# Patient Record
Sex: Male | Born: 1955 | Race: Black or African American | Hispanic: No | Marital: Single | Smoking: Never smoker
Health system: Southern US, Community
[De-identification: ages and names within clinical notes are randomized; demographics above are authoritative.]

## PROBLEM LIST (undated history)

## (undated) DIAGNOSIS — I4510 Unspecified right bundle-branch block: Secondary | ICD-10-CM

## (undated) DIAGNOSIS — E119 Type 2 diabetes mellitus without complications: Secondary | ICD-10-CM

## (undated) DIAGNOSIS — E041 Nontoxic single thyroid nodule: Secondary | ICD-10-CM

## (undated) DIAGNOSIS — I10 Essential (primary) hypertension: Secondary | ICD-10-CM

## (undated) DIAGNOSIS — K5731 Diverticulosis of large intestine without perforation or abscess with bleeding: Secondary | ICD-10-CM

## (undated) DIAGNOSIS — D125 Benign neoplasm of sigmoid colon: Secondary | ICD-10-CM

## (undated) DIAGNOSIS — E785 Hyperlipidemia, unspecified: Secondary | ICD-10-CM

## (undated) DIAGNOSIS — K2100 Gastro-esophageal reflux disease with esophagitis, without bleeding: Secondary | ICD-10-CM

## (undated) DIAGNOSIS — K222 Esophageal obstruction: Secondary | ICD-10-CM

## (undated) HISTORY — DX: Unspecified right bundle-branch block: I45.10

## (undated) HISTORY — DX: Diverticulosis of large intestine without perforation or abscess with bleeding: K57.31

## (undated) HISTORY — PX: MELANOMA EXCISION: SHX5266

## (undated) HISTORY — DX: Hyperlipidemia, unspecified: E78.5

## (undated) HISTORY — DX: Esophageal obstruction: K22.2

## (undated) HISTORY — PX: WISDOM TOOTH EXTRACTION: SHX21

## (undated) HISTORY — DX: Benign neoplasm of sigmoid colon: D12.5

## (undated) HISTORY — DX: Gastro-esophageal reflux disease with esophagitis, without bleeding: K21.00

## (undated) HISTORY — DX: Type 2 diabetes mellitus without complications: E11.9

## (undated) HISTORY — DX: Nontoxic single thyroid nodule: E04.1

## (undated) HISTORY — DX: Essential (primary) hypertension: I10

---

## 2010-03-10 ENCOUNTER — Other Ambulatory Visit: Payer: Self-pay | Admitting: Family Medicine

## 2010-03-10 DIAGNOSIS — E0789 Other specified disorders of thyroid: Secondary | ICD-10-CM

## 2010-03-13 ENCOUNTER — Other Ambulatory Visit (HOSPITAL_COMMUNITY): Payer: Self-pay

## 2010-03-18 ENCOUNTER — Ambulatory Visit (HOSPITAL_COMMUNITY)
Admission: RE | Admit: 2010-03-18 | Discharge: 2010-03-18 | Disposition: A | Discharge status: Home / Self Care | Payer: Commercial Managed Care - PPO | Source: Ambulatory Visit | Attending: Family Medicine | Admitting: Family Medicine

## 2010-03-18 DIAGNOSIS — E0789 Other specified disorders of thyroid: Secondary | ICD-10-CM

## 2010-03-18 DIAGNOSIS — E041 Nontoxic single thyroid nodule: Secondary | ICD-10-CM | POA: Insufficient documentation

## 2010-03-18 DIAGNOSIS — E049 Nontoxic goiter, unspecified: Secondary | ICD-10-CM | POA: Insufficient documentation

## 2011-06-02 ENCOUNTER — Encounter: Payer: Self-pay | Admitting: Gastroenterology

## 2011-06-11 ENCOUNTER — Ambulatory Visit: Payer: Commercial Managed Care - PPO | Admitting: Gastroenterology

## 2011-06-18 ENCOUNTER — Encounter: Payer: Self-pay | Admitting: *Deleted

## 2011-06-22 ENCOUNTER — Ambulatory Visit (INDEPENDENT_AMBULATORY_CARE_PROVIDER_SITE_OTHER): Payer: Commercial Managed Care - PPO | Admitting: Gastroenterology

## 2011-06-22 ENCOUNTER — Encounter: Payer: Self-pay | Admitting: Gastroenterology

## 2011-06-22 VITALS — BP 134/80 | HR 64 | Ht 65.0 in | Wt 165.0 lb

## 2011-06-22 DIAGNOSIS — E119 Type 2 diabetes mellitus without complications: Secondary | ICD-10-CM

## 2011-06-22 DIAGNOSIS — R1310 Dysphagia, unspecified: Secondary | ICD-10-CM

## 2011-06-22 DIAGNOSIS — Z1211 Encounter for screening for malignant neoplasm of colon: Secondary | ICD-10-CM

## 2011-06-22 MED ORDER — PEG-KCL-NACL-NASULF-NA ASC-C 100 G PO SOLR: 1.0000 | Freq: Once | ORAL | Status: DC

## 2011-06-22 NOTE — Progress Notes (Signed)
History of Present Illness:  This is a very nice 56 year old African American male employee of [employer]. He is referred today because of a long history of intermittent solid food dysphagia located in his upper esophageal area with occasional emesis of food. He has not had an obstruction that has required emergency room visit. The patient denies acid reflux symptoms, or any systemic symptomatology such as weight loss, fever, chills, oral stomatitis or multiple allergies. The patient has never had endoscopy or barium swallow. He denies lower gastrointestinal symptoms, specifically melena or hematochezia, and apparently had a recent rectal exam which showed a slightly enlarged prostate, but guaiac-negative stool. He follows a regular diet without any specific food intolerances. Family history is noncontributory.  I have reviewed this patient's present history, medical and surgical past history, allergies and medications.     ROS: The remainder of the 10 point ROS is negative     Physical Exam: Blood pressure 134/80, pulse 64 and regular, weight 165 pounds and BMI 27.46. General well developed well nourished patient in no acute distress, appearing his stated age Eyes PERRLA, no icterus, fundoscopic exam per opthamologist Skin no lesions noted Neck supple, no adenopathy, no thyroid enlargement, no tenderness Chest clear to percussion and auscultation Heart no significant murmurs, gallops or rubs noted Abdomen no hepatosplenomegaly masses or tenderness, BS normal.  Extremities no acute joint lesions, edema, phlebitis or evidence of cellulitis. Neurologic patient oriented x 3, cranial nerves intact, no focal neurologic deficits noted. Psychological mental status normal and normal affect.  Assessment and plan: Probable Schatzki's ring in the distal esophagus with intermittent solid food dysphagia. Other considerations would be occult chronic acid reflux with secondary peptic stricture, versus  neuromuscular esophageal motility disorder such as achalasia. I have scheduled him for endoscopic exam initially, and he may need esophageal manometry and barium swallow. Also at the time of his endoscopy we'll perform screening colonoscopy because of his age and lack of previous procedures. I've explained propofol sedation to this patient and the risk and benefits of these procedures including possible esophageal dilatation. He does have adult onset diabetes, hypertension, and we will adjust his medicines appropriately for these procedures.  No diagnosis found.

## 2011-06-22 NOTE — Patient Instructions (Signed)
Colonoscopy A colonoscopy is an exam to evaluate your entire colon. In this exam, your colon is cleansed. A long fiberoptic tube is inserted through your rectum and into your colon. The fiberoptic scope (endoscope) is a long bundle of enclosed and very flexible fibers. These fibers transmit light to the area examined and send images from that area to your caregiver. Discomfort is usually minimal. You may be given a drug to help you sleep (sedative) during or prior to the procedure. This exam helps to detect lumps (tumors), polyps, inflammation, and areas of bleeding. Your caregiver may also take a small piece of tissue (biopsy) that will be examined under a microscope. LET YOUR CAREGIVER KNOW ABOUT:   Allergies to food or medicine.   Medicines taken, including vitamins, herbs, eyedrops, over-the-counter medicines, and creams.   Use of steroids (by mouth or creams).   Previous problems with anesthetics or numbing medicines.   History of bleeding problems or blood clots.   Previous surgery.   Other health problems, including diabetes and kidney problems.   Possibility of pregnancy, if this applies.  BEFORE THE PROCEDURE   A clear liquid diet may be required for 2 days before the exam.   Ask your caregiver about changing or stopping your regular medications.   Liquid injections (enemas) or laxatives may be required.   A large amount of electrolyte solution may be given to you to drink over a short period of time. This solution is used to clean out your colon.   You should be present 60 minutes prior to your procedure or as directed by your caregiver.  AFTER THE PROCEDURE   If you received a sedative or pain relieving medication, you will need to arrange for someone to drive you home.   Occasionally, there is a little blood passed with the first bowel movement. Do not be concerned.  FINDING OUT THE RESULTS OF YOUR TEST Not all test results are available during your visit. If your test  results are not back during the visit, make an appointment with your caregiver to find out the results. Do not assume everything is normal if you have not heard from your caregiver or the medical facility. It is important for you to follow up on all of your test results. HOME CARE INSTRUCTIONS   It is not unusual to pass moderate amounts of gas and experience mild abdominal cramping following the procedure. This is due to air being used to inflate your colon during the exam. Walking or a warm pack on your belly (abdomen) may help.   You may resume all normal meals and activities after sedatives and medicines have worn off.   Only take over-the-counter or prescription medicines for pain, discomfort, or fever as directed by your caregiver. Do not use aspirin or blood thinners if a biopsy was taken. Consult your caregiver for medicine usage if biopsies were taken.  SEEK IMMEDIATE MEDICAL CARE IF:   You have a fever.   You pass large blood clots or fill a toilet with blood following the procedure. This may also occur 10 to 14 days following the procedure. This is more likely if a biopsy was taken.   You develop abdominal pain that keeps getting worse and cannot be relieved with medicine.  Document Released: 01/16/2000 Document Revised: 01/07/2011 Document Reviewed: 08/31/2007 ExitCare Patient Information 2012 [employer]  Upper GI Endoscopy Upper GI endoscopy means using a flexible scope to look at the esophagus, stomach, and upper small bowel. This is   done to make a diagnosis in people with heartburn, abdominal pain, or abnormal bleeding. Sometimes an endoscope is needed to remove foreign bodies or food that become stuck in the esophagus; it can also be used to take biopsy samples. For the best results, do not eat or drink for 8 hours before having your upper endoscopy.  To perform the endoscopy, you will probably be sedated and your throat will be numbed with a special spray. The endoscope is  then slowly passed down your throat (this will not interfere with your breathing). An endoscopy exam takes 15 to 30 minutes to complete and there is no real pain. Patients rarely remember much about the procedure. The results of the test may take several days if a biopsy or other test is taken.  You may have a sore throat after an endoscopy exam. Serious complications are very rare. Stick to liquids and soft foods until your pain is better. Do not drive a car or operate any dangerous equipment for at least 24 hours after being sedated. SEEK IMMEDIATE MEDICAL CARE IF:   You have severe throat pain.   You have shortness of breath.   You have bleeding problems.   You have a fever.   You have difficulty recovering from your sedation.  Document Released: 02/26/2004 Document Revised: 01/07/2011 Document Reviewed: 01/21/2008 ExitCare Patient Information 2012 [employer]. 

## 2011-07-19 ENCOUNTER — Telehealth: Payer: Self-pay | Admitting: Gastroenterology

## 2011-07-19 NOTE — Telephone Encounter (Signed)
Pt informed that Movi-Prep re-sent to [facility] per his request.

## 2011-07-20 MED ORDER — PEG-KCL-NACL-NASULF-NA ASC-C 100 G PO SOLR
1.0000 | Freq: Once | ORAL | Status: DC
Start: 2011-07-19 — End: 2011-07-21

## 2011-07-21 ENCOUNTER — Encounter: Payer: Self-pay | Admitting: Gastroenterology

## 2011-07-21 ENCOUNTER — Ambulatory Visit (AMBULATORY_SURGERY_CENTER): Payer: Commercial Managed Care - PPO | Admitting: Gastroenterology

## 2011-07-21 VITALS — BP 135/88 | HR 59 | Temp 96.2°F | Resp 16 | Ht 65.0 in | Wt 165.0 lb

## 2011-07-21 DIAGNOSIS — Q391 Atresia of esophagus with tracheo-esophageal fistula: Secondary | ICD-10-CM

## 2011-07-21 DIAGNOSIS — Z1211 Encounter for screening for malignant neoplasm of colon: Secondary | ICD-10-CM

## 2011-07-21 DIAGNOSIS — K222 Esophageal obstruction: Secondary | ICD-10-CM

## 2011-07-21 DIAGNOSIS — R1310 Dysphagia, unspecified: Secondary | ICD-10-CM

## 2011-07-21 MED ORDER — SODIUM CHLORIDE 0.9 % IV SOLN: 500.0000 mL | INTRAVENOUS | Status: DC

## 2011-07-21 NOTE — Op Note (Signed)
[facility] [address] [place], [place]  [address]  ENDOSCOPY PROCEDURE REPORT  PATIENT:  [name], [name]  MR#:  [id number] BIRTHDATE:  May 08, 1955, 55 yrs. old  GENDER:  male  ENDOSCOPIST:  Vania Rea. Jarold Motto, MD, [facility] Referred by:  PROCEDURE DATE:  07/21/2011 PROCEDURE:  EGD, diagnostic 43235, Maloney Dilation of Esophagus ASA CLASS:  Class II INDICATIONS:  intermittent solid food dysphagia.  MEDICATIONS:   There was residual sedation effect present from prior procedure., propofol (Diprivan) 200 mg IV TOPICAL ANESTHETIC:  DESCRIPTION OF PROCEDURE:   After the risks and benefits of the procedure were explained, informed consent was obtained.  The LB-GIF Q180 [id number] endoscope was introduced through the mouth and advanced to the second portion of the duodenum.  The instrument was slowly withdrawn as the mucosa was fully examined. <<PROCEDUREIMAGES>>  inlet patch in the proximal esophagus.  A Schatzki's ring was found in the distal esophagus. see PICTURES.DILATED #67F MALONEY DILATOR,NO HEME OR PAIN  Otherwise the examination was normal. RING AT GE JUNCTION,SEE PICTURES.  The scope was then withdrawn from the patient and the procedure completed.  COMPLICATIONS:  None  ENDOSCOPIC IMPRESSION: 1) Inlet patch in the proximal esophagus 2) Schatzki's ring in the distal esophagus 3) Otherwise normal examination RECOMMENDATIONS: WE WILL FOLLOW AS NEEDED,IF DYSPHAGIA PERSISTS,WOULD RX. A DAILY PPI PER "INLE POUCH" IN PROXIMAL ESOPHAGUS,THESE CAN PRODUCE ACID AND CAUSE CRICOPHARYNGEAL PROBLEMS.  ______________________________ Vania Rea. Jarold Motto, MD, [facility]  CC:  Rudi Heap, MD  n. Rosalie Doctor:   Vania Rea. Sheli Dorin at 07/21/2011 03:21 PM  Lisbeth Ply, [phone]

## 2011-07-21 NOTE — Progress Notes (Signed)
Patient did not experience any of the following events: a burn prior to discharge; a fall within the facility; wrong site/side/patient/procedure/implant event; or a hospital transfer or hospital admission upon discharge from the facility. (G8907) Patient did not have preoperative order for IV antibiotic SSI prophylaxis. (G8918)  

## 2011-07-21 NOTE — Patient Instructions (Addendum)
YOU HAD AN ENDOSCOPIC PROCEDURE TODAY AT THE  ENDOSCOPY CENTER: Refer to the procedure report that was given to you for any specific questions about what was found during the examination.  If the procedure report does not answer your questions, please call your gastroenterologist to clarify.  If you requested that your care partner not be given the details of your procedure findings, then the procedure report has been included in a sealed envelope for you to review at your convenience later.  YOU SHOULD EXPECT: Some feelings of bloating in the abdomen. Passage of more gas than usual.  Walking can help get rid of the air that was put into your GI tract during the procedure and reduce the bloating. If you had a lower endoscopy (such as a colonoscopy or flexible sigmoidoscopy) you may notice spotting of blood in your stool or on the toilet paper. If you underwent a bowel prep for your procedure, then you may not have a normal bowel movement for a few days.  DIET: Clear liquids for one hour then soft foods for the rest of today.  Drink plenty of fluids but you should avoid alcoholic beverages for 24 hours.  ACTIVITY: Your care partner should take you home directly after the procedure.  You should plan to take it easy, moving slowly for the rest of the day.  You can resume normal activity the day after the procedure however you should NOT DRIVE or use heavy machinery for 24 hours (because of the sedation medicines used during the test).    SYMPTOMS TO REPORT IMMEDIATELY: A gastroenterologist can be reached at any hour.  During normal business hours, 8:30 AM to 5:00 PM Monday through Friday, call [phone].  After hours and on weekends, please call the GI answering service at [phone] who will take a message and have the physician on call contact you.   Following lower endoscopy (colonoscopy or flexible sigmoidoscopy):  Excessive amounts of blood in the stool  Significant tenderness or  worsening of abdominal pains  Swelling of the abdomen that is new, acute  Fever of 100F or higher  Following upper endoscopy (EGD)  Vomiting of blood or coffee ground material  New chest pain or pain under the shoulder blades  Painful or persistently difficult swallowing  New shortness of breath  Fever of 100F or higher  Black, tarry-looking stools  FOLLOW UP: If any biopsies were taken you will be contacted by phone or by letter within the next 1-3 weeks.  Call your gastroenterologist if you have not heard about the biopsies in 3 weeks.  Our staff will call the home number listed on your records the next business day following your procedure to check on you and address any questions or concerns that you may have at that time regarding the information given to you following your procedure. This is a courtesy call and so if there is no answer at the home number and we have not heard from you through the emergency physician on call, we will assume that you have returned to your regular daily activities without incident.  SIGNATURES/CONFIDENTIALITY: You and/or your care partner have signed paperwork which will be entered into your electronic medical record.  These signatures attest to the fact that that the information above on your After Visit Summary has been reviewed and is understood.  Full responsibility of the confidentiality of this discharge information lies with you and/or your care-partner.

## 2011-07-21 NOTE — Op Note (Signed)
[facility] [address] [place], [place]  [address]  COLONOSCOPY PROCEDURE REPORT  PATIENT:  [name], Darrell  MR#:  [id number] BIRTHDATE:  February 27, 1955, 55 yrs. old  GENDER:  male ENDOSCOPIST:  Vania Rea. Jarold Motto, MD, [facility] REF. BY: PROCEDURE DATE:  07/21/2011 PROCEDURE:  Diagnostic Colonoscopy ASA CLASS:  Class II INDICATIONS:  Routine Risk Screening MEDICATIONS:   propofol (Diprivan) 200 mg IV  DESCRIPTION OF PROCEDURE:   After the risks benefits and alternatives of the procedure were thoroughly explained, informed consent was obtained.  Digital rectal exam was performed and revealed no abnormalities.   The LB CF-H180AL [id number] endoscope was introduced through the anus and advanced to the cecum, which was identified by both the appendix and ileocecal valve, without limitations.  The quality of the prep was excellent, using MoviPrep.  The instrument was then slowly withdrawn as the colon was fully examined. <<PROCEDUREIMAGES>>  FINDINGS:  Scattered diverticula were found in the sigmoid colon. No polyps or cancers were seen.  This was otherwise a normal examination of the colon.   Retroflexed views in the rectum revealed no abnormalities.    The time to cecum =  minutes. The scope was then withdrawn in  minutes from the cecum and the procedure completed. COMPLICATIONS:  None ENDOSCOPIC IMPRESSION: 1) Diverticula, scattered in the sigmoid colon 2) No polyps or cancers 3) Otherwise normal examination RECOMMENDATIONS: 1) Continue current colorectal screening recommendations for "routine risk" patients with a repeat colonoscopy in 10 years. REPEAT EXAM:  No  ______________________________ Vania Rea. Jarold Motto, MD, Darrell [name]  CC:  Darrell Heap, MD  n. Rosalie Doctor:   Vania Rea. Brinsley Wence at 07/21/2011 03:12 PM  Lisbeth Ply, [phone]

## 2011-07-22 ENCOUNTER — Telehealth: Payer: Self-pay

## 2011-07-22 NOTE — Telephone Encounter (Signed)
Left message on answering machine. 

## 2014-01-10 ENCOUNTER — Other Ambulatory Visit: Payer: Self-pay | Admitting: Pharmacist

## 2014-01-10 MED ORDER — METFORMIN HCL 500 MG PO TABS: 500.0000 mg | ORAL_TABLET | Freq: Two times a day (BID) | ORAL | Status: DC

## 2014-01-10 NOTE — Telephone Encounter (Signed)
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  BG checked by office staff showed BG in the 500's.   Urine checked and was positive for glucose but negative for ketones.  Patient was given 10 units of Novolog insulin and BG rechecked.  Patient states that he has not been taking Janumet because he could not get samples.  He has been taking Januvia 100mg  daily.  Rx sent to [facility] for patient for metformin 500mg  1 tablet bid.

## 2014-01-11 ENCOUNTER — Other Ambulatory Visit (INDEPENDENT_AMBULATORY_CARE_PROVIDER_SITE_OTHER): Payer: Self-pay

## 2014-01-11 ENCOUNTER — Other Ambulatory Visit: Payer: Self-pay | Admitting: Pharmacist

## 2014-01-11 DIAGNOSIS — E1165 Type 2 diabetes mellitus with hyperglycemia: Secondary | ICD-10-CM

## 2014-01-11 NOTE — Progress Notes (Signed)
Lab only 

## 2014-01-11 NOTE — Progress Notes (Signed)
Called patient about BG - his reading was down this morning to 325 (was over 500 yesterday).   Patient will come in today for BMET.   He has restarted metformin and is continuing Januvia 100mg  daily.

## 2014-01-12 LAB — BMP8+EGFR
BUN/Creatinine Ratio: 11 (ref 9–20)
BUN: 10 mg/dL (ref 6–24)
CALCIUM: 10.1 mg/dL (ref 8.7–10.2)
CO2: 27 mmol/L (ref 18–29)
CREATININE: 0.93 mg/dL (ref 0.76–1.27)
Chloride: 95 mmol/L — ABNORMAL LOW (ref 97–108)
GFR calc Af Amer: 106 mL/min/{1.73_m2} (ref >59)
GFR, EST NON AFRICAN AMERICAN: 91 mL/min/{1.73_m2} (ref >59)
GLUCOSE: 356 mg/dL — AB (ref 65–99)
Potassium: 4.1 mmol/L (ref 3.5–5.2)
Sodium: 139 mmol/L (ref 134–144)

## 2014-04-10 ENCOUNTER — Telehealth: Payer: Self-pay | Admitting: Pharmacist

## 2014-04-10 MED ORDER — METFORMIN HCL 500 MG PO TABS: 500.0000 mg | ORAL_TABLET | Freq: Two times a day (BID) | ORAL | Status: DC

## 2014-04-10 NOTE — Telephone Encounter (Signed)
Rx for metforin sent in.  Patinet reminded to RTC to have BMET and A1c  Checked in next month.

## 2014-06-08 ENCOUNTER — Telehealth: Payer: Self-pay | Admitting: *Deleted

## 2014-06-08 DIAGNOSIS — I10 Essential (primary) hypertension: Secondary | ICD-10-CM

## 2014-06-08 NOTE — Telephone Encounter (Signed)
Pt has been off Bp med - samples of benicar 40 given today  To return in 2 weeks for BP check and to bring readings - if this works ok - [facility] may switch him to generic losartan 100 .  bp today 178 91

## 2014-06-12 ENCOUNTER — Encounter: Payer: Self-pay | Admitting: *Deleted

## 2014-06-21 ENCOUNTER — Other Ambulatory Visit (INDEPENDENT_AMBULATORY_CARE_PROVIDER_SITE_OTHER): Payer: Managed Care, Other (non HMO)

## 2014-06-21 ENCOUNTER — Other Ambulatory Visit: Payer: Self-pay | Admitting: Pharmacist

## 2014-06-21 DIAGNOSIS — E119 Type 2 diabetes mellitus without complications: Secondary | ICD-10-CM | POA: Diagnosis not present

## 2014-06-21 MED ORDER — SITAGLIPTIN PHOSPHATE 100 MG PO TABS: 100.0000 mg | ORAL_TABLET | Freq: Every day | ORAL | Status: DC

## 2014-06-21 MED ORDER — AMLODIPINE-VALSARTAN-HCTZ 5-160-25 MG PO TABS: 1.0000 | ORAL_TABLET | Freq: Every day | ORAL | Status: DC

## 2014-06-21 MED ORDER — METFORMIN HCL 500 MG PO TABS: 500.0000 mg | ORAL_TABLET | Freq: Two times a day (BID) | ORAL | Status: DC

## 2014-06-21 NOTE — Progress Notes (Signed)
Patient's BP still elevated today was 180/78. He previously was taking Tribenzor 40/5/25mg  dialy but we no longer get samples of this.  Last week was started on Benicar 40mg  daily.   No samples of Benicar available today.  Rx sent for generic alternative to Tribenzor (patient has insurance now).  Rx for Valsartan/amlodipine/HCTZ 160/5/25mg  take 1 tablet daily.  Recheck BP in 2 weeks.

## 2014-06-21 NOTE — Addendum Note (Signed)
Addended by: Pollyann Kennedy F on: 06/21/2014 01:58 PM   Modules accepted: Orders

## 2014-06-22 LAB — MICROALBUMIN, URINE: Microalbumin, Urine: 102.8 ug/mL

## 2014-06-22 LAB — HEPATIC FUNCTION PANEL
ALT: 10 IU/L (ref 0–44)
AST: 14 IU/L (ref 0–40)
Albumin: 4.8 g/dL (ref 3.5–5.5)
Alkaline Phosphatase: 41 IU/L (ref 39–117)
Bilirubin Total: 1 mg/dL (ref 0.0–1.2)
Bilirubin, Direct: 0.22 mg/dL (ref 0.00–0.40)
TOTAL PROTEIN: 7.2 g/dL (ref 6.0–8.5)

## 2014-06-22 LAB — BMP8+EGFR
BUN / CREAT RATIO: 11 (ref 9–20)
BUN: 9 mg/dL (ref 6–24)
CALCIUM: 9.4 mg/dL (ref 8.7–10.2)
CHLORIDE: 103 mmol/L (ref 97–108)
CO2: 26 mmol/L (ref 18–29)
Creatinine, Ser: 0.85 mg/dL (ref 0.76–1.27)
GFR calc non Af Amer: 97 mL/min/{1.73_m2} (ref >59)
GFR, EST AFRICAN AMERICAN: 113 mL/min/{1.73_m2} (ref >59)
Glucose: 123 mg/dL — ABNORMAL HIGH (ref 65–99)
Potassium: 3.7 mmol/L (ref 3.5–5.2)
Sodium: 146 mmol/L — ABNORMAL HIGH (ref 134–144)

## 2014-06-22 LAB — LIPID PANEL
CHOL/HDL RATIO: 4.5 ratio (ref 0.0–5.0)
Cholesterol, Total: 241 mg/dL — ABNORMAL HIGH (ref 100–199)
HDL: 54 mg/dL (ref >39)
LDL CALC: 170 mg/dL — AB (ref 0–99)
Triglycerides: 83 mg/dL (ref 0–149)
VLDL CHOLESTEROL CAL: 17 mg/dL (ref 5–40)

## 2014-06-22 LAB — HEMOGLOBIN A1C
Est. average glucose Bld gHb Est-mCnc: 114 mg/dL
Hgb A1c MFr Bld: 5.6 % (ref 4.8–5.6)

## 2014-07-03 ENCOUNTER — Encounter: Payer: Self-pay | Admitting: *Deleted

## 2014-08-26 ENCOUNTER — Other Ambulatory Visit: Payer: Self-pay | Admitting: Pharmacist

## 2014-08-26 MED ORDER — AMLODIPINE-VALSARTAN-HCTZ 5-160-25 MG PO TABS: 1.0000 | ORAL_TABLET | Freq: Every day | ORAL | Status: DC

## 2014-09-05 ENCOUNTER — Other Ambulatory Visit: Payer: Self-pay

## 2014-12-30 ENCOUNTER — Other Ambulatory Visit: Payer: Self-pay | Admitting: Pharmacist

## 2014-12-31 MED ORDER — SITAGLIP PHOS-METFORMIN HCL ER 100-1000 MG PO TB24: 1.0000 | ORAL_TABLET | Freq: Every day | ORAL | Status: DC

## 2014-12-31 MED ORDER — AMLODIPINE-VALSARTAN-HCTZ 5-160-25 MG PO TABS: 1.0000 | ORAL_TABLET | Freq: Every day | ORAL | Status: DC

## 2014-12-31 NOTE — Progress Notes (Signed)
Received written request from patient to send prescriptions to [facility].  He states that he has been out of metformin and amlodipone/valsartan HCTZ for 2 months because his insurance is requiring him to use mail order and [facility] was unable to contact our office.   Patient is due labs and follow up with Dr Laurance Flatten. Patient usually works Saturdays in our office - Dr Laurance Flatten is working Saturday 12/17.  Verified with Georgina Pillion that Dr Laurance Flatten will see Darrell [name] that day.  Will do labs and check BP.   Ok'd with Dr Laurance Flatten to send in prescriptions for 3 months to patients mail order pharmacy.

## 2015-01-02 ENCOUNTER — Other Ambulatory Visit: Payer: Self-pay | Admitting: Pharmacist

## 2015-01-02 MED ORDER — METFORMIN HCL 500 MG PO TABS: 500.0000 mg | ORAL_TABLET | Freq: Two times a day (BID) | ORAL | Status: DC

## 2015-01-02 MED ORDER — SITAGLIPTIN PHOSPHATE 100 MG PO TABS: 100.0000 mg | ORAL_TABLET | Freq: Every day | ORAL | Status: DC

## 2015-01-02 NOTE — Progress Notes (Signed)
Called [facility] to see if they could use free 30 day trial card and was told that they do not process those cards.  Also was informed that pateint's insurance needs PA for Janumet but will cover Januvia and metformin separately.  Therefore separated them out and new rx's called to [facility].

## 2015-01-20 ENCOUNTER — Other Ambulatory Visit: Payer: Self-pay | Admitting: *Deleted

## 2015-01-20 ENCOUNTER — Other Ambulatory Visit: Payer: Self-pay | Admitting: Family Medicine

## 2015-01-20 DIAGNOSIS — E118 Type 2 diabetes mellitus with unspecified complications: Secondary | ICD-10-CM

## 2015-01-20 DIAGNOSIS — I10 Essential (primary) hypertension: Secondary | ICD-10-CM

## 2015-01-20 DIAGNOSIS — Z0000 Encounter for general adult medical examination without abnormal findings: Secondary | ICD-10-CM

## 2015-01-20 NOTE — Addendum Note (Signed)
Addended by: Pollyann Kennedy F on: 01/20/2015 03:48 PM   Modules accepted: Orders

## 2015-01-21 LAB — BMP8+EGFR
BUN / CREAT RATIO: 15 (ref 9–20)
BUN: 12 mg/dL (ref 6–24)
CALCIUM: 9.8 mg/dL (ref 8.7–10.2)
CHLORIDE: 100 mmol/L (ref 96–106)
CO2: 25 mmol/L (ref 18–29)
Creatinine, Ser: 0.79 mg/dL (ref 0.76–1.27)
GFR, EST AFRICAN AMERICAN: 115 mL/min/{1.73_m2} (ref >59)
GFR, EST NON AFRICAN AMERICAN: 100 mL/min/{1.73_m2} (ref >59)
Glucose: 161 mg/dL — ABNORMAL HIGH (ref 65–99)
POTASSIUM: 3.3 mmol/L — AB (ref 3.5–5.2)
Sodium: 144 mmol/L (ref 134–144)

## 2015-01-21 LAB — CBC WITH DIFFERENTIAL/PLATELET
BASOS ABS: 0 10*3/uL (ref 0.0–0.2)
BASOS: 0 %
EOS (ABSOLUTE): 0.1 10*3/uL (ref 0.0–0.4)
Eos: 1 %
HEMOGLOBIN: 14.2 g/dL (ref 12.6–17.7)
Hematocrit: 41.9 % (ref 37.5–51.0)
IMMATURE GRANS (ABS): 0 10*3/uL (ref 0.0–0.1)
Immature Granulocytes: 0 %
LYMPHS ABS: 2.8 10*3/uL (ref 0.7–3.1)
LYMPHS: 33 %
MCH: 31.4 pg (ref 26.6–33.0)
MCHC: 33.9 g/dL (ref 31.5–35.7)
MCV: 93 fL (ref 79–97)
MONOCYTES: 6 %
Monocytes Absolute: 0.5 10*3/uL (ref 0.1–0.9)
NEUTROS ABS: 5 10*3/uL (ref 1.4–7.0)
Neutrophils: 60 %
Platelets: 220 10*3/uL (ref 150–379)
RBC: 4.52 x10E6/uL (ref 4.14–5.80)
RDW: 13 % (ref 12.3–15.4)
WBC: 8.5 10*3/uL (ref 3.4–10.8)

## 2015-01-21 LAB — LIPID PANEL
CHOLESTEROL TOTAL: 235 mg/dL — AB (ref 100–199)
Chol/HDL Ratio: 5.7 ratio units — ABNORMAL HIGH (ref 0.0–5.0)
HDL: 41 mg/dL (ref >39)
LDL CALC: 151 mg/dL — AB (ref 0–99)
Triglycerides: 214 mg/dL — ABNORMAL HIGH (ref 0–149)
VLDL CHOLESTEROL CAL: 43 mg/dL — AB (ref 5–40)

## 2015-01-21 LAB — HEPATIC FUNCTION PANEL
ALBUMIN: 4.7 g/dL (ref 3.5–5.5)
ALT: 15 IU/L (ref 0–44)
AST: 13 IU/L (ref 0–40)
Alkaline Phosphatase: 65 IU/L (ref 39–117)
BILIRUBIN TOTAL: 1 mg/dL (ref 0.0–1.2)
Bilirubin, Direct: 0.19 mg/dL (ref 0.00–0.40)
TOTAL PROTEIN: 7.7 g/dL (ref 6.0–8.5)

## 2015-01-21 LAB — HEMOGLOBIN A1C
Est. average glucose Bld gHb Est-mCnc: 160 mg/dL
HEMOGLOBIN A1C: 7.2 % — AB (ref 4.8–5.6)

## 2015-01-21 LAB — VITAMIN D 25 HYDROXY (VIT D DEFICIENCY, FRACTURES): Vit D, 25-Hydroxy: 16.2 ng/mL — ABNORMAL LOW (ref 30.0–100.0)

## 2015-01-30 ENCOUNTER — Ambulatory Visit (INDEPENDENT_AMBULATORY_CARE_PROVIDER_SITE_OTHER): Payer: Managed Care, Other (non HMO) | Admitting: Family Medicine

## 2015-01-30 ENCOUNTER — Encounter: Payer: Self-pay | Admitting: Family Medicine

## 2015-01-30 ENCOUNTER — Ambulatory Visit (INDEPENDENT_AMBULATORY_CARE_PROVIDER_SITE_OTHER): Payer: Managed Care, Other (non HMO)

## 2015-01-30 VITALS — BP 168/100 | HR 82 | Temp 98.5°F | Ht 65.0 in | Wt 163.0 lb

## 2015-01-30 DIAGNOSIS — Z0000 Encounter for general adult medical examination without abnormal findings: Secondary | ICD-10-CM | POA: Diagnosis not present

## 2015-01-30 DIAGNOSIS — N400 Enlarged prostate without lower urinary tract symptoms: Secondary | ICD-10-CM

## 2015-01-30 DIAGNOSIS — E118 Type 2 diabetes mellitus with unspecified complications: Secondary | ICD-10-CM

## 2015-01-30 DIAGNOSIS — E1169 Type 2 diabetes mellitus with other specified complication: Secondary | ICD-10-CM | POA: Insufficient documentation

## 2015-01-30 DIAGNOSIS — R9431 Abnormal electrocardiogram [ECG] [EKG]: Secondary | ICD-10-CM

## 2015-01-30 DIAGNOSIS — I10 Essential (primary) hypertension: Secondary | ICD-10-CM | POA: Diagnosis not present

## 2015-01-30 DIAGNOSIS — E785 Hyperlipidemia, unspecified: Secondary | ICD-10-CM | POA: Insufficient documentation

## 2015-01-30 DIAGNOSIS — E876 Hypokalemia: Secondary | ICD-10-CM | POA: Insufficient documentation

## 2015-01-30 HISTORY — DX: Type 2 diabetes mellitus with other specified complication: E11.69

## 2015-01-30 HISTORY — DX: Essential (primary) hypertension: I10

## 2015-01-30 LAB — POCT URINALYSIS DIPSTICK
Bilirubin, UA: NEGATIVE
Ketones, UA: NEGATIVE
Leukocytes, UA: NEGATIVE
Nitrite, UA: NEGATIVE
RBC UA: NEGATIVE
UROBILINOGEN UA: NEGATIVE
pH, UA: 5

## 2015-01-30 LAB — POCT UA - MICROSCOPIC ONLY
Casts, Ur, LPF, POC: NEGATIVE
Crystals, Ur, HPF, POC: NEGATIVE
Epithelial cells, urine per micros: NEGATIVE
YEAST UA: NEGATIVE

## 2015-01-30 MED ORDER — VITAMIN D (ERGOCALCIFEROL) 1.25 MG (50000 UNIT) PO CAPS: 50000.0000 [IU] | ORAL_CAPSULE | ORAL | Status: DC

## 2015-01-30 NOTE — Addendum Note (Signed)
Addended by: Zannie Cove on: 01/30/2015 03:56 PM   Modules accepted: Orders

## 2015-01-30 NOTE — Progress Notes (Signed)
Subjective:    Patient ID: Darrell [name], male    DOB: 1955/05/25, 59 y.o.   MRN: [id number]  HPI Patient is here today for annual wellness exam and follow up of chronic medical problems which includes diabetes and hypertension. He is taking not medications regularly, H estates that they have been re-ordered and he has not received them yet.This patient has no complaints as usual. He is sometimes not very compliant with his medication mostly because of financial issues. The patient denies chest pain shortness of breath   and heartburn indigestion and nausea and vomiting diarrhea blood in the stool or black tarry bowel movements. He does have trouble swallowing especially breads and starchy-type materials. He has had an endoscopy in the past and may need to have another endoscopy with dilatation. The family history is positive for breast cancer heart attack and diabetes.      There are no active problems to display for this patient.  Outpatient Encounter Prescriptions as of 01/30/2015  Medication Sig  . Amlodipine-Valsartan-HCTZ 5-160-25 MG TABS Take 1 tablet by mouth daily. For blood pressure  . metFORMIN (GLUCOPHAGE) 500 MG tablet Take 1 tablet (500 mg total) by mouth 2 (two) times daily with a meal.  . Multiple Vitamins-Minerals (MULTIVITAMIN PO) Take 1 tablet by mouth daily.  . Olmesartan-Amlodipine-HCTZ (TRIBENZOR) 40-5-25 MG TABS Take 1 tablet by mouth daily.  . Pitavastatin Calcium (LIVALO) 2 MG TABS Take 1 tablet by mouth 2 (two) times daily.  . sitaGLIPtin (JANUVIA) 100 MG tablet Take 1 tablet (100 mg total) by mouth daily.   No facility-administered encounter medications on file as of 01/30/2015.      Review of Systems  Constitutional: Negative.   HENT: Negative.   Eyes: Negative.   Respiratory: Negative.   Cardiovascular: Negative.   Gastrointestinal: Negative.   Endocrine: Negative.   Genitourinary: Negative.   Musculoskeletal: Positive for arthralgias (low back pain -  yesterday - better today).  Skin: Negative.   Allergic/Immunologic: Negative.   Neurological: Negative.   Hematological: Negative.   Psychiatric/Behavioral: Negative.        Objective:   Physical Exam  Constitutional: He is oriented to person, place, and time. He appears well-developed and well-nourished. No distress.  HENT:  Head: Normocephalic and atraumatic.  Right Ear: External ear normal.  Left Ear: External ear normal.  Mouth/Throat: Oropharynx is clear and moist. No oropharyngeal exudate.  Nasal congestion left greater than right  Eyes: Conjunctivae and EOM are normal. Pupils are equal, round, and reactive to light. Right eye exhibits no discharge. Left eye exhibits no discharge. No scleral icterus.  Neck: Normal range of motion. Neck supple. No thyromegaly present.  Cardiovascular: Normal rate, regular rhythm, normal heart sounds and intact distal pulses.   No murmur heard. 72/m with a regular rate and rhythm  Pulmonary/Chest: Effort normal and breath sounds normal. No respiratory distress. He has no wheezes. He has no rales. He exhibits no tenderness.  Clear anteriorly and posteriorly without axillary adenopathy  Abdominal: Soft. Bowel sounds are normal. He exhibits no mass. There is no tenderness. There is no rebound and no guarding.  No liver or spleen enlargement and no bruits  Genitourinary: Rectum normal and penis normal.  There were no inguinal hernias and no rectal masses and the external genitalia were within normal limits The prostate is enlarged but soft and smooth  Musculoskeletal: Normal range of motion. He exhibits no edema or tenderness.  Lymphadenopathy:    He has no cervical adenopathy.  Neurological: He is alert and oriented to person, place, and time. He has normal reflexes. No cranial nerve deficit.  Skin: Skin is warm and dry. No rash noted.  Psychiatric: He has a normal mood and affect. His behavior is normal. Judgment and thought content normal.    Nursing note and vitals reviewed.  BP 170/91 mmHg  Pulse 82  Temp(Src) 98.5 F (36.9 C) (Oral)  Ht 5\' 5"  (1.651 m)  Wt 163 lb (73.936 kg)  BMI 27.12 kg/m2  Repeat blood pressure 168/100 sitting left arm large cuff EKG. 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 Annual physical exam -The patient has some changes on his EKG and a visit with the cardiologist will be arranged to further evaluate this. - PSA, total and free - POCT urinalysis dipstick - POCT UA - Microscopic Only - DG Chest 2 View; Future - EKG 12-Lead  2. Essential hypertension -The patient's blood pressure is elevated today because he has not received his medication and he must get started back on this immediately -He will be given a time to come back to have his blood pressure checked in 2-3 weeks - DG Chest 2 View; Future - EKG 12-Lead  3. Type 2 diabetes mellitus with complication, without long-term current use of insulin (HCC) -The hemoglobin A1c was good at 7.2% and the patient should continue with his current medication - EKG 12-Lead  4. Hyperlipidemia -Cholesterol numbers were elevated and he needs to get back on his cholesterol medication and follow-up aggressive therapeutic lifestyle changes - EKG 12-Lead  5. Hypokalemia -The potassium was low and he needs to have a BMP repeated today  6. Type 2 diabetes mellitus with hyperlipidemia ([place]) -Continue treatment  7. BPH (benign prostatic hyperplasia) -He is having no symptoms with this and we will continue to monitor this  8. Nonspecific abnormal electrocardiogram (ECG) (EKG) -Appointment with cardiology for follow-up  No orders of the defined types were placed in this encounter.   Patient Instructions  Continue current medications. Continue good therapeutic lifestyle changes which include good diet and exercise. Fall precautions discussed with  patient. If an FOBT was given today- please return it to our front desk. If you are over 46 years old - you may need Prevnar 42 or the adult Pneumonia vaccine.  **Flu shots are available--- please call and schedule a FLU-CLINIC appointment**  After your visit with [place] today you will receive a survey in the mail or online from [employer] regarding your care with [place]. Please take a moment to fill this out. Your feedback is very important to [place] as you can help [place] better understand your patient needs as well as improve your experience and satisfaction. WE CARE ABOUT YOU!!!   The patient must make all efforts to get started on his blood pressure medicine and his cholesterol medicine from his prescribing company Not keeping the blood pressure under control is putting him at risk for stroke and heart attack He also needs to get on all of his blood sugar medicine and should be monitoring his blood sugars and blood pressures regularly He should continue with aggressive therapeutic lifestyle changes which include his diet and exercise The clinical pharmacists has worked with him to get these medicines and he must call and find out why they have not been sent to him   Arrie Senate MD

## 2015-01-30 NOTE — Patient Instructions (Addendum)
Continue current medications. Continue good therapeutic lifestyle changes which include good diet and exercise. Fall precautions discussed with patient. If an FOBT was given today- please return it to our front desk. If you are over 57 years old - you may need Prevnar 65 or the adult Pneumonia vaccine.  **Flu shots are available--- please call and schedule a FLU-CLINIC appointment**  After your visit with [place] today you will receive a survey in the mail or online from [employer] regarding your care with [place]. Please take a moment to fill this out. Your feedback is very important to [place] as you can help [place] better understand your patient needs as well as improve your experience and satisfaction. WE CARE ABOUT YOU!!!   The patient must make all efforts to get started on his blood pressure medicine and his cholesterol medicine from his prescribing company Not keeping the blood pressure under control is putting him at risk for stroke and heart attack He also needs to get on all of his blood sugar medicine and should be monitoring his blood sugars and blood pressures regularly He should continue with aggressive therapeutic lifestyle changes which include his diet and exercise The clinical pharmacists has worked with him to get these medicines and he must call and find out why they have not been sent to him

## 2015-01-31 LAB — PSA, TOTAL AND FREE
PROSTATE SPECIFIC AG, SERUM: 0.6 ng/mL (ref 0.0–4.0)
PSA FREE PCT: 16.7 %
PSA, Free: 0.1 ng/mL

## 2015-01-31 NOTE — Addendum Note (Signed)
Addended by: [name] on: 01/31/2015 08:44 AM   Modules accepted: Orders

## 2015-02-01 LAB — FECAL OCCULT BLOOD, IMMUNOCHEMICAL: FECAL OCCULT BLD: NEGATIVE

## 2015-02-04 ENCOUNTER — Other Ambulatory Visit: Payer: Self-pay | Admitting: Pharmacist

## 2015-02-05 ENCOUNTER — Other Ambulatory Visit: Payer: Self-pay | Admitting: Pharmacist

## 2015-02-05 MED ORDER — AMLODIPINE-VALSARTAN-HCTZ 5-160-25 MG PO TABS: 1.0000 | ORAL_TABLET | Freq: Every day | ORAL | Status: DC

## 2015-02-05 MED ORDER — METFORMIN HCL 500 MG PO TABS: 500.0000 mg | ORAL_TABLET | Freq: Two times a day (BID) | ORAL | Status: DC

## 2015-02-05 MED ORDER — SITAGLIPTIN PHOSPHATE 100 MG PO TABS: 100.0000 mg | ORAL_TABLET | Freq: Every day | ORAL | Status: DC

## 2015-02-05 NOTE — Telephone Encounter (Signed)
Received text message from patient that he had been out of his medication for 3 days.  He stated [facility] if waiting on authorization / prior authorization from our office.  I have not received any communication from them but last Rxs were sent to [facility] at patient's request not [facility].  Rx's were sent to [facility] this am.

## 2015-04-08 ENCOUNTER — Other Ambulatory Visit: Payer: Self-pay | Admitting: Family Medicine

## 2015-04-30 ENCOUNTER — Other Ambulatory Visit: Payer: Self-pay | Admitting: Family Medicine

## 2015-06-18 ENCOUNTER — Other Ambulatory Visit: Payer: Self-pay | Admitting: Family Medicine

## 2015-07-05 ENCOUNTER — Encounter: Payer: Self-pay | Admitting: Family

## 2015-07-05 ENCOUNTER — Ambulatory Visit (INDEPENDENT_AMBULATORY_CARE_PROVIDER_SITE_OTHER): Payer: Managed Care, Other (non HMO) | Admitting: Family

## 2015-07-05 VITALS — BP 130/79 | HR 77 | Temp 99.1°F | Ht 65.0 in | Wt 158.2 lb

## 2015-07-05 DIAGNOSIS — J069 Acute upper respiratory infection, unspecified: Secondary | ICD-10-CM | POA: Diagnosis not present

## 2015-07-05 MED ORDER — HYDROCODONE-HOMATROPINE 5-1.5 MG/5ML PO SYRP: 5.0000 mL | ORAL_SOLUTION | Freq: Three times a day (TID) | ORAL | Status: DC | PRN

## 2015-07-05 MED ORDER — BENZONATATE 200 MG PO CAPS: 200.0000 mg | ORAL_CAPSULE | Freq: Three times a day (TID) | ORAL | Status: DC | PRN

## 2015-07-05 MED ORDER — AZITHROMYCIN 250 MG PO TABS: ORAL_TABLET | ORAL | Status: DC

## 2015-07-05 NOTE — Patient Instructions (Signed)
Acute Bronchitis Bronchitis is inflammation of the airways that extend from the windpipe into the lungs (bronchi). The inflammation often causes mucus to develop. This leads to a cough, which is the most common symptom of bronchitis.  In acute bronchitis, the condition usually develops suddenly and goes away over time, usually in a couple weeks. Smoking, allergies, and asthma can make bronchitis worse. Repeated episodes of bronchitis may cause further lung problems.  CAUSES Acute bronchitis is most often caused by the same virus that causes a cold. The virus can spread from person to person (contagious) through coughing, sneezing, and touching contaminated objects. SIGNS AND SYMPTOMS   Cough.   Fever.   Coughing up mucus.   Body aches.   Chest congestion.   Chills.   Shortness of breath.   Sore throat.  DIAGNOSIS  Acute bronchitis is usually diagnosed through a physical exam. Your health care provider will also ask you questions about your medical history. Tests, such as chest X-rays, are sometimes done to rule out other conditions.  TREATMENT  Acute bronchitis usually goes away in a couple weeks. Oftentimes, no medical treatment is necessary. Medicines are sometimes given for relief of fever or cough. Antibiotic medicines are usually not needed but may be prescribed in certain situations. In some cases, an inhaler may be recommended to help reduce shortness of breath and control the cough. A cool mist vaporizer may also be used to help thin bronchial secretions and make it easier to clear the chest.  HOME CARE INSTRUCTIONS  Get plenty of rest.   Drink enough fluids to keep your urine clear or pale yellow (unless you have a medical condition that requires fluid restriction). Increasing fluids may help thin your respiratory secretions (sputum) and reduce chest congestion, and it will prevent dehydration.   Take medicines only as directed by your health care provider.  If  you were prescribed an antibiotic medicine, finish it all even if you start to feel better.  Avoid smoking and secondhand smoke. Exposure to cigarette smoke or irritating chemicals will make bronchitis worse. If you are a smoker, consider using nicotine gum or skin patches to help control withdrawal symptoms. Quitting smoking will help your lungs heal faster.   Reduce the chances of another bout of acute bronchitis by washing your hands frequently, avoiding people with cold symptoms, and trying not to touch your hands to your mouth, nose, or eyes.   Keep all follow-up visits as directed by your health care provider.  SEEK MEDICAL CARE IF: Your symptoms do not improve after 1 week of treatment.  SEEK IMMEDIATE MEDICAL CARE IF:  You develop an increased fever or chills.   You have chest pain.   You have severe shortness of breath.  You have bloody sputum.   You develop dehydration.  You faint or repeatedly feel like you are going to pass out.  You develop repeated vomiting.  You develop a severe headache. MAKE SURE YOU:   Understand these instructions.  Will watch your condition.  Will get help right away if you are not doing well or get worse.   This information is not intended to replace advice given to you by your health care provider. Make sure you discuss any questions you have with your health care provider.   Document Released: 02/26/2004 Document Revised: 02/08/2014 Document Reviewed: 07/11/2012 Elsevier Interactive Patient Education 2016 [employer]  - Take meds as prescribed - Use a cool mist humidifier  -Use saline nose sprays frequently -Saline   irrigations of the nose can be very helpful if done frequently.  * 4X daily for 1 week*  * Use of a nettie pot can be helpful with this. Follow directions with this* -Force fluids -For any cough or congestion  Use plain Mucinex- regular strength or max strength is fine   * Children- consult with Pharmacist for  dosing -For fever or aces or pains- take tylenol or ibuprofen appropriate for age and weight.  * for fevers greater than 101 orally you may alternate ibuprofen and tylenol every  3 hours. -Throat lozenges if help -New toothbrush in 3 days   Nolen Lindamood, FNP   

## 2015-07-05 NOTE — Progress Notes (Signed)
Subjective:    Patient ID: Darrell [name], male    DOB: [birth year], 60 y.o.   MRN: [id number]  Cough This is a new problem. The current episode started in the past 7 days. The problem has been gradually improving. The problem occurs every few minutes. The cough is non-productive. Associated symptoms include chills, a fever, postnasal drip, rhinorrhea, shortness of breath and wheezing. Pertinent negatives include no ear congestion, ear pain, myalgias, nasal congestion or sore throat. The symptoms are aggravated by lying down. He has tried rest and OTC cough suppressant for the symptoms. The treatment provided mild relief. There is no history of asthma or COPD.      Review of Systems  Constitutional: Positive for fever and chills.  HENT: Positive for postnasal drip and rhinorrhea. Negative for ear pain and sore throat.   Respiratory: Positive for cough, shortness of breath and wheezing.   Musculoskeletal: Negative for myalgias.  All other systems reviewed and are negative.      Objective:   Physical Exam  Constitutional: He is oriented to person, place, and time. He appears well-developed and well-nourished. No distress.  HENT:  Head: Normocephalic.  Right Ear: External ear normal.  Left Ear: External ear normal.  Nose: Mucosal edema and rhinorrhea present.  Mouth/Throat: Oropharynx is clear and moist.  Eyes: Pupils are equal, round, and reactive to light. Right eye exhibits no discharge. Left eye exhibits no discharge.  Neck: Normal range of motion. Neck supple. No thyromegaly present.  Cardiovascular: Normal rate, regular rhythm, normal heart sounds and intact distal pulses.   No murmur heard. Pulmonary/Chest: Effort normal and breath sounds normal. No respiratory distress. He has no wheezes.  intermittent nonproductive   Abdominal: Soft. Bowel sounds are normal. He exhibits no distension. There is no tenderness.  Musculoskeletal: Normal range of motion. He exhibits no edema or  tenderness.  Neurological: He is alert and oriented to person, place, and time. He has normal reflexes. No cranial nerve deficit.  Skin: Skin is warm and dry. No rash noted. No erythema.  Psychiatric: He has a normal mood and affect. His behavior is normal. Judgment and thought content normal.  Vitals reviewed.     BP 130/79 mmHg  Pulse 77  Temp(Src) 99.1 F (37.3 C) (Oral)  Ht 5\' 5"  (1.651 m)  Wt 158 lb 3.2 oz (71.759 kg)  BMI 26.33 kg/m2     Assessment & Plan:  1. Acute upper respiratory infection -- Take meds as prescribed - Use a cool mist humidifier  -Use saline nose sprays frequently -Saline irrigations of the nose can be very helpful if done frequently.  * 4X daily for 1 week*  * Use of a nettie pot can be helpful with this. Follow directions with this* -Force fluids -For any cough or congestion  Use plain Mucinex- regular strength or max strength is fine   * Children- consult with Pharmacist for dosing -For fever or aces or pains- take tylenol or ibuprofen appropriate for age and weight.  * for fevers greater than 101 orally you may alternate ibuprofen and tylenol every  3 hours. -Throat lozenges if help -New toothbrush in 3 days - azithromycin (ZITHROMAX Z-PAK) 250 MG tablet; As directed  Dispense: 1 each; Refill: 0 - benzonatate (TESSALON) 200 MG capsule; Take 1 capsule (200 mg total) by mouth 3 (three) times daily as needed.  Dispense: 30 capsule; Refill: 1 - HYDROcodone-homatropine (HYCODAN) 5-1.5 MG/5ML syrup; Take 5 mLs by mouth every 8 (eight) hours as  needed for cough.  Dispense: 120 mL; Refill: 0  Evelina Dun, FNP

## 2015-10-01 ENCOUNTER — Other Ambulatory Visit: Payer: Self-pay | Admitting: Family Medicine

## 2015-12-20 ENCOUNTER — Ambulatory Visit (INDEPENDENT_AMBULATORY_CARE_PROVIDER_SITE_OTHER): Payer: Managed Care, Other (non HMO)

## 2015-12-20 DIAGNOSIS — Z23 Encounter for immunization: Secondary | ICD-10-CM

## 2016-01-20 ENCOUNTER — Other Ambulatory Visit: Payer: Self-pay | Admitting: Family

## 2016-03-11 ENCOUNTER — Telehealth: Payer: Self-pay | Admitting: Pharmacist

## 2016-03-11 NOTE — Telephone Encounter (Signed)
Patient needs labs and to be seen by PCP.

## 2016-03-12 NOTE — Progress Notes (Signed)
Subjective:  Patient ID: Darrell [name], male    DOB: 1955-03-09  Age: 61 y.o. MRN: [id number]  CC: Follow-up and Dizziness (Occasionally)   HPI Darrell [name] presents for  follow-up of hypertension. Patient has no history of headache chest pain or shortness of breath or recent cough. Patient also denies symptoms of TIA such as numbness weakness lateralizing.. Patient denies side effects from his medication.Ran out recently needs refills sent today.  Patient also  in for follow-up of elevated cholesterol. He has not had his Livalo for some time. Denies side effectsincluding myalgia and arthralgia and nausea. Also in today for liver function testing. Currently no chest pain, shortness of breath or other cardiovascular related symptoms noted.  Follow-up of diabetes. First diagnosed about 6 years. Patient does check blood sugar at home. Readings run between 150 and 157 PP. 99-117 fasting.  Patient denies symptoms such as polyuria, polydipsia, excessive hunger, nausea No significant hypoglycemic spells noted in the recent past. . Medications as noted below. Taking them regularly without complication/adverse reaction being reported today.   Patient reports that he had an accident where he hit his head on the head of the ship any years ago while in the [employer]. He says he got out of the [employer] when he was 27 therefore would've had been at least 33 years ago. There is since that time he's had bouts of feeling off balance. Those last just for a few moments he says when he first stands up History Mung has a past medical history of Dyslipidemia; Essential hypertension, benign; Thyroid cyst; Type 2 diabetes mellitus ([place]); and Vitamin D deficiency.   He has a past surgical history that includes Wisdom tooth extraction and Melanoma excision.   His family history includes Alzheimer's disease in his father; Diabetes in his father and maternal grandmother; Hypertension in his father; Pulmonary embolism in  his mother.He reports that he has quit smoking. He has never used smokeless tobacco. He reports that he drinks alcohol. He reports that he does not use drugs.  No current outpatient prescriptions on file prior to visit.   No current facility-administered medications on file prior to visit.     ROS Review of Systems  Constitutional: Negative for chills, diaphoresis, fever and unexpected weight change.  HENT: Negative for congestion, hearing loss, rhinorrhea and sore throat.   Eyes: Negative for visual disturbance.  Respiratory: Negative for cough and shortness of breath.   Cardiovascular: Negative for chest pain.  Gastrointestinal: Negative for abdominal pain, constipation and diarrhea.  Genitourinary: Negative for dysuria and flank pain.  Musculoskeletal: Negative for arthralgias and joint swelling.  Skin: Negative for rash.  Neurological: Positive for dizziness and light-headedness. Negative for headaches.  Psychiatric/Behavioral: Negative for dysphoric mood and sleep disturbance.    Objective:  BP (!) 198/116   Pulse 92   Temp 98.4 F (36.9 C) (Oral)   Ht '5\' 5"'$  (1.651 m)   Wt 161 lb 15.7 oz (73.5 kg)   BMI 26.95 kg/m   BP Readings from Last 3 Encounters:  03/13/16 (!) 198/116  07/05/15 130/79  01/30/15 (!) 168/100    Wt Readings from Last 3 Encounters:  03/13/16 161 lb 15.7 oz (73.5 kg)  07/05/15 158 lb 3.2 oz (71.8 kg)  01/30/15 163 lb (73.9 kg)     Physical Exam  Constitutional: He is oriented to person, place, and time. He appears well-developed and well-nourished. No distress.  HENT:  Head: Normocephalic and atraumatic.  Right Ear: External ear normal.  Left Ear: External ear normal.  Nose: Nose normal.  Mouth/Throat: Oropharynx is clear and moist.  Eyes: Conjunctivae and EOM are normal. Pupils are equal, round, and reactive to light.  Neck: Normal range of motion. Neck supple. No thyromegaly present.  Cardiovascular: Normal rate, regular rhythm and normal  heart sounds.   No murmur heard. Pulmonary/Chest: Effort normal and breath sounds normal. No respiratory distress. He has no wheezes. He has no rales.  Abdominal: Soft. Bowel sounds are normal. He exhibits no distension. There is no tenderness.  Lymphadenopathy:    He has no cervical adenopathy.  Neurological: He is alert and oriented to person, place, and time. He has normal reflexes.  Skin: Skin is warm and dry.  Psychiatric: He has a normal mood and affect. His behavior is normal. Judgment and thought content normal.    No components found for: [id number]  Lab Results  Component Value Date   WBC 8.5 01/20/2015   HCT 41.9 01/20/2015   PLT 220 01/20/2015   GLUCOSE 161 (H) 01/20/2015   CHOL 235 (H) 01/20/2015   TRIG 214 (H) 01/20/2015   HDL 41 01/20/2015   LDLCALC 151 (H) 01/20/2015   ALT 15 01/20/2015   AST 13 01/20/2015   NA 144 01/20/2015   K 3.3 (L) 01/20/2015   CL 100 01/20/2015   CREATININE 0.79 01/20/2015   BUN 12 01/20/2015   CO2 25 01/20/2015   HGBA1C 7.2 (H) 01/20/2015       Assessment & Plan:   Cloud was seen today for follow-up and dizziness.  Diagnoses and all orders for this visit:  Type 2 diabetes mellitus with hyperlipidemia (HCC) -     Microalbumin / creatinine urine ratio -     Bayer DCA Hb A1c Waived -     Lipid panel -     CMP14+EGFR -     CBC with Differential/Platelet  Essential hypertension -     CMP14+EGFR  Mixed hyperlipidemia -     Lipid panel -     CMP14+EGFR  Other orders -     Vitamin D, Ergocalciferol, (DRISDOL) 50000 units CAPS capsule; Take 1 capsule by mouth  every 7 days -     Amlodipine-Valsartan-HCTZ 5-160-25 MG TABS; Take 1 tablet by mouth daily. For blood pressure -     sitaGLIPtin (JANUVIA) 100 MG tablet; Take 1 tablet (100 mg total) by mouth daily. -     metFORMIN (GLUCOPHAGE) 500 MG tablet; Take 1 tablet (500 mg total) by mouth 2 (two) times daily. -     Pitavastatin Calcium (LIVALO) 2 MG TABS; Take 1  tablet (2 mg total) by mouth 2 (two) times daily. Reported on 07/05/2015   I have discontinued Mr. Calabrese's azithromycin, benzonatate, and HYDROcodone-homatropine. I have changed his JANUVIA to sitaGLIPtin. I have also changed his Amlodipine-Valsartan-HCTZ, metFORMIN, and Pitavastatin Calcium. Additionally, I am having him maintain his Vitamin D (Ergocalciferol).  Meds ordered this encounter  Medications  . Vitamin D, Ergocalciferol, (DRISDOL) 50000 units CAPS capsule    Sig: Take 1 capsule by mouth  every 7 days    Dispense:  13 capsule    Refill:  3  . Amlodipine-Valsartan-HCTZ 5-160-25 MG TABS    Sig: Take 1 tablet by mouth daily. For blood pressure    Dispense:  90 tablet    Refill:  0    Patient NTBS for follow up and lab work  . sitaGLIPtin (JANUVIA) 100 MG tablet    Sig: Take 1 tablet (100 mg  total) by mouth daily.    Dispense:  90 tablet    Refill:  3  . metFORMIN (GLUCOPHAGE) 500 MG tablet    Sig: Take 1 tablet (500 mg total) by mouth 2 (two) times daily.    Dispense:  180 tablet    Refill:  3  . Pitavastatin Calcium (LIVALO) 2 MG TABS    Sig: Take 1 tablet (2 mg total) by mouth 2 (two) times daily. Reported on 07/05/2015    Dispense:  90 tablet    Refill:  3     Follow-up: No Follow-up on file.  Claretta Fraise, M.D.

## 2016-03-12 NOTE — Telephone Encounter (Signed)
Aware, he is due for lab work and an office visit for medication refills. He plans to speak with the nurse here on Saturday to ask Dr. Livia Snellen to order labs .

## 2016-03-13 ENCOUNTER — Ambulatory Visit (INDEPENDENT_AMBULATORY_CARE_PROVIDER_SITE_OTHER): Payer: Managed Care, Other (non HMO) | Admitting: Family Medicine

## 2016-03-13 ENCOUNTER — Encounter: Payer: Self-pay | Admitting: Family Medicine

## 2016-03-13 VITALS — BP 198/116 | HR 92 | Temp 98.4°F | Ht 65.0 in | Wt 162.0 lb

## 2016-03-13 DIAGNOSIS — E782 Mixed hyperlipidemia: Secondary | ICD-10-CM

## 2016-03-13 DIAGNOSIS — E785 Hyperlipidemia, unspecified: Secondary | ICD-10-CM

## 2016-03-13 DIAGNOSIS — E1169 Type 2 diabetes mellitus with other specified complication: Secondary | ICD-10-CM | POA: Diagnosis not present

## 2016-03-13 DIAGNOSIS — I10 Essential (primary) hypertension: Secondary | ICD-10-CM

## 2016-03-13 MED ORDER — PITAVASTATIN CALCIUM 2 MG PO TABS: 1.0000 | ORAL_TABLET | Freq: Two times a day (BID) | ORAL | 3 refills | Status: DC

## 2016-03-13 MED ORDER — METFORMIN HCL 500 MG PO TABS: 500.0000 mg | ORAL_TABLET | Freq: Two times a day (BID) | ORAL | 3 refills | Status: DC

## 2016-03-13 MED ORDER — SITAGLIPTIN PHOSPHATE 100 MG PO TABS: 100.0000 mg | ORAL_TABLET | Freq: Every day | ORAL | 3 refills | Status: DC

## 2016-03-13 MED ORDER — VITAMIN D (ERGOCALCIFEROL) 1.25 MG (50000 UNIT) PO CAPS: ORAL_CAPSULE | ORAL | 3 refills | Status: DC

## 2016-03-13 MED ORDER — AMLODIPINE-VALSARTAN-HCTZ 5-160-25 MG PO TABS: 1.0000 | ORAL_TABLET | Freq: Every day | ORAL | 0 refills | Status: DC

## 2016-03-14 LAB — MICROALBUMIN / CREATININE URINE RATIO
Creatinine, Urine: 286.7 mg/dL
Microalb/Creat Ratio: 86.6 mg/g creat — ABNORMAL HIGH (ref 0.0–30.0)
Microalbumin, Urine: 248.3 ug/mL

## 2016-03-15 LAB — BAYER DCA HB A1C WAIVED: HB A1C (BAYER DCA - WAIVED): 6.8 % (ref <7.0)

## 2016-03-15 NOTE — Progress Notes (Signed)
Please contact the patient regarding: There is some microalbumin in his urine. I recommend increasing the valsartan component of his blood pressure pill to the full 320 mg. I can send that if he is willing

## 2016-03-16 ENCOUNTER — Telehealth: Payer: Self-pay

## 2016-03-16 MED ORDER — ROSUVASTATIN CALCIUM 5 MG PO TABS: 5.0000 mg | ORAL_TABLET | Freq: Every day | ORAL | 3 refills | Status: DC

## 2016-03-16 NOTE — Telephone Encounter (Signed)
Left message on cell voice mail about change in medication.

## 2016-03-16 NOTE — Telephone Encounter (Signed)
I sent in the requested prescription 

## 2016-03-18 LAB — CBC WITH DIFFERENTIAL/PLATELET
BASOS ABS: 0 10*3/uL (ref 0.0–0.2)
Basos: 0 %
EOS (ABSOLUTE): 0.2 10*3/uL (ref 0.0–0.4)
Eos: 2 %
HEMOGLOBIN: 13.8 g/dL (ref 13.0–17.7)
Hematocrit: 41.7 % (ref 37.5–51.0)
Immature Grans (Abs): 0 10*3/uL (ref 0.0–0.1)
Immature Granulocytes: 0 %
Lymphocytes Absolute: 2.2 10*3/uL (ref 0.7–3.1)
Lymphs: 31 %
MCH: 31.4 pg (ref 26.6–33.0)
MCHC: 33.1 g/dL (ref 31.5–35.7)
MCV: 95 fL (ref 79–97)
MONOCYTES: 5 %
MONOS ABS: 0.3 10*3/uL (ref 0.1–0.9)
NEUTROS ABS: 4.3 10*3/uL (ref 1.4–7.0)
Neutrophils: 62 %
Platelets: 237 10*3/uL (ref 150–379)
RBC: 4.39 x10E6/uL (ref 4.14–5.80)
RDW: 13.2 % (ref 12.3–15.4)
WBC: 7 10*3/uL (ref 3.4–10.8)

## 2016-03-18 LAB — CMP14+EGFR
A/G RATIO: 1.9 (ref 1.2–2.2)
ALBUMIN: 4.7 g/dL (ref 3.5–5.5)
ALT: 18 IU/L (ref 0–44)
AST: 14 IU/L (ref 0–40)
Alkaline Phosphatase: 65 IU/L (ref 39–117)
BILIRUBIN TOTAL: 0.7 mg/dL (ref 0.0–1.2)
BUN / CREAT RATIO: 13 (ref 9–20)
BUN: 10 mg/dL (ref 6–24)
CHLORIDE: 98 mmol/L (ref 96–106)
CO2: 26 mmol/L (ref 18–29)
Calcium: 9.3 mg/dL (ref 8.7–10.2)
Creatinine, Ser: 0.79 mg/dL (ref 0.76–1.27)
GFR calc non Af Amer: 99 mL/min/{1.73_m2} (ref >59)
GFR, EST AFRICAN AMERICAN: 114 mL/min/{1.73_m2} (ref >59)
Globulin, Total: 2.5 g/dL (ref 1.5–4.5)
Glucose: 309 mg/dL — ABNORMAL HIGH (ref 65–99)
POTASSIUM: 3.7 mmol/L (ref 3.5–5.2)
SODIUM: 142 mmol/L (ref 134–144)
TOTAL PROTEIN: 7.2 g/dL (ref 6.0–8.5)

## 2016-03-18 LAB — LIPID PANEL
Chol/HDL Ratio: 5.1 ratio units — ABNORMAL HIGH (ref 0.0–5.0)
Cholesterol, Total: 239 mg/dL — ABNORMAL HIGH (ref 100–199)
HDL: 47 mg/dL (ref >39)
LDL Calculated: 152 mg/dL — ABNORMAL HIGH (ref 0–99)
Triglycerides: 199 mg/dL — ABNORMAL HIGH (ref 0–149)
VLDL Cholesterol Cal: 40 mg/dL (ref 5–40)

## 2016-05-03 ENCOUNTER — Other Ambulatory Visit: Payer: Self-pay | Admitting: Family Medicine

## 2016-05-31 ENCOUNTER — Telehealth: Payer: Self-pay | Admitting: Family Medicine

## 2016-05-31 NOTE — Telephone Encounter (Signed)
What is the name of the medication? livalo  Have you contacted your pharmacy to request a refill? yes  Which pharmacy would you like this sent to? [facility] at [phone]   Patient notified that their request is being sent to the clinical staff for review and that they should receive a call once it is complete. If they do not receive a call within 24 hours they can check with their pharmacy or our office.

## 2016-06-01 MED ORDER — ROSUVASTATIN CALCIUM 5 MG PO TABS: 5.0000 mg | ORAL_TABLET | Freq: Every day | ORAL | 3 refills | Status: DC

## 2016-06-01 NOTE — Telephone Encounter (Signed)
Patient called requesting Rx for Livalo sent to [facility].  Do not see this medication listed in patient's chart.  Informed patient that crestor was sent to [facility] in February.   Patient states that he was not aware this medication was sent.  Will resend crestor and patient states that he will pick it up this week.

## 2016-07-19 ENCOUNTER — Other Ambulatory Visit: Payer: Self-pay | Admitting: Family Medicine

## 2016-07-21 ENCOUNTER — Telehealth: Payer: Self-pay | Admitting: *Deleted

## 2016-08-09 NOTE — Telephone Encounter (Signed)
error 

## 2016-08-28 ENCOUNTER — Other Ambulatory Visit: Payer: Managed Care, Other (non HMO)

## 2016-08-28 ENCOUNTER — Other Ambulatory Visit: Payer: Self-pay | Admitting: Family Medicine

## 2016-08-28 DIAGNOSIS — E785 Hyperlipidemia, unspecified: Principal | ICD-10-CM

## 2016-08-28 DIAGNOSIS — E1169 Type 2 diabetes mellitus with other specified complication: Secondary | ICD-10-CM

## 2016-08-28 DIAGNOSIS — E782 Mixed hyperlipidemia: Secondary | ICD-10-CM

## 2016-09-16 LAB — SPECIMEN STATUS REPORT

## 2016-09-17 LAB — CBC WITH DIFFERENTIAL/PLATELET
BASOS: 1 %
Basophils Absolute: 0.1 10*3/uL (ref 0.0–0.2)
EOS (ABSOLUTE): 0.1 10*3/uL (ref 0.0–0.4)
EOS: 2 %
HEMATOCRIT: 38.9 % (ref 37.5–51.0)
HEMOGLOBIN: 13.6 g/dL (ref 13.0–17.7)
IMMATURE GRANS (ABS): 0 10*3/uL (ref 0.0–0.1)
IMMATURE GRANULOCYTES: 0 %
LYMPHS: 35 %
Lymphocytes Absolute: 2.8 10*3/uL (ref 0.7–3.1)
MCH: 32.1 pg (ref 26.6–33.0)
MCHC: 35 g/dL (ref 31.5–35.7)
MCV: 92 fL (ref 79–97)
MONOCYTES: 5 %
Monocytes Absolute: 0.4 10*3/uL (ref 0.1–0.9)
NEUTROS PCT: 57 %
Neutrophils Absolute: 4.7 10*3/uL (ref 1.4–7.0)
Platelets: 189 10*3/uL (ref 150–379)
RBC: 4.24 x10E6/uL (ref 4.14–5.80)
RDW: 13.6 % (ref 12.3–15.4)
WBC: 8.1 10*3/uL (ref 3.4–10.8)

## 2016-09-17 LAB — CMP14+EGFR
A/G RATIO: 1.7 (ref 1.2–2.2)
ALBUMIN: 4.5 g/dL (ref 3.5–5.5)
ALT: 12 IU/L (ref 0–44)
AST: 11 IU/L (ref 0–40)
Alkaline Phosphatase: 59 IU/L (ref 39–117)
BILIRUBIN TOTAL: 0.7 mg/dL (ref 0.0–1.2)
BUN / CREAT RATIO: 15 (ref 9–20)
BUN: 12 mg/dL (ref 6–24)
CALCIUM: 9.9 mg/dL (ref 8.7–10.2)
CHLORIDE: 97 mmol/L (ref 96–106)
CO2: 27 mmol/L (ref 20–29)
Creatinine, Ser: 0.78 mg/dL (ref 0.76–1.27)
GFR, EST AFRICAN AMERICAN: 115 mL/min/{1.73_m2} (ref >59)
GFR, EST NON AFRICAN AMERICAN: 99 mL/min/{1.73_m2} (ref >59)
GLOBULIN, TOTAL: 2.6 g/dL (ref 1.5–4.5)
Glucose: 298 mg/dL — ABNORMAL HIGH (ref 65–99)
POTASSIUM: 3.5 mmol/L (ref 3.5–5.2)
Sodium: 139 mmol/L (ref 134–144)
TOTAL PROTEIN: 7.1 g/dL (ref 6.0–8.5)

## 2016-09-17 LAB — LIPID PANEL
CHOL/HDL RATIO: 6.3 ratio — AB (ref 0.0–5.0)
Cholesterol, Total: 228 mg/dL — ABNORMAL HIGH (ref 100–199)
HDL: 36 mg/dL — AB (ref >39)
LDL Calculated: 113 mg/dL — ABNORMAL HIGH (ref 0–99)
Triglycerides: 394 mg/dL — ABNORMAL HIGH (ref 0–149)
VLDL Cholesterol Cal: 79 mg/dL — ABNORMAL HIGH (ref 5–40)

## 2016-11-08 ENCOUNTER — Other Ambulatory Visit: Payer: Self-pay | Admitting: Family Medicine

## 2016-11-08 NOTE — Telephone Encounter (Signed)
Last seen 03/13/16  Dr Livia Snellen

## 2017-01-03 ENCOUNTER — Encounter (HOSPITAL_COMMUNITY): Payer: Self-pay | Admitting: Emergency Medicine

## 2017-01-03 ENCOUNTER — Emergency Department (HOSPITAL_COMMUNITY)
Admission: EM | Admit: 2017-01-03 | Discharge: 2017-01-03 | Disposition: A | Discharge status: Home / Self Care | Payer: Managed Care, Other (non HMO) | Attending: Emergency Medicine | Admitting: Emergency Medicine

## 2017-01-03 DIAGNOSIS — R42 Dizziness and giddiness: Secondary | ICD-10-CM | POA: Diagnosis not present

## 2017-01-03 DIAGNOSIS — Z5321 Procedure and treatment not carried out due to patient leaving prior to being seen by health care provider: Secondary | ICD-10-CM | POA: Insufficient documentation

## 2017-01-03 LAB — BASIC METABOLIC PANEL
ANION GAP: 10 (ref 5–15)
BUN: 10 mg/dL (ref 6–20)
CHLORIDE: 103 mmol/L (ref 101–111)
CO2: 26 mmol/L (ref 22–32)
Calcium: 8.8 mg/dL — ABNORMAL LOW (ref 8.9–10.3)
Creatinine, Ser: 1.14 mg/dL (ref 0.61–1.24)
GFR calc Af Amer: >60 mL/min (ref >60)
GLUCOSE: 366 mg/dL — AB (ref 65–99)
POTASSIUM: 3.3 mmol/L — AB (ref 3.5–5.1)
Sodium: 139 mmol/L (ref 135–145)

## 2017-01-03 LAB — CBC
HEMATOCRIT: 36.3 % — AB (ref 39.0–52.0)
HEMOGLOBIN: 13 g/dL (ref 13.0–17.0)
MCH: 32.1 pg (ref 26.0–34.0)
MCHC: 35.8 g/dL (ref 30.0–36.0)
MCV: 89.6 fL (ref 78.0–100.0)
Platelets: 210 10*3/uL (ref 150–400)
RBC: 4.05 MIL/uL — ABNORMAL LOW (ref 4.22–5.81)
RDW: 13 % (ref 11.5–15.5)
WBC: 7.2 10*3/uL (ref 4.0–10.5)

## 2017-01-03 LAB — CBG MONITORING, ED: Glucose-Capillary: 290 mg/dL — ABNORMAL HIGH (ref 65–99)

## 2017-01-03 NOTE — ED Notes (Signed)
CBG done at 19:03 was 290.

## 2017-01-03 NOTE — ED Notes (Signed)
Pt informed other staff that they were leaving and I was handed there bracelet.

## 2017-01-03 NOTE — ED Notes (Signed)
No answer x2 for room 

## 2017-01-03 NOTE — ED Triage Notes (Signed)
Pt to ER via [facility] - coming from work where patient went to the bathroom, felt dizzy, had syncopal episode, fell unwitnessed, laceration to left forehead. Pt CBG 426, patient is hypertensive per EMS with BP 172/110, states has been off his medications for 1 week.

## 2017-01-04 ENCOUNTER — Other Ambulatory Visit: Payer: Self-pay | Admitting: Family Medicine

## 2017-01-04 ENCOUNTER — Other Ambulatory Visit: Payer: Self-pay | Admitting: *Deleted

## 2017-01-04 MED ORDER — METFORMIN HCL 500 MG PO TABS: 500.0000 mg | ORAL_TABLET | Freq: Two times a day (BID) | ORAL | 3 refills | Status: DC

## 2017-01-04 MED ORDER — SITAGLIPTIN PHOSPHATE 100 MG PO TABS: 100.0000 mg | ORAL_TABLET | Freq: Every day | ORAL | 3 refills | Status: DC

## 2017-01-04 MED ORDER — METFORMIN HCL 500 MG PO TABS
500.0000 mg | ORAL_TABLET | Freq: Two times a day (BID) | ORAL | 3 refills | Status: DC
Start: 2017-01-04 — End: 2017-01-04

## 2017-01-04 NOTE — Addendum Note (Signed)
Addended by: Antonietta Barcelona D on: 01/04/2017 04:04 PM   Modules accepted: Orders

## 2017-01-04 NOTE — Telephone Encounter (Signed)
Changed pharmacy's

## 2017-01-26 ENCOUNTER — Other Ambulatory Visit: Payer: Self-pay | Admitting: Family Medicine

## 2017-01-26 DIAGNOSIS — E782 Mixed hyperlipidemia: Secondary | ICD-10-CM

## 2017-01-26 DIAGNOSIS — E785 Hyperlipidemia, unspecified: Secondary | ICD-10-CM

## 2017-01-26 DIAGNOSIS — E1169 Type 2 diabetes mellitus with other specified complication: Secondary | ICD-10-CM

## 2017-03-04 NOTE — Addendum Note (Signed)
Addended by: Liliane Bade on: 03/04/2017 11:05 AM   Modules accepted: Orders

## 2017-03-12 LAB — CBC WITH DIFFERENTIAL/PLATELET
BASOS: 0 %
Basophils Absolute: 0 10*3/uL (ref 0.0–0.2)
EOS (ABSOLUTE): 0.2 10*3/uL (ref 0.0–0.4)
EOS: 2 %
HEMATOCRIT: 38.6 % (ref 37.5–51.0)
Hemoglobin: 13.5 g/dL (ref 13.0–17.7)
IMMATURE GRANS (ABS): 0 10*3/uL (ref 0.0–0.1)
IMMATURE GRANULOCYTES: 0 %
LYMPHS: 34 %
Lymphocytes Absolute: 2.6 10*3/uL (ref 0.7–3.1)
MCH: 32.5 pg (ref 26.6–33.0)
MCHC: 35 g/dL (ref 31.5–35.7)
MCV: 93 fL (ref 79–97)
MONOS ABS: 0.4 10*3/uL (ref 0.1–0.9)
Monocytes: 5 %
Neutrophils Absolute: 4.3 10*3/uL (ref 1.4–7.0)
Neutrophils: 59 %
PLATELETS: 215 10*3/uL (ref 150–379)
RBC: 4.16 x10E6/uL (ref 4.14–5.80)
RDW: 13 % (ref 12.3–15.4)
WBC: 7.5 10*3/uL (ref 3.4–10.8)

## 2017-03-12 LAB — CMP14+EGFR
ALK PHOS: 45 IU/L (ref 39–117)
ALT: 14 IU/L (ref 0–44)
AST: 9 IU/L (ref 0–40)
Albumin/Globulin Ratio: 2 (ref 1.2–2.2)
Albumin: 4.6 g/dL (ref 3.5–5.5)
BUN/Creatinine Ratio: 14 (ref 9–20)
BUN: 10 mg/dL (ref 6–24)
Bilirubin Total: 0.8 mg/dL (ref 0.0–1.2)
CALCIUM: 9.5 mg/dL (ref 8.7–10.2)
CO2: 25 mmol/L (ref 20–29)
CREATININE: 0.73 mg/dL — AB (ref 0.76–1.27)
Chloride: 103 mmol/L (ref 96–106)
GFR calc Af Amer: 117 mL/min/{1.73_m2} (ref >59)
GFR, EST NON AFRICAN AMERICAN: 102 mL/min/{1.73_m2} (ref >59)
GLUCOSE: 201 mg/dL — AB (ref 65–99)
Globulin, Total: 2.3 g/dL (ref 1.5–4.5)
Potassium: 3.6 mmol/L (ref 3.5–5.2)
Sodium: 144 mmol/L (ref 134–144)
Total Protein: 6.9 g/dL (ref 6.0–8.5)

## 2017-03-12 LAB — LIPID PANEL
CHOL/HDL RATIO: 4 ratio (ref 0.0–5.0)
Cholesterol, Total: 199 mg/dL (ref 100–199)
HDL: 50 mg/dL (ref >39)
LDL Calculated: 125 mg/dL — ABNORMAL HIGH (ref 0–99)
TRIGLYCERIDES: 120 mg/dL (ref 0–149)
VLDL CHOLESTEROL CAL: 24 mg/dL (ref 5–40)

## 2017-03-15 ENCOUNTER — Other Ambulatory Visit: Payer: Self-pay | Admitting: Family Medicine

## 2017-03-15 LAB — SPECIMEN STATUS REPORT

## 2017-03-29 ENCOUNTER — Other Ambulatory Visit: Payer: Self-pay | Admitting: Family

## 2017-03-29 NOTE — Progress Notes (Signed)
Patient NTBS for follow up and lab work. Has been over a year since lab work. Can refill medications for 30 days if needed.

## 2017-03-31 ENCOUNTER — Other Ambulatory Visit: Payer: Self-pay | Admitting: *Deleted

## 2017-03-31 MED ORDER — SITAGLIPTIN PHOSPHATE 100 MG PO TABS: 100.0000 mg | ORAL_TABLET | Freq: Every day | ORAL | 0 refills | Status: DC

## 2017-03-31 MED ORDER — METFORMIN HCL 500 MG PO TABS: 500.0000 mg | ORAL_TABLET | Freq: Two times a day (BID) | ORAL | 0 refills | Status: DC

## 2017-03-31 MED ORDER — ROSUVASTATIN CALCIUM 5 MG PO TABS: 5.0000 mg | ORAL_TABLET | Freq: Every day | ORAL | 0 refills | Status: DC

## 2017-03-31 NOTE — Telephone Encounter (Signed)
Pt called needing RF on meds sent to [facility] Pt aware 30 days only NTBS

## 2017-03-31 NOTE — Addendum Note (Signed)
Addended by: Antonietta Barcelona D on: 03/31/2017 12:13 PM   Modules accepted: Orders

## 2017-04-30 ENCOUNTER — Telehealth: Payer: Self-pay | Admitting: Family Medicine

## 2017-04-30 MED ORDER — SILDENAFIL CITRATE 20 MG PO TABS: ORAL_TABLET | ORAL | 0 refills | Status: DC

## 2017-04-30 NOTE — Telephone Encounter (Signed)
Patient states that he is trying to make an appointment with his PCP, however he is struggling with the ED and wondered if he could try something before the appointment.  We discussed generic versus name brand, he is not sure of his coverage and would like to go ahead and try generic sildenafil.  This is sent to [facility].  Laroy Apple, MD [facility] 04/30/2017, 12:21 PM

## 2017-05-07 ENCOUNTER — Other Ambulatory Visit: Payer: Self-pay | Admitting: Family

## 2017-05-14 ENCOUNTER — Other Ambulatory Visit: Payer: Self-pay | Admitting: *Deleted

## 2017-05-14 MED ORDER — METFORMIN HCL 500 MG PO TABS: 500.0000 mg | ORAL_TABLET | Freq: Two times a day (BID) | ORAL | 0 refills | Status: DC

## 2017-05-14 MED ORDER — SITAGLIPTIN PHOSPHATE 100 MG PO TABS: 100.0000 mg | ORAL_TABLET | Freq: Every day | ORAL | 0 refills | Status: DC

## 2017-05-14 NOTE — Progress Notes (Signed)
Metformin refilled and Januvia samples given. Patient scheduled f/u appt.

## 2017-06-08 ENCOUNTER — Ambulatory Visit (INDEPENDENT_AMBULATORY_CARE_PROVIDER_SITE_OTHER): Payer: Managed Care, Other (non HMO) | Admitting: Family Medicine

## 2017-06-08 ENCOUNTER — Encounter: Payer: Self-pay | Admitting: Family Medicine

## 2017-06-08 ENCOUNTER — Ambulatory Visit (INDEPENDENT_AMBULATORY_CARE_PROVIDER_SITE_OTHER): Payer: Managed Care, Other (non HMO)

## 2017-06-08 VITALS — BP 200/110 | HR 63 | Temp 97.2°F | Ht 65.0 in | Wt 153.0 lb

## 2017-06-08 DIAGNOSIS — E785 Hyperlipidemia, unspecified: Secondary | ICD-10-CM

## 2017-06-08 DIAGNOSIS — N400 Enlarged prostate without lower urinary tract symptoms: Secondary | ICD-10-CM

## 2017-06-08 DIAGNOSIS — E1169 Type 2 diabetes mellitus with other specified complication: Secondary | ICD-10-CM

## 2017-06-08 DIAGNOSIS — E782 Mixed hyperlipidemia: Secondary | ICD-10-CM

## 2017-06-08 DIAGNOSIS — I10 Essential (primary) hypertension: Secondary | ICD-10-CM

## 2017-06-08 DIAGNOSIS — Z91148 Patient's other noncompliance with medication regimen for other reason: Secondary | ICD-10-CM | POA: Insufficient documentation

## 2017-06-08 DIAGNOSIS — Z0000 Encounter for general adult medical examination without abnormal findings: Secondary | ICD-10-CM

## 2017-06-08 DIAGNOSIS — Z9114 Patient's other noncompliance with medication regimen: Secondary | ICD-10-CM | POA: Insufficient documentation

## 2017-06-08 HISTORY — DX: Benign prostatic hyperplasia without lower urinary tract symptoms: N40.0

## 2017-06-08 LAB — URINALYSIS, COMPLETE
BILIRUBIN UA: NEGATIVE
Ketones, UA: NEGATIVE
Leukocytes, UA: NEGATIVE
NITRITE UA: NEGATIVE
PH UA: 5.5 (ref 5.0–7.5)
Specific Gravity, UA: 1.025 (ref 1.005–1.030)
UUROB: 0.2 mg/dL (ref 0.2–1.0)

## 2017-06-08 LAB — MICROSCOPIC EXAMINATION
BACTERIA UA: NONE SEEN
Epithelial Cells (non renal): NONE SEEN /hpf (ref 0–10)
Renal Epithel, UA: NONE SEEN /hpf

## 2017-06-08 LAB — BAYER DCA HB A1C WAIVED: HB A1C (BAYER DCA - WAIVED): 6.8 % (ref <7.0)

## 2017-06-08 MED ORDER — AMLODIPINE BESYLATE 5 MG PO TABS: 5.0000 mg | ORAL_TABLET | Freq: Every day | ORAL | 3 refills | Status: DC

## 2017-06-08 MED ORDER — HYDROCHLOROTHIAZIDE 25 MG PO TABS: 25.0000 mg | ORAL_TABLET | Freq: Every day | ORAL | 3 refills | Status: DC

## 2017-06-08 MED ORDER — SITAGLIPTIN PHOSPHATE 100 MG PO TABS: 100.0000 mg | ORAL_TABLET | Freq: Every day | ORAL | 3 refills | Status: DC

## 2017-06-08 MED ORDER — METFORMIN HCL 500 MG PO TABS: 500.0000 mg | ORAL_TABLET | Freq: Two times a day (BID) | ORAL | 3 refills | Status: DC

## 2017-06-08 MED ORDER — VALSARTAN 160 MG PO TABS: 160.0000 mg | ORAL_TABLET | Freq: Every day | ORAL | 3 refills | Status: DC

## 2017-06-08 MED ORDER — FREESTYLE LIBRE 14 DAY SENSOR MISC: 1.0000 "application " | 3 refills | Status: DC

## 2017-06-08 MED ORDER — ROSUVASTATIN CALCIUM 5 MG PO TABS: 5.0000 mg | ORAL_TABLET | Freq: Every day | ORAL | 3 refills | Status: DC

## 2017-06-08 MED ORDER — FREESTYLE LIBRE READER DEVI: 1.0000 | Freq: Two times a day (BID) | 1 refills | Status: DC | PRN

## 2017-06-08 NOTE — Addendum Note (Signed)
Addended by: Zannie Cove on: 06/08/2017 08:45 AM   Modules accepted: Orders

## 2017-06-08 NOTE — Progress Notes (Signed)
Subjective:    Patient ID: Darrell [name], male    DOB: 1956-01-20, 62 y.o.   MRN: [id number]  HPI Patient is here today for annual wellness exam and follow up of chronic medical problems which includes diabetes, hypertension and hyperlipidemia. He is taking medication regularly.  The patient is doing well overall and has no specific complaints.  He is not very compliant with going to see the doctor on a regular basis.  He is requesting refills on all of his medicines.  His blood pressure today was elevated.  He has had lab work done recently.  The cholesterol numbers were not bad compared to the past with an LDL C being elevated at 125.  He is not taking his Crestor currently.  Triglycerides are good and the HDL was good.  The blood sugar was elevated at 201 and the creatinine was decreased.  The electrolytes and liver function tests were normal.  The CBC was within normal limits with a good hemoglobin at 13.5.  Hemoglobin A1c was 6.8% but this goes back to 1 year ago.  The patient has no complaints today.  Patient denies any chest pain pressure tightness or shortness of breath.  He denies any trouble with his stomach including nausea vomiting diarrhea blood in the stool black tarry bowel movements.  No change in bowel habits.  His last colonoscopy was in 2013 and he said everything was fine there is no family history of colon cancer and the next one needs to be repeated to 10 years from that time.  He is passing his water well other than having frequency.  He did have a high exam this year at a [facility] in [place].  He was reminded that every time he gets an eye exam to have a copy of that report sent to [place].  Says he has not had any blood pressure medicine recently either.  He continues to be somewhat noncompliant with all of his medicines.  I warned him of the possibility of a stroke with no medications.    Patient Active Problem List   Diagnosis Date Noted  . Hypertension 01/30/2015  .  Hyperlipidemia 01/30/2015  . Hypokalemia 01/30/2015  . Type 2 diabetes mellitus with hyperlipidemia ([place]) 01/30/2015   Outpatient Encounter Medications as of 06/08/2017  Medication Sig  . cholecalciferol (VITAMIN D) 1000 units tablet Take 1,000 Units by mouth daily.  . metFORMIN (GLUCOPHAGE) 500 MG tablet Take 1 tablet (500 mg total) by mouth 2 (two) times daily.  . sitaGLIPtin (JANUVIA) 100 MG tablet Take 1 tablet (100 mg total) by mouth daily.  . [DISCONTINUED] Amlodipine-Valsartan-HCTZ 5-160-25 MG TABS TAKE 1 TABLET BY MOUTH  DAILY FOR BLOOD PRESSURE  . [DISCONTINUED] rosuvastatin (CRESTOR) 5 MG tablet Take 1 tablet (5 mg total) by mouth daily. For cholesterol  . [DISCONTINUED] Vitamin D, Ergocalciferol, (DRISDOL) 50000 units CAPS capsule Take 1 capsule by mouth  every 7 days  . sildenafil (REVATIO) 20 MG tablet Use 2 to 5 pills once daily as needed for ED (Patient not taking: Reported on 06/08/2017)   No facility-administered encounter medications on file as of 06/08/2017.       Review of Systems  Constitutional: Negative.   HENT: Negative.   Eyes: Negative.   Respiratory: Negative.   Cardiovascular: Negative.   Gastrointestinal: Negative.   Endocrine: Negative.   Genitourinary: Negative.   Musculoskeletal: Negative.   Skin: Negative.   Allergic/Immunologic: Negative.   Neurological: Negative.   Hematological: Negative.   Psychiatric/Behavioral:  Negative.        Objective:   Physical Exam  Constitutional: He is oriented to person, place, and time. He appears well-developed and well-nourished. No distress.  The patient is calm pleasant and alert and has no complaints  HENT:  Head: Normocephalic and atraumatic.  Right Ear: External ear normal.  Left Ear: External ear normal.  Nose: Nose normal.  Mouth/Throat: Oropharynx is clear and moist. No oropharyngeal exudate.  Eyes: Pupils are equal, round, and reactive to light. Conjunctivae and EOM are normal. Right eye exhibits no  discharge. Left eye exhibits no discharge. No scleral icterus.  He has had an eye exam done this year at a [facility] in [place] and he will have them to send [place] a hard copy of that report  Neck: Normal range of motion. Neck supple. No thyromegaly present.  No bruits thyromegaly or anterior cervical adenopathy  Cardiovascular: Normal rate, regular rhythm, normal heart sounds and intact distal pulses.  No murmur heard. Heart is regular at 72/min  Pulmonary/Chest: Effort normal and breath sounds normal. He has no wheezes. He has no rales. He exhibits no tenderness.  Clear anteriorly and posteriorly no axillary adenopathy or chest wall masses  Abdominal: Soft. Bowel sounds are normal. He exhibits no mass. There is no tenderness. There is no guarding.  No abdominal tenderness masses organ enlargement bruits or inguinal adenopathy  Genitourinary: Rectum normal, prostate normal and penis normal.  Genitourinary Comments: The prostate is enlarged but soft and smooth.  There were no rectal masses.  The external genitalia were within normal limits with no testicular abnormalities or masses and no inguinal hernias being palpated.  Musculoskeletal: Normal range of motion. He exhibits no edema.  Lymphadenopathy:    He has no cervical adenopathy.  Neurological: He is alert and oriented to person, place, and time. He has normal reflexes. No cranial nerve deficit.  Skin: Skin is warm and dry. No rash noted.  Psychiatric: He has a normal mood and affect. His behavior is normal. Judgment and thought content normal.  The patient has a normal affect and mood and behavior.  Nursing note and vitals reviewed.  BP (!) 191/96 (BP Location: Left Arm)   Pulse 63   Temp (!) 97.2 F (36.2 C) (Oral)   Ht 5\' 5"  (1.651 m)   Wt 153 lb (69.4 kg)   BMI 25.46 kg/m         Assessment & Plan:  1. Annual physical exam -The patient is pleasant and alert but unfortunately continues to be noncompliant with taking his  medicines for blood sugar cholesterol and especially blood pressure.  We discussed this importance of taking these medications to prevent some major event from occurring and he understands this but getting him to do the medicine seems to be difficult.  We will restart his blood pressure medicines and make sure he is taking all of his blood sugar medicines regularly.  He will have his blood pressure checked when he is here on Saturday mornings by the nurse and I will see him back in about 4 weeks. - Bayer DCA Hb A1c Waived - Urinalysis, Complete - PSA, total and free - DG Chest 2 View; Future  2. Type 2 diabetes mellitus with hyperlipidemia ([place]) -Continue with current treatment pending results of lab work - Bayer DCA Hb A1c Waived - Microalbumin / creatinine urine ratio  3. 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 Mixed hyperlipidemia -Resume Crestor and continue aggressive therapeutic lifestyle changes pending results of lab work.  He is diabetic and therefore needs to stay on Crestor. - DG Chest 2 View; Future  5. Noncompliance with medications -Importance of taking his medicine was reemphasized to him today.  6. Benign prostatic hyperplasia without lower urinary tract symptoms -No symptoms with voiding.  Meds ordered this encounter  Medications  . Continuous Blood Gluc Receiver (FREESTYLE LIBRE READER) DEVI    Sig: 1 applicator by Does not apply route 2 (two) times daily as needed.    Dispense:  1 Device    Refill:  1    Dx.E11.9  . Continuous Blood Gluc Sensor (FREESTYLE LIBRE 14 DAY SENSOR) MISC    Sig: 1 application by Does not apply route every 14 (fourteen) days.    Dispense:  6 each    Refill:  3    Dx E11.9  . metFORMIN (GLUCOPHAGE) 500 MG tablet    Sig: Take 1 tablet (500 mg total) by mouth 2 (two) times daily.    Dispense:  90 tablet     Refill:  3  . sitaGLIPtin (JANUVIA) 100 MG tablet    Sig: Take 1 tablet (100 mg total) by mouth daily.    Dispense:  90 tablet    Refill:  3  . amLODipine (NORVASC) 5 MG tablet    Sig: Take 1 tablet (5 mg total) by mouth daily.    Dispense:  90 tablet    Refill:  3  . valsartan (DIOVAN) 160 MG tablet    Sig: Take 1 tablet (160 mg total) by mouth daily.    Dispense:  90 tablet    Refill:  3  . hydrochlorothiazide (HYDRODIURIL) 25 MG tablet    Sig: Take 1 tablet (25 mg total) by mouth daily.    Dispense:  90 tablet    Refill:  3   Patient Instructions  Continue current medications. Continue good therapeutic lifestyle changes which include good diet and exercise. Fall precautions discussed with patient. If an FOBT was given today- please return it to our front desk. If you are over 24 years old - you may need Prevnar 87 or the adult Pneumonia vaccine.  **Flu shots are available--- please call and schedule a FLU-CLINIC appointment**  After your visit with [place] today you will receive a survey in the mail or online from [employer] regarding your care with [place]. Please take a moment to fill this out. Your feedback is very important to [place] as you can help [place] better understand your patient needs as well as improve your experience and satisfaction. WE CARE ABOUT YOU!!!   Duane, you must take medications or something really bad is going to happen to you.  It is very important to take your blood pressure medicine regularly and your blood sugar medicine regularly and that you continue to watch her diet as closely as possible watching her sodium intake and getting as much exercise as possible Please have the nurse in our office to check your blood pressures weekly on Saturday mornings when you are here doing lab work Return to clinic and see me in 4 weeks and try to stay on the medicines regularly until that time Bring in blood sugars for review we will call with any lab work done today as soon as that  becomes available    Arrie Senate MD

## 2017-06-08 NOTE — Patient Instructions (Addendum)
Continue current medications. Continue good therapeutic lifestyle changes which include good diet and exercise. Fall precautions discussed with patient. If an FOBT was given today- please return it to our front desk. If you are over 60 years old - you may need Prevnar 24 or the adult Pneumonia vaccine.  **Flu shots are available--- please call and schedule a FLU-CLINIC appointment**  After your visit with [place] today you will receive a survey in the mail or online from [employer] regarding your care with [place]. Please take a moment to fill this out. Your feedback is very important to [place] as you can help [place] better understand your patient needs as well as improve your experience and satisfaction. WE CARE ABOUT YOU!!!   Darrell [name], you must take medications or something really bad is going to happen to you.  It is very important to take your blood pressure medicine regularly and your blood sugar medicine regularly and that you continue to watch her diet as closely as possible watching her sodium intake and getting as much exercise as possible Please have the nurse in our office to check your blood pressures weekly on Saturday mornings when you are here doing lab work Return to clinic and see me in 4 weeks and try to stay on the medicines regularly until that time Bring in blood sugars for review we will call with any lab work done today as soon as that becomes available

## 2017-06-09 LAB — PSA, TOTAL AND FREE
PROSTATE SPECIFIC AG, SERUM: 0.5 ng/mL (ref 0.0–4.0)
PSA, Free Pct: 18 %
PSA, Free: 0.09 ng/mL

## 2017-06-09 LAB — MICROALBUMIN / CREATININE URINE RATIO
Creatinine, Urine: 218.2 mg/dL
Microalb/Creat Ratio: 51.7 mg/g creat — ABNORMAL HIGH (ref 0.0–30.0)
Microalbumin, Urine: 112.8 ug/mL

## 2017-07-04 ENCOUNTER — Telehealth: Payer: Self-pay | Admitting: Family

## 2017-07-04 NOTE — Telephone Encounter (Signed)
Please advise if he can be seen Wednesday by Dr. Laurance Flatten.

## 2017-07-04 NOTE — Telephone Encounter (Signed)
Offered pt 330 or 400 on WED  - LM for pt to call and let me know if that works

## 2017-07-05 ENCOUNTER — Ambulatory Visit: Payer: Managed Care, Other (non HMO) | Admitting: Family Medicine

## 2017-07-09 ENCOUNTER — Telehealth: Payer: Self-pay | Admitting: *Deleted

## 2017-07-09 NOTE — Telephone Encounter (Signed)
Patient here today for BP check.  First reading 175/104 68, second reading 167/95 71

## 2017-07-11 NOTE — Telephone Encounter (Signed)
First of all, make sure that he is taking his amlodipine 5 mg daily and HydroDIURIL or HCTZ 25 mg daily.  If he is taking these 2 medications and no other medicines, please add lisinopril 10 mg 1 daily to this current regimen and we need additional blood pressure readings in 7 to 10 days.  Also encouraged him to restrict his sodium intake.

## 2017-07-11 NOTE — Telephone Encounter (Signed)
NA/ BUSY 6/10-jhb

## 2017-07-20 ENCOUNTER — Other Ambulatory Visit: Payer: Self-pay | Admitting: *Deleted

## 2017-07-20 MED ORDER — METFORMIN HCL 500 MG PO TABS: 500.0000 mg | ORAL_TABLET | Freq: Two times a day (BID) | ORAL | 3 refills | Status: DC

## 2017-07-20 MED ORDER — SITAGLIPTIN PHOSPHATE 100 MG PO TABS: 100.0000 mg | ORAL_TABLET | Freq: Every day | ORAL | 3 refills | Status: DC

## 2017-08-06 NOTE — Telephone Encounter (Signed)
Spoke with pt in office today = he has not been taking either of these meds - he is aware they were sent to [facility] on 06/08/17. He will pick them and start them and have [facility] clinic nurse rck bp in 2 weeks.

## 2017-10-12 ENCOUNTER — Ambulatory Visit: Payer: Managed Care, Other (non HMO) | Admitting: *Deleted

## 2017-10-12 ENCOUNTER — Other Ambulatory Visit: Payer: Self-pay | Admitting: *Deleted

## 2017-10-12 ENCOUNTER — Ambulatory Visit: Payer: Managed Care, Other (non HMO) | Admitting: Family Medicine

## 2017-10-12 VITALS — BP 181/99 | HR 72

## 2017-10-12 DIAGNOSIS — E785 Hyperlipidemia, unspecified: Principal | ICD-10-CM

## 2017-10-12 DIAGNOSIS — I10 Essential (primary) hypertension: Secondary | ICD-10-CM

## 2017-10-12 DIAGNOSIS — Z0000 Encounter for general adult medical examination without abnormal findings: Secondary | ICD-10-CM

## 2017-10-12 DIAGNOSIS — E1169 Type 2 diabetes mellitus with other specified complication: Secondary | ICD-10-CM

## 2017-10-12 DIAGNOSIS — E782 Mixed hyperlipidemia: Secondary | ICD-10-CM

## 2017-10-12 DIAGNOSIS — Z0130 Encounter for examination of blood pressure without abnormal findings: Secondary | ICD-10-CM

## 2017-10-12 LAB — BAYER DCA HB A1C WAIVED: HB A1C (BAYER DCA - WAIVED): 8.4 % — ABNORMAL HIGH (ref <7.0)

## 2017-10-13 NOTE — Progress Notes (Signed)
Pt here for BP ck BP 182 103 P 74  rck BP 181 99 P 72

## 2017-10-17 ENCOUNTER — Encounter: Payer: Self-pay | Admitting: Family

## 2017-10-22 ENCOUNTER — Other Ambulatory Visit: Payer: Self-pay | Admitting: *Deleted

## 2017-10-22 MED ORDER — METFORMIN HCL 500 MG PO TABS: 500.0000 mg | ORAL_TABLET | Freq: Two times a day (BID) | ORAL | 3 refills | Status: DC

## 2017-10-26 ENCOUNTER — Ambulatory Visit: Payer: Managed Care, Other (non HMO) | Admitting: Family Medicine

## 2017-10-26 LAB — HEPATIC FUNCTION PANEL
ALBUMIN: 4.6 g/dL (ref 3.6–4.8)
ALT: 14 IU/L (ref 0–44)
AST: 15 IU/L (ref 0–40)
Alkaline Phosphatase: 47 IU/L (ref 39–117)
BILIRUBIN TOTAL: 0.9 mg/dL (ref 0.0–1.2)
Bilirubin, Direct: 0.17 mg/dL (ref 0.00–0.40)
Total Protein: 6.9 g/dL (ref 6.0–8.5)

## 2017-10-26 LAB — CBC WITH DIFFERENTIAL/PLATELET
BASOS ABS: 0 10*3/uL (ref 0.0–0.2)
Basos: 1 %
EOS (ABSOLUTE): 0.1 10*3/uL (ref 0.0–0.4)
Eos: 2 %
Hematocrit: 40.3 % (ref 37.5–51.0)
Hemoglobin: 13.8 g/dL (ref 13.0–17.7)
IMMATURE GRANS (ABS): 0 10*3/uL (ref 0.0–0.1)
IMMATURE GRANULOCYTES: 0 %
LYMPHS: 31 %
Lymphocytes Absolute: 2.2 10*3/uL (ref 0.7–3.1)
MCH: 31.2 pg (ref 26.6–33.0)
MCHC: 34.2 g/dL (ref 31.5–35.7)
MCV: 91 fL (ref 79–97)
MONOS ABS: 0.5 10*3/uL (ref 0.1–0.9)
Monocytes: 7 %
NEUTROS PCT: 59 %
Neutrophils Absolute: 4.2 10*3/uL (ref 1.4–7.0)
Platelets: 203 10*3/uL (ref 150–450)
RBC: 4.42 x10E6/uL (ref 4.14–5.80)
RDW: 12.1 % — AB (ref 12.3–15.4)
WBC: 7 10*3/uL (ref 3.4–10.8)

## 2017-10-26 LAB — LIPID PANEL
CHOL/HDL RATIO: 4.8 ratio (ref 0.0–5.0)
Cholesterol, Total: 211 mg/dL — ABNORMAL HIGH (ref 100–199)
HDL: 44 mg/dL (ref >39)
LDL CALC: 142 mg/dL — AB (ref 0–99)
Triglycerides: 123 mg/dL (ref 0–149)
VLDL CHOLESTEROL CAL: 25 mg/dL (ref 5–40)

## 2017-10-26 LAB — BMP8+EGFR
BUN / CREAT RATIO: 14 (ref 10–24)
BUN: 10 mg/dL (ref 8–27)
CO2: 23 mmol/L (ref 20–29)
Calcium: 9.5 mg/dL (ref 8.6–10.2)
Chloride: 101 mmol/L (ref 96–106)
Creatinine, Ser: 0.7 mg/dL — ABNORMAL LOW (ref 0.76–1.27)
GFR, EST AFRICAN AMERICAN: 119 mL/min/{1.73_m2} (ref >59)
GFR, EST NON AFRICAN AMERICAN: 103 mL/min/{1.73_m2} (ref >59)
GLUCOSE: 266 mg/dL — AB (ref 65–99)
Potassium: 3.5 mmol/L (ref 3.5–5.2)
Sodium: 139 mmol/L (ref 134–144)

## 2017-10-26 LAB — VITAMIN D 25 HYDROXY (VIT D DEFICIENCY, FRACTURES): Vit D, 25-Hydroxy: 26.6 ng/mL — ABNORMAL LOW (ref 30.0–100.0)

## 2017-11-16 ENCOUNTER — Encounter: Payer: Self-pay | Admitting: *Deleted

## 2017-12-17 DIAGNOSIS — Z23 Encounter for immunization: Secondary | ICD-10-CM

## 2018-04-19 ENCOUNTER — Encounter: Payer: Self-pay | Admitting: Family Medicine

## 2018-04-19 ENCOUNTER — Ambulatory Visit (INDEPENDENT_AMBULATORY_CARE_PROVIDER_SITE_OTHER): Payer: Managed Care, Other (non HMO) | Admitting: Family Medicine

## 2018-04-19 ENCOUNTER — Other Ambulatory Visit: Payer: Self-pay

## 2018-04-19 VITALS — BP 179/97 | HR 79 | Temp 98.7°F | Ht 65.0 in | Wt 148.0 lb

## 2018-04-19 DIAGNOSIS — R3915 Urgency of urination: Secondary | ICD-10-CM

## 2018-04-19 DIAGNOSIS — E782 Mixed hyperlipidemia: Secondary | ICD-10-CM | POA: Diagnosis not present

## 2018-04-19 DIAGNOSIS — R1310 Dysphagia, unspecified: Secondary | ICD-10-CM

## 2018-04-19 DIAGNOSIS — I10 Essential (primary) hypertension: Secondary | ICD-10-CM | POA: Diagnosis not present

## 2018-04-19 DIAGNOSIS — E559 Vitamin D deficiency, unspecified: Secondary | ICD-10-CM

## 2018-04-19 DIAGNOSIS — R195 Other fecal abnormalities: Secondary | ICD-10-CM

## 2018-04-19 DIAGNOSIS — R3121 Asymptomatic microscopic hematuria: Secondary | ICD-10-CM

## 2018-04-19 DIAGNOSIS — E1169 Type 2 diabetes mellitus with other specified complication: Secondary | ICD-10-CM | POA: Diagnosis not present

## 2018-04-19 DIAGNOSIS — E785 Hyperlipidemia, unspecified: Secondary | ICD-10-CM

## 2018-04-19 LAB — MICROSCOPIC EXAMINATION
Bacteria, UA: NONE SEEN
Epithelial Cells (non renal): NONE SEEN /hpf (ref 0–10)
Renal Epithel, UA: NONE SEEN /hpf

## 2018-04-19 LAB — URINALYSIS, COMPLETE
Bilirubin, UA: NEGATIVE
LEUKOCYTES UA: NEGATIVE
Nitrite, UA: NEGATIVE
Protein, UA: NEGATIVE
RBC, UA: NEGATIVE
SPEC GRAV UA: 1.02 (ref 1.005–1.030)
Urobilinogen, Ur: 0.2 mg/dL (ref 0.2–1.0)
pH, UA: 6 (ref 5.0–7.5)

## 2018-04-19 LAB — BAYER DCA HB A1C WAIVED: HB A1C (BAYER DCA - WAIVED): 9.7 % — ABNORMAL HIGH (ref <7.0)

## 2018-04-19 MED ORDER — METFORMIN HCL ER 500 MG PO TB24: ORAL_TABLET | ORAL | 3 refills | Status: DC

## 2018-04-19 MED ORDER — VALSARTAN 160 MG PO TABS: 160.0000 mg | ORAL_TABLET | Freq: Every day | ORAL | 3 refills | Status: DC

## 2018-04-19 NOTE — Progress Notes (Signed)
Subjective:    Patient ID: Darrell [name], male    DOB: 05/21/1955, 63 y.o.   MRN: [id number]  HPI Pt here for follow up and management of chronic medical problems which includes hypertension and diabetes. He is taking medication regularly.  Unfortunately patient does not take his medicines regularly and this is includes his diabetic medicines as well as his blood pressure medicines.  He also has hyperlipidemia.  His blood pressure today was elevated on 2 occasions.  His current medicines include only metformin and Januvia.  He is having some urgency when he passes his water.  He will get lab work today.  Patient denies any chest pain pressure tightness or shortness of breath anymore than usual.  He does have ongoing problems with swallowing grainy type foods including corn bread and [place].  One time he had to have an endoscopy and dilatation and he feels like he is at the point of needing this again.  He denies any blood in the stool but does occasionally have some loose bowel movements that may be associated with his metformin.  I asked him why he would not take his blood pressure medicine more regularly and he really will not give me a good reason.  We need to get him started back on medication for his blood pressure my suggestion is to start him back on the valsartan first and then we can add amlodipine to this in the future.  He does have urinary urgency so we will check a urine today.  Unfortunately patient is poorly compliant with his medicines and I scolded him to some extent about this and he is very nice and apologized and says he will try to do better.    Patient Active Problem List   Diagnosis Date Noted   Benign prostatic hyperplasia without lower urinary tract symptoms 06/08/2017   Noncompliance with medications 06/08/2017   Hypertension 01/30/2015   Hyperlipidemia 01/30/2015   Hypokalemia 01/30/2015   Type 2 diabetes mellitus with hyperlipidemia ([facility]) 01/30/2015   Outpatient  Encounter Medications as of 04/19/2018  Medication Sig   Continuous Blood Gluc Receiver (FREESTYLE LIBRE READER) DEVI 1 applicator by Does not apply route 2 (two) times daily as needed.   Continuous Blood Gluc Sensor (FREESTYLE LIBRE 14 DAY SENSOR) MISC 1 application by Does not apply route every 14 (fourteen) days.   metFORMIN (GLUCOPHAGE) 500 MG tablet Take 1 tablet (500 mg total) by mouth 2 (two) times daily.   sitaGLIPtin (JANUVIA) 100 MG tablet Take 1 tablet (100 mg total) by mouth daily.   amLODipine (NORVASC) 5 MG tablet Take 1 tablet (5 mg total) by mouth daily. (Patient not taking: Reported on 04/19/2018)   hydrochlorothiazide (HYDRODIURIL) 25 MG tablet Take 1 tablet (25 mg total) by mouth daily. (Patient not taking: Reported on 04/19/2018)   sildenafil (REVATIO) 20 MG tablet Use 2 to 5 pills once daily as needed for ED (Patient not taking: Reported on 06/08/2017)   valsartan (DIOVAN) 160 MG tablet Take 1 tablet (160 mg total) by mouth daily. (Patient not taking: Reported on 04/19/2018)   [DISCONTINUED] cholecalciferol (VITAMIN D) 1000 units tablet Take 1,000 Units by mouth daily.   [DISCONTINUED] rosuvastatin (CRESTOR) 5 MG tablet Take 1 tablet (5 mg total) by mouth daily. For cholesterol   No facility-administered encounter medications on file as of 04/19/2018.      Review of Systems  Constitutional: Negative.   HENT: Negative.   Eyes: Negative.   Respiratory: Negative.   Cardiovascular:  Negative.   Gastrointestinal: Negative.   Endocrine: Negative.   Genitourinary: Positive for urgency.  Musculoskeletal: Negative.   Skin: Negative.   Allergic/Immunologic: Negative.   Neurological: Negative.   Hematological: Negative.   Psychiatric/Behavioral: Negative.        Objective:   Physical Exam Vitals signs and nursing note reviewed.  Constitutional:      General: He is not in acute distress.    Appearance: Normal appearance. He is well-developed and normal weight. He is  not ill-appearing.  HENT:     Head: Normocephalic and atraumatic.     Right Ear: Tympanic membrane, ear canal and external ear normal. There is no impacted cerumen.     Left Ear: Tympanic membrane, ear canal and external ear normal. There is no impacted cerumen.     Nose: No congestion or rhinorrhea.     Mouth/Throat:     Comments: Difficult to see posterior throat due to gag reflex. Eyes:     General: No scleral icterus.       Right eye: No discharge.        Left eye: No discharge.     Conjunctiva/sclera: Conjunctivae normal.     Pupils: Pupils are equal, round, and reactive to light.  Neck:     Musculoskeletal: Normal range of motion and neck supple.     Thyroid: No thyromegaly.     Vascular: No carotid bruit.     Trachea: No tracheal deviation.     Comments: No bruits thyromegaly or anterior cervical adenopathy Cardiovascular:     Rate and Rhythm: Normal rate and regular rhythm.     Pulses: Normal pulses.     Heart sounds: Normal heart sounds. No murmur.     Comments: Heart is regular at 72/min with no edema and good pedal pulses Pulmonary:     Effort: Pulmonary effort is normal. No respiratory distress.     Breath sounds: Normal breath sounds. No wheezing or rales.  Chest:     Chest wall: No tenderness.  Abdominal:     General: Bowel sounds are normal.     Palpations: Abdomen is soft. There is no mass.     Tenderness: There is no abdominal tenderness.     Comments: No liver or spleen enlargement.  No masses.  No bruits.  No inguinal adenopathy.  Musculoskeletal: Normal range of motion.        General: No tenderness.     Right lower leg: No edema.     Left lower leg: No edema.  Lymphadenopathy:     Cervical: No cervical adenopathy.  Skin:    General: Skin is warm and dry.     Findings: No rash.  Neurological:     General: No focal deficit present.     Mental Status: He is alert and oriented to person, place, and time.     Cranial Nerves: No cranial nerve deficit.      Deep Tendon Reflexes: Reflexes are normal and symmetric.  Psychiatric:        Mood and Affect: Mood normal.        Behavior: Behavior normal.        Thought Content: Thought content normal.        Judgment: Judgment normal.     Comments: Mood affect and behavior for this patient are normal for him.    BP (!) 186/99 (BP Location: Left Arm)    Pulse 79    Temp 98.7 F (37.1 C) (Oral)  Ht '5\' 5"'$  (1.651 m)    Wt 148 lb (67.1 kg)    BMI 24.63 kg/m   Pete blood pressure with manual cuff regular cuff was 184/100 in the left arm sitting       Assessment & Plan:  1. Essential hypertension -Blood pressure is too high and patient has not been taking medicine regularly and recently not at all.  He was start back on his Diovan 160 mg daily generic and have blood pressure rechecked here in 10 days. - BMP8+EGFR - CBC with Differential/Platelet - Hepatic function panel  2. Mixed hyperlipidemia -Continue with aggressive therapeutic lifestyle changes and possibly consider restarting Crestor when lab work is returned - Lipid panel - CBC with Differential/Platelet  3. Type 2 diabetes mellitus with hyperlipidemia ([facility]) -Globin A1c was elevated at 9.7%.  We will go ahead and increase his metformin and changed to the extended release because of loose bowel movements and have him take 1000 in the morning and 500 in the evening. - CBC with Differential/Platelet - Bayer DCA Hb A1c Waived  4. Urgency of urination -Patient has blood in the urine and we will recheck a urinalysis in 10 days.  If this blood in the urine persists without pyuria he will need to be referred to the urologist. - Urinalysis, Complete - Urine Culture - CBC with Differential/Platelet  5. Vitamin D deficiency -Continue with vitamin D replacement - VITAMIN D 25 Hydroxy (Vit-D Deficiency, Fractures)  6. Asymptomatic microscopic hematuria -Recheck urine in 10 days  7. Dysphagia, unspecified type -Refer to gastroenterologist  for further evaluation and possible dilatation  8. Loose bowel movement -Change metformin to extended release and increase the dose because of elevated A1c  Meds ordered this encounter  Medications   valsartan (DIOVAN) 160 MG tablet    Sig: Take 1 tablet (160 mg total) by mouth daily.    Dispense:  90 tablet    Refill:  3   metFORMIN (GLUCOPHAGE XR) 500 MG 24 hr tablet    Sig: Take 2 tabs in the AM and 1 tab PM    Dispense:  270 tablet    Refill:  3   Patient Instructions  Continue current medications. Continue good therapeutic lifestyle changes which include good diet and exercise. Fall precautions discussed with patient. If an FOBT was given today- please return it to our front desk. If you are over 92 years old - you may need Prevnar 83 or the adult Pneumonia vaccine.  **Flu shots are available--- please call and schedule a FLU-CLINIC appointment**  After your visit with [place] today you will receive a survey in the mail or online from [employer] regarding your care with [place]. Please take a moment to fill this out. Your feedback is very important to [place] as you can help [place] better understand your patient needs as well as improve your experience and satisfaction. WE CARE ABOUT YOU!!!   Watch sodium intake Drink more water Get more exercise Practice good hand and respiratory hygiene Start back on Diovan 160  1 daily Take Januvia and metformin regularly Record blood pressures weekly on visits to this office by the nurse The next step would be to add back the amlodipine or increase the Diovan. Repeat blood pressure in 10 days and repeat urinalysis because of blood in the urine Schedule patient for visit to gastroenterologist for endoscopy due to difficulty swallowing Increase metformin and changed to extended release and have him take at thousand in the morning and 500  in the evening   Arrie Senate MD

## 2018-04-19 NOTE — Patient Instructions (Addendum)
Continue current medications. Continue good therapeutic lifestyle changes which include good diet and exercise. Fall precautions discussed with patient. If an FOBT was given today- please return it to our front desk. If you are over 61 years old - you may need Prevnar 58 or the adult Pneumonia vaccine.  **Flu shots are available--- please call and schedule a FLU-CLINIC appointment**  After your visit with [place] today you will receive a survey in the mail or online from [employer] regarding your care with [place]. Please take a moment to fill this out. Your feedback is very important to [place] as you can help [place] better understand your patient needs as well as improve your experience and satisfaction. WE CARE ABOUT YOU!!!   Watch sodium intake Drink more water Get more exercise Practice good hand and respiratory hygiene Start back on Diovan 160  1 daily Take Januvia and metformin regularly Record blood pressures weekly on visits to this office by the nurse The next step would be to add back the amlodipine or increase the Diovan. Repeat blood pressure in 10 days and repeat urinalysis because of blood in the urine Schedule patient for visit to gastroenterologist for endoscopy due to difficulty swallowing Increase metformin and changed to extended release and have him take at thousand in the morning and 500 in the evening

## 2018-04-20 LAB — CBC WITH DIFFERENTIAL/PLATELET
BASOS: 1 %
Basophils Absolute: 0.1 10*3/uL (ref 0.0–0.2)
EOS (ABSOLUTE): 0.2 10*3/uL (ref 0.0–0.4)
EOS: 2 %
HEMOGLOBIN: 14.3 g/dL (ref 13.0–17.7)
Hematocrit: 40.3 % (ref 37.5–51.0)
Immature Grans (Abs): 0 10*3/uL (ref 0.0–0.1)
Immature Granulocytes: 0 %
LYMPHS ABS: 1.7 10*3/uL (ref 0.7–3.1)
Lymphs: 24 %
MCH: 32.9 pg (ref 26.6–33.0)
MCHC: 35.5 g/dL (ref 31.5–35.7)
MCV: 93 fL (ref 79–97)
MONOCYTES: 6 %
MONOS ABS: 0.4 10*3/uL (ref 0.1–0.9)
Neutrophils Absolute: 4.7 10*3/uL (ref 1.4–7.0)
Neutrophils: 67 %
Platelets: 228 10*3/uL (ref 150–450)
RBC: 4.35 x10E6/uL (ref 4.14–5.80)
RDW: 11.9 % (ref 11.6–15.4)
WBC: 7.1 10*3/uL (ref 3.4–10.8)

## 2018-04-20 LAB — HEPATIC FUNCTION PANEL
ALBUMIN: 4.6 g/dL (ref 3.8–4.9)
ALK PHOS: 58 IU/L (ref 39–117)
ALT: 11 IU/L (ref 0–44)
AST: 11 IU/L (ref 0–40)
Bilirubin Total: 0.8 mg/dL (ref 0.0–1.2)
Bilirubin, Direct: 0.18 mg/dL (ref 0.00–0.40)
Total Protein: 6.8 g/dL (ref 6.0–8.5)

## 2018-04-20 LAB — BMP8+EGFR
BUN/Creatinine Ratio: 12 (ref 10–24)
BUN: 10 mg/dL (ref 8–27)
CALCIUM: 9.6 mg/dL (ref 8.6–10.2)
CHLORIDE: 99 mmol/L (ref 96–106)
CO2: 27 mmol/L (ref 20–29)
Creatinine, Ser: 0.84 mg/dL (ref 0.76–1.27)
GFR calc Af Amer: 110 mL/min/{1.73_m2} (ref >59)
GFR calc non Af Amer: 95 mL/min/{1.73_m2} (ref >59)
GLUCOSE: 340 mg/dL — AB (ref 65–99)
POTASSIUM: 3.7 mmol/L (ref 3.5–5.2)
Sodium: 142 mmol/L (ref 134–144)

## 2018-04-20 LAB — VITAMIN D 25 HYDROXY (VIT D DEFICIENCY, FRACTURES): Vit D, 25-Hydroxy: 15.7 ng/mL — ABNORMAL LOW (ref 30.0–100.0)

## 2018-04-20 LAB — LIPID PANEL
CHOLESTEROL TOTAL: 221 mg/dL — AB (ref 100–199)
Chol/HDL Ratio: 4.9 ratio (ref 0.0–5.0)
HDL: 45 mg/dL (ref >39)
LDL Calculated: 131 mg/dL — ABNORMAL HIGH (ref 0–99)
Triglycerides: 223 mg/dL — ABNORMAL HIGH (ref 0–149)
VLDL CHOLESTEROL CAL: 45 mg/dL — AB (ref 5–40)

## 2018-04-21 LAB — URINE CULTURE

## 2018-04-27 ENCOUNTER — Other Ambulatory Visit: Payer: Self-pay | Admitting: *Deleted

## 2018-04-27 ENCOUNTER — Telehealth: Payer: Self-pay | Admitting: Family

## 2018-04-27 MED ORDER — ICOSAPENT ETHYL 1 G PO CAPS: 2.0000 g | ORAL_CAPSULE | Freq: Two times a day (BID) | ORAL | 1 refills | Status: DC

## 2018-04-27 MED ORDER — ROSUVASTATIN CALCIUM 5 MG PO TABS: 5.0000 mg | ORAL_TABLET | Freq: Every day | ORAL | 3 refills | Status: DC

## 2018-04-27 MED ORDER — VITAMIN D (ERGOCALCIFEROL) 1.25 MG (50000 UNIT) PO CAPS: 50000.0000 [IU] | ORAL_CAPSULE | ORAL | 1 refills | Status: DC

## 2018-04-27 NOTE — Telephone Encounter (Signed)
Aware of all lab results and medications sent to pharmacy.

## 2018-04-27 NOTE — Telephone Encounter (Signed)
Pt states he was calling to get his test results that someone had tried to call him with them

## 2018-04-28 ENCOUNTER — Telehealth: Payer: Self-pay | Admitting: *Deleted

## 2018-04-28 NOTE — Telephone Encounter (Signed)
Notes recorded by Larina Bras, CMA on 04/27/2018 at 9:48 AM EDT Left voicemail for patient to call back. ------  Notes recorded by Larina Bras, CMA on 04/26/2018 at 10:41 AM EDT Left message for patient to call back. ------  Notes recorded by Larina Bras, CMA on 04/24/2018 at 4:46 PM EDT Attempted to reach pt but got busy signal x 2. Will attempt to call back at a later time. ------  Notes recorded by Dannial Monarch, LPN on 7/34/1937 at 90:24 AM EDT Na-cb 3/20 ------  Notes recorded by Chipper Herb, MD on 04/21/2018 at 7:24 AM EDT The urine culture report was returned with minimal growth of mixed urogenital flora with no specific agent to treat. He should continue to drink plenty water and fluids and stay well-hydrated. Please remind him take his medications regularly including the Crestor which was initiated yesterday, the increased metformin and his blood pressure medicine regularly. ------  Notes recorded by Zannie Cove, LPN on 0/97/3532 at 9:92 PM EDT NA 3/19-jhb

## 2018-04-28 NOTE — Telephone Encounter (Signed)
-----   Message from Jerene Bears, MD sent at 04/20/2018  8:51 AM EDT ----- Dysphagia referral from Dr. Reather Converse ----- Message ----- From: Chipper Herb, MD Sent: 04/20/2018   8:34 AM EDT To: Zannie Cove, LPN, Jerene Bears, MD  The urinalysis has 11-30 RBC and this is more than would be normal.  The WBC count is within normal limits.  No bacteria were seen.  Calcium oxalate crystals were present and this could mean a kidney stone.  The patient is not having any symptoms. The blood sugar is elevated at 340.  This is obviously way too high.  The patient has to recommit himself to doing better with diet and exercise.  He should go ahead and increase his metformin as directed and take at least 1000 mg in the morning and 500 in the evening.  He should continue with his Januvia.  He needs to do better!  He can do better!  He needs to recommit himself to taking better care of himself!  No one can do this for him++++++ The BMP had normal electrolytes including potassium sodium and chloride Cholesterol numbers with traditional lipid testing have an LDL-C or bad cholesterol that is elevated at 131 and should be less than 100.  Triglycerides are elevated at 223 and should be less than 150.  Try to do better with diet and exercise.  Please add back Crestor from previous dose that he was taking and also add Vascepa 2 g twice daily+++++ the patient should have repeat liver function test in 4 weeks.. The CBC has a normal white blood cell count.  Hemoglobin is good at 14.3 and the platelet count is adequate. All liver function test are within normal limits The vitamin D level is low at 15.7.  He has a normal creatinine.  Please start vitamin D, 50,000 units, once weekly give him #12 with refills.  The vitamin D level will need to be repeated in 3 months. The hemoglobin A1c is obviously too high at 9.7.  He can help this by increasing his metformin as directed and about buckling down and doing better with diet and  exercise. A referral has been made Dr. Hilarie Fredrickson for an endoscopy because this patient is having trouble swallowing foods.  This will most likely be scheduled at a future date.

## 2018-04-28 NOTE — Telephone Encounter (Signed)
We have attempted on multiple occasions to reach patient to schedule him for visit to discuss dysphagia. I will send correspondence to referring provider to advise.

## 2018-06-01 ENCOUNTER — Other Ambulatory Visit: Payer: Self-pay | Admitting: *Deleted

## 2018-06-01 MED ORDER — SITAGLIPTIN PHOSPHATE 100 MG PO TABS: 100.0000 mg | ORAL_TABLET | Freq: Every day | ORAL | 3 refills | Status: DC

## 2018-06-06 ENCOUNTER — Telehealth: Payer: Self-pay

## 2018-06-06 MED ORDER — ROSUVASTATIN CALCIUM 5 MG PO TABS: 5.0000 mg | ORAL_TABLET | Freq: Every day | ORAL | 0 refills | Status: DC

## 2018-06-06 NOTE — Telephone Encounter (Signed)
erroneous

## 2018-06-08 ENCOUNTER — Other Ambulatory Visit: Payer: Self-pay | Admitting: *Deleted

## 2018-06-08 MED ORDER — VITAMIN D (ERGOCALCIFEROL) 1.25 MG (50000 UNIT) PO CAPS: 50000.0000 [IU] | ORAL_CAPSULE | ORAL | 0 refills | Status: DC

## 2018-06-22 ENCOUNTER — Telehealth: Payer: Self-pay | Admitting: Family

## 2018-07-14 ENCOUNTER — Emergency Department (HOSPITAL_COMMUNITY): Payer: Managed Care, Other (non HMO)

## 2018-07-14 ENCOUNTER — Other Ambulatory Visit: Payer: Self-pay

## 2018-07-14 ENCOUNTER — Observation Stay (HOSPITAL_COMMUNITY): Payer: Managed Care, Other (non HMO)

## 2018-07-14 ENCOUNTER — Observation Stay (HOSPITAL_COMMUNITY)
Admission: EM | Admit: 2018-07-14 | Discharge: 2018-07-15 | Disposition: A | Discharge status: Home / Self Care | Payer: Managed Care, Other (non HMO) | Attending: Internal Medicine | Admitting: Internal Medicine

## 2018-07-14 DIAGNOSIS — R9431 Abnormal electrocardiogram [ECG] [EKG]: Secondary | ICD-10-CM | POA: Diagnosis present

## 2018-07-14 DIAGNOSIS — G459 Transient cerebral ischemic attack, unspecified: Secondary | ICD-10-CM

## 2018-07-14 DIAGNOSIS — Z7982 Long term (current) use of aspirin: Secondary | ICD-10-CM | POA: Diagnosis not present

## 2018-07-14 DIAGNOSIS — E1169 Type 2 diabetes mellitus with other specified complication: Secondary | ICD-10-CM | POA: Diagnosis not present

## 2018-07-14 DIAGNOSIS — Z85828 Personal history of other malignant neoplasm of skin: Secondary | ICD-10-CM | POA: Diagnosis not present

## 2018-07-14 DIAGNOSIS — Z20828 Contact with and (suspected) exposure to other viral communicable diseases: Secondary | ICD-10-CM | POA: Diagnosis not present

## 2018-07-14 DIAGNOSIS — Z87891 Personal history of nicotine dependence: Secondary | ICD-10-CM | POA: Insufficient documentation

## 2018-07-14 DIAGNOSIS — I4510 Unspecified right bundle-branch block: Secondary | ICD-10-CM

## 2018-07-14 DIAGNOSIS — Z79899 Other long term (current) drug therapy: Secondary | ICD-10-CM | POA: Diagnosis not present

## 2018-07-14 DIAGNOSIS — I10 Essential (primary) hypertension: Secondary | ICD-10-CM | POA: Insufficient documentation

## 2018-07-14 DIAGNOSIS — E119 Type 2 diabetes mellitus without complications: Secondary | ICD-10-CM | POA: Diagnosis not present

## 2018-07-14 DIAGNOSIS — Z7984 Long term (current) use of oral hypoglycemic drugs: Secondary | ICD-10-CM | POA: Insufficient documentation

## 2018-07-14 DIAGNOSIS — I161 Hypertensive emergency: Secondary | ICD-10-CM

## 2018-07-14 DIAGNOSIS — R4701 Aphasia: Secondary | ICD-10-CM | POA: Diagnosis present

## 2018-07-14 DIAGNOSIS — E785 Hyperlipidemia, unspecified: Secondary | ICD-10-CM

## 2018-07-14 HISTORY — DX: Transient cerebral ischemic attack, unspecified: G45.9

## 2018-07-14 HISTORY — DX: Aphasia: R47.01

## 2018-07-14 HISTORY — DX: Hypertensive emergency: I16.1

## 2018-07-14 HISTORY — DX: Abnormal electrocardiogram (ECG) (EKG): R94.31

## 2018-07-14 LAB — COMPREHENSIVE METABOLIC PANEL
ALT: 16 U/L (ref 0–44)
AST: 37 U/L (ref 15–41)
Albumin: 4.1 g/dL (ref 3.5–5.0)
Alkaline Phosphatase: 29 U/L — ABNORMAL LOW (ref 38–126)
Anion gap: 10 (ref 5–15)
BUN: 11 mg/dL (ref 6–20)
CO2: 25 mmol/L (ref 22–32)
Calcium: 9.1 mg/dL (ref 8.9–10.3)
Chloride: 104 mmol/L (ref 98–111)
Creatinine, Ser: 0.81 mg/dL (ref 0.61–1.24)
GFR calc Af Amer: >60 mL/min (ref >60)
GFR calc non Af Amer: >60 mL/min (ref >60)
Glucose, Bld: 147 mg/dL — ABNORMAL HIGH (ref 70–99)
Potassium: 4.1 mmol/L (ref 3.5–5.1)
Sodium: 139 mmol/L (ref 135–145)
Total Bilirubin: 1.8 mg/dL — ABNORMAL HIGH (ref 0.3–1.2)
Total Protein: 6.4 g/dL — ABNORMAL LOW (ref 6.5–8.1)

## 2018-07-14 LAB — RAPID URINE DRUG SCREEN, HOSP PERFORMED
Amphetamines: NOT DETECTED
Barbiturates: NOT DETECTED
Benzodiazepines: NOT DETECTED
Cocaine: NOT DETECTED
Opiates: NOT DETECTED
Tetrahydrocannabinol: NOT DETECTED

## 2018-07-14 LAB — CBC WITH DIFFERENTIAL/PLATELET
Abs Immature Granulocytes: 0.03 10*3/uL (ref 0.00–0.07)
Basophils Absolute: 0 10*3/uL (ref 0.0–0.1)
Basophils Relative: 1 %
Eosinophils Absolute: 0.1 10*3/uL (ref 0.0–0.5)
Eosinophils Relative: 1 %
HCT: 38.9 % — ABNORMAL LOW (ref 39.0–52.0)
Hemoglobin: 13.6 g/dL (ref 13.0–17.0)
Immature Granulocytes: 0 %
Lymphocytes Relative: 16 %
Lymphs Abs: 1.3 10*3/uL (ref 0.7–4.0)
MCH: 32.2 pg (ref 26.0–34.0)
MCHC: 35 g/dL (ref 30.0–36.0)
MCV: 92 fL (ref 80.0–100.0)
Monocytes Absolute: 0.5 10*3/uL (ref 0.1–1.0)
Monocytes Relative: 6 %
Neutro Abs: 5.9 10*3/uL (ref 1.7–7.7)
Neutrophils Relative %: 76 %
Platelets: 198 10*3/uL (ref 150–400)
RBC: 4.23 MIL/uL (ref 4.22–5.81)
RDW: 11.9 % (ref 11.5–15.5)
WBC: 7.8 10*3/uL (ref 4.0–10.5)
nRBC: 0 % (ref 0.0–0.2)

## 2018-07-14 LAB — I-STAT CHEM 8, ED
BUN: 10 mg/dL (ref 6–20)
Calcium, Ion: 1.19 mmol/L (ref 1.15–1.40)
Chloride: 101 mmol/L (ref 98–111)
Creatinine, Ser: 0.8 mg/dL (ref 0.61–1.24)
Glucose, Bld: 126 mg/dL — ABNORMAL HIGH (ref 70–99)
HCT: 38 % — ABNORMAL LOW (ref 39.0–52.0)
Hemoglobin: 12.9 g/dL — ABNORMAL LOW (ref 13.0–17.0)
Potassium: 3.4 mmol/L — ABNORMAL LOW (ref 3.5–5.1)
Sodium: 140 mmol/L (ref 135–145)
TCO2: 27 mmol/L (ref 22–32)

## 2018-07-14 LAB — URINALYSIS, ROUTINE W REFLEX MICROSCOPIC
Bilirubin Urine: NEGATIVE
Glucose, UA: NEGATIVE mg/dL
Hgb urine dipstick: NEGATIVE
Ketones, ur: NEGATIVE mg/dL
Leukocytes,Ua: NEGATIVE
Nitrite: NEGATIVE
Protein, ur: NEGATIVE mg/dL
Specific Gravity, Urine: 1.033 — ABNORMAL HIGH (ref 1.005–1.030)
pH: 7 (ref 5.0–8.0)

## 2018-07-14 LAB — GLUCOSE, CAPILLARY
Glucose-Capillary: 95 mg/dL (ref 70–99)
Glucose-Capillary: 132 mg/dL — ABNORMAL HIGH (ref 70–99)

## 2018-07-14 LAB — PROTIME-INR
INR: 1.1 (ref 0.8–1.2)
Prothrombin Time: 14 seconds (ref 11.4–15.2)

## 2018-07-14 LAB — APTT: aPTT: 24 seconds (ref 24–36)

## 2018-07-14 LAB — TSH: TSH: 0.698 u[IU]/mL (ref 0.350–4.500)

## 2018-07-14 LAB — ETHANOL: Alcohol, Ethyl (B): <10 mg/dL (ref <10)

## 2018-07-14 MED ORDER — SENNOSIDES-DOCUSATE SODIUM 8.6-50 MG PO TABS: 1.0000 | ORAL_TABLET | Freq: Every evening | ORAL | Status: DC | PRN

## 2018-07-14 MED ORDER — ACETAMINOPHEN 325 MG PO TABS
325.0000 mg | ORAL_TABLET | Freq: Once | ORAL | Status: AC
  Administered 2018-07-14: 325 mg via ORAL
  Filled 2018-07-14: qty 1

## 2018-07-14 MED ORDER — IOHEXOL 350 MG/ML SOLN
75.0000 mL | Freq: Once | INTRAVENOUS | Status: AC | PRN
  Administered 2018-07-14: 75 mL via INTRAVENOUS

## 2018-07-14 MED ORDER — ONDANSETRON 4 MG PO TBDP
4.0000 mg | ORAL_TABLET | Freq: Once | ORAL | Status: AC
  Administered 2018-07-14: 12:00:00 4 mg via ORAL
  Filled 2018-07-14: qty 1

## 2018-07-14 MED ORDER — ACETAMINOPHEN 325 MG PO TABS: 650.0000 mg | ORAL_TABLET | ORAL | Status: DC | PRN

## 2018-07-14 MED ORDER — ACETAMINOPHEN 650 MG RE SUPP: 650.0000 mg | RECTAL | Status: DC | PRN

## 2018-07-14 MED ORDER — ASPIRIN EC 81 MG PO TBEC
81.0000 mg | DELAYED_RELEASE_TABLET | Freq: Every day | ORAL | Status: DC
  Administered 2018-07-14: 12:00:00 81 mg via ORAL
  Filled 2018-07-14: qty 1

## 2018-07-14 MED ORDER — MAGNESIUM SULFATE IN D5W 1-5 GM/100ML-% IV SOLN
1.0000 g | Freq: Once | INTRAVENOUS | Status: AC
  Administered 2018-07-14: 1 g via INTRAVENOUS
  Filled 2018-07-14: qty 100

## 2018-07-14 MED ORDER — CLOPIDOGREL BISULFATE 75 MG PO TABS
75.0000 mg | ORAL_TABLET | Freq: Every day | ORAL | Status: DC
  Administered 2018-07-15: 09:00:00 75 mg via ORAL
  Filled 2018-07-14: qty 1

## 2018-07-14 MED ORDER — ATORVASTATIN CALCIUM 80 MG PO TABS
80.0000 mg | ORAL_TABLET | Freq: Every day | ORAL | Status: DC
  Administered 2018-07-14: 80 mg via ORAL
  Filled 2018-07-14: qty 1

## 2018-07-14 MED ORDER — INSULIN ASPART 100 UNIT/ML ~~LOC~~ SOLN
0.0000 [IU] | Freq: Three times a day (TID) | SUBCUTANEOUS | Status: DC
  Administered 2018-07-15: 2 [IU] via SUBCUTANEOUS
  Administered 2018-07-15: 08:00:00 1 [IU] via SUBCUTANEOUS

## 2018-07-14 MED ORDER — HYDRALAZINE HCL 20 MG/ML IJ SOLN
10.0000 mg | INTRAMUSCULAR | Status: DC | PRN
  Administered 2018-07-15: 10 mg via INTRAVENOUS
  Filled 2018-07-14 (×2): qty 1

## 2018-07-14 MED ORDER — STROKE: EARLY STAGES OF RECOVERY BOOK
Freq: Once | Status: AC
  Administered 2018-07-14: 1
  Filled 2018-07-14: qty 1

## 2018-07-14 MED ORDER — ASPIRIN EC 81 MG PO TBEC
81.0000 mg | DELAYED_RELEASE_TABLET | Freq: Every day | ORAL | Status: DC
  Administered 2018-07-15: 09:00:00 81 mg via ORAL
  Filled 2018-07-14: qty 1

## 2018-07-14 MED ORDER — ACETAMINOPHEN 160 MG/5ML PO SOLN: 650.0000 mg | ORAL | Status: DC | PRN

## 2018-07-14 MED ORDER — ENOXAPARIN SODIUM 40 MG/0.4ML ~~LOC~~ SOLN
40.0000 mg | SUBCUTANEOUS | Status: DC
  Administered 2018-07-14 – 2018-07-15 (×2): 40 mg via SUBCUTANEOUS
  Filled 2018-07-14 (×2): qty 0.4

## 2018-07-14 MED ORDER — INSULIN ASPART 100 UNIT/ML ~~LOC~~ SOLN: 0.0000 [IU] | Freq: Every day | SUBCUTANEOUS | Status: DC

## 2018-07-14 MED ORDER — CLOPIDOGREL BISULFATE 300 MG PO TABS
300.0000 mg | ORAL_TABLET | Freq: Once | ORAL | Status: AC
  Administered 2018-07-14: 300 mg via ORAL
  Filled 2018-07-14: qty 1

## 2018-07-14 MED ORDER — IRBESARTAN 150 MG PO TABS
150.0000 mg | ORAL_TABLET | Freq: Every day | ORAL | Status: DC
  Administered 2018-07-14 – 2018-07-15 (×2): 150 mg via ORAL
  Filled 2018-07-14 (×2): qty 1

## 2018-07-14 MED ORDER — HYDRALAZINE HCL 20 MG/ML IJ SOLN: 10.0000 mg | INTRAMUSCULAR | Status: DC | PRN

## 2018-07-14 NOTE — ED Notes (Signed)
Report called to 3W RN.  

## 2018-07-14 NOTE — ED Provider Notes (Signed)
[facility] EMERGENCY DEPARTMENT Provider Note   CSN: [id number] Arrival date & time: 07/14/18  5009  An emergency department physician performed an initial assessment on this suspected stroke patient at 0958.  History   Chief Complaint No chief complaint on file.   HPI Darrell [name] is a 63 y.o. male.     HPI  Patient is a 63 year old male who presents with a complaint of difficulty speaking.  He reports that this started this morning.  He was normal when he woke up but had an acute abrupt onset around 745 where he felt like he could not get his words out, he was having difficulty forming no sentences and coming across with a normal thoughts.  He states that he was having some difficulty with balance and also some visual changes at the time, he cannot clearly tell me what those changes were but states he was not having to hold onto things to walk.  He did not have any focal weakness or numbness, he did not have any difficulty with facial droop.  The symptoms have been persistent, nothing seems to make this any better, nothing seems to make it any worse, there is no associated chest pain shortness of breath headache nausea vomiting or diarrhea.  He has not been coughing or febrile.  He denies any history of prior stroke.  Review of the medical record shows that he has diabetes and hypertension.  He reports that he does take medications for this.  Past Medical History:  Diagnosis Date   Dyslipidemia    Essential hypertension, benign    Hyperlipidemia    Thyroid cyst    Type 2 diabetes mellitus ([place])    Vitamin D deficiency     Patient Active Problem List   Diagnosis Date Noted   Benign prostatic hyperplasia without lower urinary tract symptoms 06/08/2017   Noncompliance with medications 06/08/2017   Hypertension 01/30/2015   Hyperlipidemia 01/30/2015   Hypokalemia 01/30/2015   Type 2 diabetes mellitus with hyperlipidemia ([place]) 01/30/2015    Past  Surgical History:  Procedure Laterality Date   MELANOMA EXCISION     Back   WISDOM TOOTH EXTRACTION          Home Medications    Prior to Admission medications   Medication Sig Start Date End Date Taking? Authorizing Provider  aspirin EC 81 MG tablet Take 81 mg by mouth daily.   Yes [provider]  metFORMIN (GLUCOPHAGE XR) 500 MG 24 hr tablet Take 2 tabs in the AM and 1 tab PM Patient taking differently: Take 1,000 mg by mouth 2 (two) times a day.  04/19/18  Yes Chipper Herb, MD  rosuvastatin (CRESTOR) 5 MG tablet Take 1 tablet (5 mg total) by mouth daily. For cholesterol 06/06/18  Yes Chipper Herb, MD  sitaGLIPtin (JANUVIA) 100 MG tablet Take 1 tablet (100 mg total) by mouth daily. 06/01/18  Yes Chipper Herb, MD  valsartan (DIOVAN) 160 MG tablet Take 1 tablet (160 mg total) by mouth daily. 04/19/18  Yes Chipper Herb, MD  Vitamin D, Ergocalciferol, (DRISDOL) 1.25 MG (50000 UT) CAPS capsule Take 1 capsule (50,000 Units total) by mouth every 7 (seven) days. Patient taking differently: Take 50,000 Units by mouth every 7 (seven) days. On Friday 06/08/18  Yes Hawks, Christy A, FNP  amLODipine (NORVASC) 5 MG tablet Take 1 tablet (5 mg total) by mouth daily. Patient not taking: Reported on 04/19/2018 06/08/17   Chipper Herb, MD  Continuous Blood Gluc Receiver (FREESTYLE LIBRE READER) DEVI 1 applicator by Does not apply route 2 (two) times daily as needed. 06/08/17   Chipper Herb, MD  Continuous Blood Gluc Sensor (FREESTYLE LIBRE 14 DAY SENSOR) MISC 1 application by Does not apply route every 14 (fourteen) days. 06/08/17   Chipper Herb, MD  hydrochlorothiazide (HYDRODIURIL) 25 MG tablet Take 1 tablet (25 mg total) by mouth daily. Patient not taking: Reported on 04/19/2018 06/08/17   Chipper Herb, MD  Icosapent Ethyl 1 g CAPS Take 2 capsules (2 g total) by mouth 2 (two) times daily. Patient not taking: Reported on 07/14/2018 04/27/18   Chipper Herb, MD  sildenafil (REVATIO)  20 MG tablet Use 2 to 5 pills once daily as needed for ED Patient not taking: Reported on 06/08/2017 04/30/17   Timmothy Euler, MD    Family History Family History  Problem Relation Age of Onset   Pulmonary embolism Mother    Diabetes Father    Hypertension Father    Alzheimer's disease Father    Diabetes Maternal Grandmother    Colon cancer Neg Hx     Social History Social History   Tobacco Use   Smoking status: Former Smoker   Smokeless tobacco: Never Used  Substance Use Topics   Alcohol use: Yes    Comment: occasional   Drug use: No     Allergies   Patient has no known allergies.   Review of Systems Review of Systems  All other systems reviewed and are negative.    Physical Exam Updated Vital Signs BP (!) 187/86    Pulse 71    Temp 98.6 F (37 C) (Oral)    Resp 16    Ht 1.651 m (5\' 5" )    Wt 67.1 kg    SpO2 100%    BMI 24.63 kg/m   Physical Exam Vitals signs and nursing note reviewed.  Constitutional:      General: He is not in acute distress.    Appearance: He is well-developed.  HENT:     Head: Normocephalic and atraumatic.     Mouth/Throat:     Pharynx: No oropharyngeal exudate.  Eyes:     General: No scleral icterus.       Right eye: No discharge.        Left eye: No discharge.     Conjunctiva/sclera: Conjunctivae normal.     Pupils: Pupils are equal, round, and reactive to light.  Neck:     Musculoskeletal: Normal range of motion and neck supple.     Thyroid: No thyromegaly.     Vascular: No JVD.  Cardiovascular:     Rate and Rhythm: Normal rate and regular rhythm.     Heart sounds: Normal heart sounds. No murmur. No friction rub. No gallop.   Pulmonary:     Effort: Pulmonary effort is normal. No respiratory distress.     Breath sounds: Normal breath sounds. No wheezing or rales.  Abdominal:     General: Bowel sounds are normal. There is no distension.     Palpations: Abdomen is soft. There is no mass.     Tenderness: There is  no abdominal tenderness.  Musculoskeletal: Normal range of motion.        General: No tenderness.  Lymphadenopathy:     Cervical: No cervical adenopathy.  Skin:    General: Skin is warm and dry.     Findings: No erythema or rash.  Neurological:  Mental Status: He is alert.     Coordination: Coordination normal.     Comments: Speech is clear however the patient does have an expressive aphasia and has difficulty finding the right words stumbling multiple times over simple phrases, cranial nerves III through XII are intact, memory is intact, strength is normal in all 4 extremities including grips, sensation is intact to light touch and pinprick in all 4 extremities. Coordination as tested by finger-nose-finger is normal, no limb ataxia. Normal gait, normal reflexes at the patellar tendons bilaterally  Psychiatric:        Behavior: Behavior normal.      ED Treatments / Results  Labs (all labs ordered are listed, but only abnormal results are displayed) Labs Reviewed  COMPREHENSIVE METABOLIC PANEL - Abnormal; Notable for the following components:      Result Value   Glucose, Bld 147 (*)    Total Protein 6.4 (*)    Alkaline Phosphatase 29 (*)    Total Bilirubin 1.8 (*)    All other components within normal limits  CBC WITH DIFFERENTIAL/PLATELET - Abnormal; Notable for the following components:   HCT 38.9 (*)    All other components within normal limits  I-STAT CHEM 8, ED - Abnormal; Notable for the following components:   Potassium 3.4 (*)    Glucose, Bld 126 (*)    Hemoglobin 12.9 (*)    HCT 38.0 (*)    All other components within normal limits  NOVEL CORONAVIRUS, NAA (HOSPITAL ORDER, SEND-OUT TO REF LAB)  ETHANOL  PROTIME-INR  APTT  RAPID URINE DRUG SCREEN, HOSP PERFORMED  URINALYSIS, ROUTINE W REFLEX MICROSCOPIC    EKG None  Radiology Ct Angio Head W Or Wo Contrast  Result Date: 07/14/2018 CLINICAL DATA:  Stroke, follow-up. Code stroke. Speech disturbance.  Confusion. Symptoms. EXAM: CT ANGIOGRAPHY HEAD AND NECK TECHNIQUE: Multidetector CT imaging of the head and neck was performed using the standard protocol during bolus administration of intravenous contrast. Multiplanar CT image reconstructions and MIPs were obtained to evaluate the vascular anatomy. Carotid stenosis measurements (when applicable) are obtained utilizing NASCET criteria, using the distal internal carotid diameter as the denominator. CONTRAST:  28mL OMNIPAQUE IOHEXOL 350 MG/ML SOLN COMPARISON:  CT head without contrast 07/14/2018. FINDINGS: CTA NECK FINDINGS Aortic arch: A 3 vessel arch configuration is present. No significant atherosclerotic changes or stenosis is present. Right carotid system: The right common carotid artery is within normal limits. The bifurcation demonstrates minimal scratched at bifurcation is unremarkable. Minimal atherosclerotic changes are noted in the carotid bulb. Cervical right ICA is otherwise normal. Left carotid system: The left common carotid artery is within normal limits. Bifurcation is unremarkable. Minimal atherosclerotic changes are noted within the carotid bulb. The cervical left ICA is otherwise normal. Vertebral arteries: Vertebral arteries are relatively small bilaterally. Both vertebral arteries originate from the subclavian arteries without a significant stenosis. There is no focal stenosis or vascular injury to either artery in the neck. Skeleton: Vertebral body heights and AP alignment are anatomic. There straightening and slight reversal of the normal cervical lordosis. No focal lytic or blastic lesions are present. Other neck: No focal mucosal or submucosal lesions are present. Thyroid is within normal limits. Salivary glands are unremarkable. There is no significant cervical adenopathy. Upper chest: The lung apices are clear. There is no focal nodule, mass, or airspace disease. Review of the MIP images confirms the above findings CTA HEAD FINDINGS  Anterior circulation: The internal carotid arteries are within normal limits from the skull  base through the ICA termini bilaterally. There is mild narrowing of the distal left M1 segment. Anterior communicating artery is patent. ACA and MCA branch vessels are normal. Posterior circulation: PICA origins are normal bilaterally. Basilar artery is normal. Right posterior cerebral artery is of fetal type. Small right P1 segment is noted. PCA branch vessels are normal. Venous sinuses: Dural sinuses are patent. Straight sinus and deep cerebral veins are intact. Cortical veins are normal. Anatomic variants: Fetal type right posterior cerebral artery. Review of the MIP images confirms the above findings IMPRESSION: 1. Minimal atherosclerotic changes in the cavernous internal carotid arteries bilaterally without a significant stenosis. Otherwise normal CT of the neck. 2. Minimal irregularity of the distal left M1 segment without a significant stenosis. 3. CTA circle-of-Willis otherwise within normal limits. Electronically Signed   By: San Morelle M.D.   On: 07/14/2018 10:38   Ct Angio Neck W Or Wo Contrast  Result Date: 07/14/2018 CLINICAL DATA:  Stroke, follow-up. Code stroke. Speech disturbance. Confusion. Symptoms. EXAM: CT ANGIOGRAPHY HEAD AND NECK TECHNIQUE: Multidetector CT imaging of the head and neck was performed using the standard protocol during bolus administration of intravenous contrast. Multiplanar CT image reconstructions and MIPs were obtained to evaluate the vascular anatomy. Carotid stenosis measurements (when applicable) are obtained utilizing NASCET criteria, using the distal internal carotid diameter as the denominator. CONTRAST:  6mL OMNIPAQUE IOHEXOL 350 MG/ML SOLN COMPARISON:  CT head without contrast 07/14/2018. FINDINGS: CTA NECK FINDINGS Aortic arch: A 3 vessel arch configuration is present. No significant atherosclerotic changes or stenosis is present. Right carotid system: The  right common carotid artery is within normal limits. The bifurcation demonstrates minimal scratched at bifurcation is unremarkable. Minimal atherosclerotic changes are noted in the carotid bulb. Cervical right ICA is otherwise normal. Left carotid system: The left common carotid artery is within normal limits. Bifurcation is unremarkable. Minimal atherosclerotic changes are noted within the carotid bulb. The cervical left ICA is otherwise normal. Vertebral arteries: Vertebral arteries are relatively small bilaterally. Both vertebral arteries originate from the subclavian arteries without a significant stenosis. There is no focal stenosis or vascular injury to either artery in the neck. Skeleton: Vertebral body heights and AP alignment are anatomic. There straightening and slight reversal of the normal cervical lordosis. No focal lytic or blastic lesions are present. Other neck: No focal mucosal or submucosal lesions are present. Thyroid is within normal limits. Salivary glands are unremarkable. There is no significant cervical adenopathy. Upper chest: The lung apices are clear. There is no focal nodule, mass, or airspace disease. Review of the MIP images confirms the above findings CTA HEAD FINDINGS Anterior circulation: The internal carotid arteries are within normal limits from the skull base through the ICA termini bilaterally. There is mild narrowing of the distal left M1 segment. Anterior communicating artery is patent. ACA and MCA branch vessels are normal. Posterior circulation: PICA origins are normal bilaterally. Basilar artery is normal. Right posterior cerebral artery is of fetal type. Small right P1 segment is noted. PCA branch vessels are normal. Venous sinuses: Dural sinuses are patent. Straight sinus and deep cerebral veins are intact. Cortical veins are normal. Anatomic variants: Fetal type right posterior cerebral artery. Review of the MIP images confirms the above findings IMPRESSION: 1. Minimal  atherosclerotic changes in the cavernous internal carotid arteries bilaterally without a significant stenosis. Otherwise normal CT of the neck. 2. Minimal irregularity of the distal left M1 segment without a significant stenosis. 3. CTA circle-of-Willis otherwise within normal limits. Electronically  Signed   By: San Morelle M.D.   On: 07/14/2018 10:38   Ct Head Code Stroke Wo Contrast  Result Date: 07/14/2018 CLINICAL DATA:  Code stroke. Speech disturbance. Confusion. Symptoms began 0730 hours EXAM: CT HEAD WITHOUT CONTRAST TECHNIQUE: Contiguous axial images were obtained from the base of the skull through the vertex without intravenous contrast. COMPARISON:  None. FINDINGS: Brain: There chronic appearing small vessel ischemic changes affecting the cerebral hemispheric white matter. There is an old focal infarction in the left basal ganglia. No sign of acute infarction, mass lesion, hemorrhage, hydrocephalus or extra-axial collection. Vascular: No abnormal vascular finding. Skull: Negative Sinuses/Orbits: Clear/normal Other: None ASPECTS ([place] Stroke Program Early CT Score) - Ganglionic level infarction (caudate, lentiform nuclei, internal capsule, insula, M1-M3 cortex): 7 - Supraganglionic infarction (M4-M6 cortex): 3 Total score (0-10 with 10 being normal): 10 IMPRESSION: 1. No acute finding. Chronic small-vessel ischemic changes throughout the cerebral hemispheric white matter. 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 2. ASPECTS is 10. 3. These results were communicated to Dr. Leonel Ramsay at 10:09 amon 6/12/2020by text page via the [facility] messaging system. Electronically Signed   By: Nelson Chimes M.D.   On: 07/14/2018 10:10    Procedures Procedures (including critical care time)  Medications Ordered in ED Medications  clopidogrel (PLAVIX) tablet 75 mg (has no administration in time range)  aspirin EC tablet 81 mg (has no administration in time range)  iohexol (OMNIPAQUE) 350 MG/ML injection 75  mL (75 mLs Intravenous Contrast Given 07/14/18 1011)  clopidogrel (PLAVIX) tablet 300 mg (300 mg Oral Given 07/14/18 1132)     Initial Impression / Assessment and Plan / ED Course  I have reviewed the triage vital signs and the nursing notes.  Pertinent labs & imaging results that were available during my care of the patient were reviewed by me and considered in my medical decision making (see chart for details).  Clinical Course as of Jul 13 1156  Fri Jul 14, 2018  1019 Neurology has seen the patient and recommends a transient ischemic attack work-up in the hospital.  Preliminary CT scan results per the neurologist is that this is negative for an acute hemorrhage or an acute ischemic stroke, at approximately 10:20 AM the patient was reevaluated and seems to have resolution of the difficulty with expressive speech   [BM]    Clinical Course User Index [BM] Noemi Chapel, MD       The patient does not fact have an acute focal neurologic abnormality with difficulty with his speech.  The rest of his exam is fairly unremarkable.  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  I have activated a code stroke and asked the neurology team to see him.  He is at CT scan right now.  His last seen normal was about 745 AM today according to his report.  EKG performed on July 14, 2018 at 9:51 AM, this shows normal sinus rhythm with a rate of 73, indeterminate axis, right bundle branch block present, no signs of significant ST elevation or depression.  Impression abnormal EKG with right bundle branch block.  Similar EKG to 01/03/2017  D/w Dr. Tamala Julian with Hospitalist service who will coordinate the admission to the hospital  Final Clinical Impressions(s) / ED Diagnoses   Final diagnoses:  TIA (transient ischemic attack)    ED Discharge Orders    None       Noemi Chapel, MD 07/14/18 1158

## 2018-07-14 NOTE — H&P (Signed)
History and Physical    Darrell [name] UYQ:[id number] DOB: 30-May-1955 DOA: 07/14/2018  Referring MD/NP/PA: Noemi Chapel, MD PCP: Sharion Balloon, FNP  Patient coming from: Home via EMS  Chief Complaint: Difficulty speaking  I have personally briefly reviewed patient's old medical records in [place]   HPI: Darrell [name] is a 63 y.o. male with medical history significant of HTN, HLD, and DM type II; who presented with complaints of difficulty speaking.  Patient had been getting ready for work when he noted abrupt onset of symptoms around 7:45 AM.  Complained of feeling funny.  Associated symptoms included hazy bleeding, headache, difficulty writing, and felt off balance.  He did not note any focal weakness or facial droop.  Denied having any chest pain, palpitations, shortness of breath, cough, nausea, vomiting, or diarrhea symptoms.  Patient notes that his blood pressure has been elevated before but never had any problems because of this.  He drinks alcohol only occasionally and does not report any significant history of tobacco use.  ED Course: Patient presented as a code stroke and was seen by neurology.  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  He was noted to be hypertensive with blood pressure elevated up to 227/115, and all other vitals within normal limits.  Lab work was relatively unremarkable.  CT angiogram of the head and neck reveal any significant signs of stenosis.  Patient was given 300 mg of Plavix and 325 mg of aspirin. [facility] called to admit.  Review of Systems  Constitutional: Negative for chills and fever.  HENT: Negative for congestion and nosebleeds.   Eyes: Positive for blurred vision.  Respiratory: Negative for cough and shortness of breath.   Cardiovascular: Negative for chest pain, palpitations and leg swelling.  Gastrointestinal: Negative for abdominal pain, nausea and vomiting.  Genitourinary: Negative for  dysuria and frequency.  Musculoskeletal: Negative for falls, joint pain and myalgias.  Skin: Negative for itching and rash.  Neurological: Positive for speech change and headaches. Negative for loss of consciousness.       Positive for felt off balance  Psychiatric/Behavioral: Negative for memory loss and substance abuse.    Past Medical History:  Diagnosis Date   Dyslipidemia    Essential hypertension, benign    Hyperlipidemia    Thyroid cyst    Type 2 diabetes mellitus ([place])    Vitamin D deficiency     Past Surgical History:  Procedure Laterality Date   MELANOMA EXCISION     Back   WISDOM TOOTH EXTRACTION       reports that he has quit smoking. He has never used smokeless tobacco. He reports current alcohol use. He reports that he does not use drugs.  No Known Allergies  Family History  Problem Relation Age of Onset   Pulmonary embolism Mother    Diabetes Father    Hypertension Father    Alzheimer's disease Father    Diabetes Maternal Grandmother    Colon cancer Neg Hx     Prior to Admission medications   Medication Sig Start Date End Date Taking? Authorizing Provider  aspirin EC 81 MG tablet Take 81 mg by mouth daily.   Yes [provider]  metFORMIN (GLUCOPHAGE XR) 500 MG 24 hr tablet Take 2 tabs in the AM and 1 tab PM Patient taking differently: Take 1,000 mg by mouth 2 (two) times a day.  04/19/18  Yes Chipper Herb, MD  rosuvastatin (CRESTOR) 5 MG tablet Take 1 tablet (5 mg total) by mouth daily. For cholesterol 06/06/18  Yes Chipper Herb, MD  sitaGLIPtin (JANUVIA) 100 MG tablet Take 1 tablet (100 mg total) by mouth daily. 06/01/18  Yes Chipper Herb, MD  valsartan (DIOVAN) 160 MG tablet Take 1 tablet (160 mg total) by mouth daily. 04/19/18  Yes Chipper Herb, MD  Vitamin D, Ergocalciferol, (DRISDOL) 1.25 MG (50000 UT) CAPS capsule Take 1 capsule (50,000 Units total) by mouth every 7 (seven) days. Patient taking differently: Take  50,000 Units by mouth every 7 (seven) days. On Friday 06/08/18  Yes Hawks, Christy A, FNP  amLODipine (NORVASC) 5 MG tablet Take 1 tablet (5 mg total) by mouth daily. Patient not taking: Reported on 04/19/2018 06/08/17   Chipper Herb, MD  Continuous Blood Gluc Receiver (FREESTYLE LIBRE READER) DEVI 1 applicator by Does not apply route 2 (two) times daily as needed. 06/08/17   Chipper Herb, MD  Continuous Blood Gluc Sensor (FREESTYLE LIBRE 14 DAY SENSOR) MISC 1 application by Does not apply route every 14 (fourteen) days. 06/08/17   Chipper Herb, MD  hydrochlorothiazide (HYDRODIURIL) 25 MG tablet Take 1 tablet (25 mg total) by mouth daily. Patient not taking: Reported on 04/19/2018 06/08/17   Chipper Herb, MD  Icosapent Ethyl 1 g CAPS Take 2 capsules (2 g total) by mouth 2 (two) times daily. Patient not taking: Reported on 07/14/2018 04/27/18   Chipper Herb, MD  sildenafil (REVATIO) 20 MG tablet Use 2 to 5 pills once daily as needed for ED Patient not taking: Reported on 06/08/2017 04/30/17   Timmothy Euler, MD    Physical Exam:  Constitutional: Middle-age male in NAD, calm, comfortable Vitals:   07/14/18 0942 07/14/18 0954 07/14/18 1030 07/14/18 1100  BP:  (!) 197/109 (!) 227/115 (!) 187/86  Pulse:  68 67 71  Resp:  14 17 16   Temp:  98.6 F (37 C)    TempSrc:  Oral    SpO2:  99% 99% 100%  Weight: 67.1 kg     Height: 5\' 5"  (1.651 m)      Eyes: PERRL, lids and conjunctivae normal ENMT: Mucous membranes are moist. Posterior pharynx clear of any exudate or lesions.  Neck: normal, supple, no masses, no thyromegaly Respiratory: clear to auscultation bilaterally, no wheezing, no crackles. Normal respiratory effort. No accessory muscle use.  Cardiovascular: Regular rate and rhythm, no murmurs / rubs / gallops. No extremity edema. 2+ pedal pulses. No carotid bruits.  Abdomen: no tenderness, no masses palpated. No hepatosplenomegaly. Bowel sounds positive.  Musculoskeletal: no clubbing /  cyanosis. No joint deformity upper and lower extremities. Good ROM, no contractures. Normal muscle tone.  Skin: no rashes, lesions, ulcers. No induration Neurologic: CN 2-12 grossly intact. Sensation intact, DTR normal.  Expressive aphasia still noted with apraxia when asked to write. Psychiatric: Normal judgment and insight. Alert and oriented x 3. Normal mood.     Labs on Admission: I have personally reviewed following labs and imaging studies  CBC: Recent Labs  Lab 07/14/18 1030 07/14/18 1040  WBC 7.8  --   NEUTROABS 5.9  --   HGB 13.6 12.9*  HCT 38.9* 38.0*  MCV 92.0  --   PLT 198  --    Basic Metabolic Panel: Recent Labs  Lab 07/14/18 1009 07/14/18 1040  NA 139 140  K 4.1 3.4*  CL 104 101  CO2 25  --   GLUCOSE 147*  126*  BUN 11 10  CREATININE 0.81 0.80  CALCIUM 9.1  --    GFR: Estimated Creatinine Clearance: 85.4 mL/min (by C-G formula based on SCr of 0.8 mg/dL). Liver Function Tests: Recent Labs  Lab 07/14/18 1009  AST 37  ALT 16  ALKPHOS 29*  BILITOT 1.8*  PROT 6.4*  ALBUMIN 4.1   No results for input(s): LIPASE, AMYLASE in the last 168 hours. No results for input(s): AMMONIA in the last 168 hours. Coagulation Profile: Recent Labs  Lab 07/14/18 1045  INR 1.1   Cardiac Enzymes: No results for input(s): CKTOTAL, CKMB, CKMBINDEX, TROPONINI in the last 168 hours. BNP (last 3 results) No results for input(s): PROBNP in the last 8760 hours. HbA1C: No results for input(s): HGBA1C in the last 72 hours. CBG: No results for input(s): GLUCAP in the last 168 hours. Lipid Profile: No results for input(s): CHOL, HDL, LDLCALC, TRIG, CHOLHDL, LDLDIRECT in the last 72 hours. Thyroid Function Tests: No results for input(s): TSH, T4TOTAL, FREET4, T3FREE, THYROIDAB in the last 72 hours. Anemia Panel: No results for input(s): VITAMINB12, FOLATE, FERRITIN, TIBC, IRON, RETICCTPCT in the last 72 hours. Urine analysis:    Component Value Date/Time   COLORURINE  YELLOW 07/14/2018 1135   APPEARANCEUR CLEAR 07/14/2018 1135   APPEARANCEUR Clear 04/19/2018 1603   LABSPEC 1.033 (H) 07/14/2018 1135   PHURINE 7.0 07/14/2018 1135   GLUCOSEU NEGATIVE 07/14/2018 1135   HGBUR NEGATIVE 07/14/2018 1135   BILIRUBINUR NEGATIVE 07/14/2018 1135   BILIRUBINUR Negative 04/19/2018 1603   KETONESUR NEGATIVE 07/14/2018 1135   PROTEINUR NEGATIVE 07/14/2018 1135   UROBILINOGEN negative 01/30/2015 1136   NITRITE NEGATIVE 07/14/2018 1135   LEUKOCYTESUR NEGATIVE 07/14/2018 1135   Sepsis Labs: No results found for this or any previous visit (from the past 240 hour(s)).   Radiological Exams on Admission: Ct Angio Head W Or Wo Contrast  Result Date: 07/14/2018 CLINICAL DATA:  Stroke, follow-up. Code stroke. Speech disturbance. Confusion. Symptoms. EXAM: CT ANGIOGRAPHY HEAD AND NECK TECHNIQUE: Multidetector CT imaging of the head and neck was performed using the standard protocol during bolus administration of intravenous contrast. Multiplanar CT image reconstructions and MIPs were obtained to evaluate the vascular anatomy. Carotid stenosis measurements (when applicable) are obtained utilizing NASCET criteria, using the distal internal carotid diameter as the denominator. CONTRAST:  29mL OMNIPAQUE IOHEXOL 350 MG/ML SOLN COMPARISON:  CT head without contrast 07/14/2018. FINDINGS: CTA NECK FINDINGS Aortic arch: A 3 vessel arch configuration is present. No significant atherosclerotic changes or stenosis is present. Right carotid system: The right common carotid artery is within normal limits. The bifurcation demonstrates minimal scratched at bifurcation is unremarkable. Minimal atherosclerotic changes are noted in the carotid bulb. Cervical right ICA is otherwise normal. Left carotid system: The left common carotid artery is within normal limits. Bifurcation is unremarkable. Minimal atherosclerotic changes are noted within the carotid bulb. The cervical left ICA is otherwise normal.  Vertebral arteries: Vertebral arteries are relatively small bilaterally. Both vertebral arteries originate from the subclavian arteries without a significant stenosis. There is no focal stenosis or vascular injury to either artery in the neck. Skeleton: Vertebral body heights and AP alignment are anatomic. There straightening and slight reversal of the normal cervical lordosis. No focal lytic or blastic lesions are present. Other neck: No focal mucosal or submucosal lesions are present. Thyroid is within normal limits. Salivary glands are unremarkable. There is no significant cervical adenopathy. Upper chest: The lung apices are clear. There is no focal nodule, mass, or  airspace disease. Review of the MIP images confirms the above findings CTA HEAD FINDINGS Anterior circulation: The internal carotid arteries are within normal limits from the skull base through the ICA termini bilaterally. There is mild narrowing of the distal left M1 segment. Anterior communicating artery is patent. ACA and MCA branch vessels are normal. Posterior circulation: PICA origins are normal bilaterally. Basilar artery is normal. Right posterior cerebral artery is of fetal type. Small right P1 segment is noted. PCA branch vessels are normal. Venous sinuses: Dural sinuses are patent. Straight sinus and deep cerebral veins are intact. Cortical veins are normal. Anatomic variants: Fetal type right posterior cerebral artery. Review of the MIP images confirms the above findings IMPRESSION: 1. Minimal atherosclerotic changes in the cavernous internal carotid arteries bilaterally without a significant stenosis. Otherwise normal CT of the neck. 2. Minimal irregularity of the distal left M1 segment without a significant stenosis. 3. CTA circle-of-Willis otherwise within normal limits. Electronically Signed   By: San Morelle M.D.   On: 07/14/2018 10:38   Ct Angio Neck W Or Wo Contrast  Result Date: 07/14/2018 CLINICAL DATA:  Stroke,  follow-up. Code stroke. Speech disturbance. Confusion. Symptoms. EXAM: CT ANGIOGRAPHY HEAD AND NECK TECHNIQUE: Multidetector CT imaging of the head and neck was performed using the standard protocol during bolus administration of intravenous contrast. Multiplanar CT image reconstructions and MIPs were obtained to evaluate the vascular anatomy. Carotid stenosis measurements (when applicable) are obtained utilizing NASCET criteria, using the distal internal carotid diameter as the denominator. CONTRAST:  20mL OMNIPAQUE IOHEXOL 350 MG/ML SOLN COMPARISON:  CT head without contrast 07/14/2018. FINDINGS: CTA NECK FINDINGS Aortic arch: A 3 vessel arch configuration is present. No significant atherosclerotic changes or stenosis is present. Right carotid system: The right common carotid artery is within normal limits. The bifurcation demonstrates minimal scratched at bifurcation is unremarkable. Minimal atherosclerotic changes are noted in the carotid bulb. Cervical right ICA is otherwise normal. Left carotid system: The left common carotid artery is within normal limits. Bifurcation is unremarkable. Minimal atherosclerotic changes are noted within the carotid bulb. The cervical left ICA is otherwise normal. Vertebral arteries: Vertebral arteries are relatively small bilaterally. Both vertebral arteries originate from the subclavian arteries without a significant stenosis. There is no focal stenosis or vascular injury to either artery in the neck. Skeleton: Vertebral body heights and AP alignment are anatomic. There straightening and slight reversal of the normal cervical lordosis. No focal lytic or blastic lesions are present. Other neck: No focal mucosal or submucosal lesions are present. Thyroid is within normal limits. Salivary glands are unremarkable. There is no significant cervical adenopathy. Upper chest: The lung apices are clear. There is no focal nodule, mass, or airspace disease. Review of the MIP images confirms  the above findings CTA HEAD FINDINGS Anterior circulation: The internal carotid arteries are within normal limits from the skull base through the ICA termini bilaterally. There is mild narrowing of the distal left M1 segment. Anterior communicating artery is patent. ACA and MCA branch vessels are normal. Posterior circulation: PICA origins are normal bilaterally. Basilar artery is normal. Right posterior cerebral artery is of fetal type. Small right P1 segment is noted. PCA branch vessels are normal. Venous sinuses: Dural sinuses are patent. Straight sinus and deep cerebral veins are intact. Cortical veins are normal. Anatomic variants: Fetal type right posterior cerebral artery. Review of the MIP images confirms the above findings IMPRESSION: 1. Minimal atherosclerotic changes in the cavernous internal carotid arteries bilaterally without a significant stenosis.  Otherwise normal CT of the neck. 2. Minimal irregularity of the distal left M1 segment without a significant stenosis. 3. CTA circle-of-Willis otherwise within normal limits. Electronically Signed   By: San Morelle M.D.   On: 07/14/2018 10:38   Ct Head Code Stroke Wo Contrast  Result Date: 07/14/2018 CLINICAL DATA:  Code stroke. Speech disturbance. Confusion. Symptoms began 0730 hours EXAM: CT HEAD WITHOUT CONTRAST TECHNIQUE: Contiguous axial images were obtained from the base of the skull through the vertex without intravenous contrast. COMPARISON:  None. FINDINGS: Brain: There chronic appearing small vessel ischemic changes affecting the cerebral hemispheric white matter. There is an old focal infarction in the left basal ganglia. No sign of acute infarction, mass lesion, hemorrhage, hydrocephalus or extra-axial collection. Vascular: No abnormal vascular finding. Skull: Negative Sinuses/Orbits: Clear/normal Other: None ASPECTS ([place] Stroke Program Early CT Score) - Ganglionic level infarction (caudate, lentiform nuclei, internal capsule,  insula, M1-M3 cortex): 7 - Supraganglionic infarction (M4-M6 cortex): 3 Total score (0-10 with 10 being normal): 10 IMPRESSION: 1. No acute finding. Chronic small-vessel ischemic changes throughout the cerebral hemispheric white matter. 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 2. ASPECTS is 10. 3. These results were communicated to Dr. Leonel Ramsay at 10:09 amon 6/12/2020by text page via the [facility] messaging system. Electronically Signed   By: Nelson Chimes M.D.   On: 07/14/2018 10:10    EKG: Independently reviewed.  Sinus rhythm at 74 bpm with RBBB and QTc 572   Assessment/Plan Expressive aphasia, suspected TIA vs. stroke: Patient presents with decreased ability to speak, visual changes, balance disturbance, and possibly apraxia.  Initial CT scan of the brain and CT angiogram of the head and neck did not show any signs of acute stroke or significant stenosis.  Neurology consulted and recommended completion of work-up.  Suspect symptoms related to TIA versus stroke, but given patient's elevated blood pressure question PRES. - Admit to a telemetry bed - Admit to telemetry bed - Stroke order set initiated - Neuro checks - Add on TSH - Check  MRI brain without contrast - PT/OT/Speech to eval and treat - Check echocardiogram - Check Hemoglobin A1c and lipid panel in a.m. - ASA and Plavix per neurology - Appreciate neurology consultative services, will follow-up for further recommendation  Hypertensive emergency: On admission blood pressure elevated to 227/115. - Continue pharmacy substitution of irbesartan for Diovan - Hydralazine IV as needed  Prolonged QT interval: Acute.  QTc noted to be 572 on admission.  - Correcting any electrolyte abnormalities - Recheck EKG in a.m.  Diabetes mellitus type 2: Patient on oral medications of metformin 1000 mg twice daily and Januvia 100 mg daily.  On admission blood glucose was mildly elevated at 147.  Last noted hemoglobin A1c was 7.2 in 2016. - Hypoglycemic  protocol -Follow-up hemoglobin A1c in a.m. - Hold metformin and Januvia - CBGs q. before meals and at bedtime with sensitive SSI  Hyperlipidemia: Last lipid panel noted total cholesterol 221, HDL 45, LDL 131, and triglycerides 223 on 04/19/2018.  Patient had been on Crestor 5 mg daily. -Follow-up repeat lipid panel in a.m. -Atorvastatin 80 mg daily  DVT prophylaxis:Lovenox Code Status: Full Family Communication:  Disposition Plan: Likely discharge home in a.m. after completion of work-up Consults called: Neurology Admission status: Observation  Norval Morton MD [facility] Pager [phone]   If 7PM-7AM, please contact night-coverage [email or web address] Password [facility]  07/14/2018, 12:06 PM

## 2018-07-14 NOTE — ED Triage Notes (Signed)
Stroke screen negative for [facility]

## 2018-07-14 NOTE — ED Notes (Signed)
Pt daughter [place] [phone]

## 2018-07-14 NOTE — ED Notes (Signed)
ED TO INPATIENT HANDOFF REPORT  ED Nurse Name and Phone #: Thurmond Butts Levan Name/Age/Gender Darrell [name] 63 y.o. male Room/Bed: 033C/033C  Code Status   Code Status: Full Code  Home/SNF/Other Home Patient oriented to: self, place, time and situation Is this baseline? yes  Triage Complete: Triage complete  Chief Complaint Difficulty w/thoughts  Triage Note Pt arrives via [facility], awoke normal about 0630, about 0730 started having "difficulty with thoughts" (forming sentences and memory)  BP: 192/102-205/120 all other signs WDL  Stroke screen negative for [facility]   Allergies No Known Allergies  Level of Care/Admitting Diagnosis ED Disposition    ED Disposition Condition [place]: [place] [[id number]]  Level of Care: Telemetry Medical [104]  I expect the patient will be discharged within 24 hours: No (not a candidate for 5C-Observation unit)  Covid Evaluation: Screening Protocol (No Symptoms)  Diagnosis: TIA (transient ischemic attack) [539767]  Admitting Physician: Norval Morton [[id number]]  Attending Physician: Norval Morton [[id number]]  PT Class (Do Not Modify): Observation [104]  PT Acc Code (Do Not Modify): Observation [10022]       B Medical/Surgery History Past Medical History:  Diagnosis Date  . Dyslipidemia   . Essential hypertension, benign   . Hyperlipidemia   . Thyroid cyst   . Type 2 diabetes mellitus ([place])   . Vitamin D deficiency    Past Surgical History:  Procedure Laterality Date  . MELANOMA EXCISION     Back  . WISDOM TOOTH EXTRACTION       A IV Location/Drains/Wounds Patient Lines/Drains/Airways Status   Active Line/Drains/Airways    None          Intake/Output Last 24 hours No intake or output data in the 24 hours ending 07/14/18 1300  Labs/Imaging Results for orders placed or performed during the hospital encounter of 07/14/18 (from the past 48 hour(s))  Ethanol     Status: None    Collection Time: 07/14/18 10:09 AM  Result Value Ref Range   Alcohol, Ethyl (B) <10 <10 mg/dL    Comment: (NOTE) Lowest detectable limit for serum alcohol is 10 mg/dL. For medical purposes only. Performed at [facility], [place] [address], [place], [place] [address]   Comprehensive metabolic panel     Status: Abnormal   Collection Time: 07/14/18 10:09 AM  Result Value Ref Range   Sodium 139 135 - 145 mmol/L   Potassium 4.1 3.5 - 5.1 mmol/L    Comment: SLIGHT HEMOLYSIS   Chloride 104 98 - 111 mmol/L   CO2 25 22 - 32 mmol/L   Glucose, Bld 147 (H) 70 - 99 mg/dL   BUN 11 6 - 20 mg/dL   Creatinine, Ser 0.81 0.61 - 1.24 mg/dL   Calcium 9.1 8.9 - 10.3 mg/dL   Total Protein 6.4 (L) 6.5 - 8.1 g/dL   Albumin 4.1 3.5 - 5.0 g/dL   AST 37 15 - 41 U/L   ALT 16 0 - 44 U/L   Alkaline Phosphatase 29 (L) 38 - 126 U/L   Total Bilirubin 1.8 (H) 0.3 - 1.2 mg/dL   GFR calc non Af Amer >60 >60 mL/min   GFR calc Af Amer >60 >60 mL/min   Anion gap 10 5 - 15    Comment: Performed at [facility], [place] [address], [place], [place] [address]  CBC with Differential/Platelet     Status: Abnormal   Collection Time: 07/14/18 10:30  AM  Result Value Ref Range   WBC 7.8 4.0 - 10.5 K/uL   RBC 4.23 4.22 - 5.81 MIL/uL   Hemoglobin 13.6 13.0 - 17.0 g/dL   HCT 38.9 (L) 39.0 - 52.0 %   MCV 92.0 80.0 - 100.0 fL   MCH 32.2 26.0 - 34.0 pg   MCHC 35.0 30.0 - 36.0 g/dL   RDW 11.9 11.5 - 15.5 %   Platelets 198 150 - 400 K/uL   nRBC 0.0 0.0 - 0.2 %   Neutrophils Relative % 76 %   Neutro Abs 5.9 1.7 - 7.7 K/uL   Lymphocytes Relative 16 %   Lymphs Abs 1.3 0.7 - 4.0 K/uL   Monocytes Relative 6 %   Monocytes Absolute 0.5 0.1 - 1.0 K/uL   Eosinophils Relative 1 %   Eosinophils Absolute 0.1 0.0 - 0.5 K/uL   Basophils Relative 1 %   Basophils Absolute 0.0 0.0 - 0.1 K/uL   Immature Granulocytes 0 %   Abs Immature Granulocytes 0.03 0.00 - 0.07 K/uL    Comment: Performed at [facility]  [address] [place], [place] [address]  I-stat chem 8, ED     Status: Abnormal   Collection Time: 07/14/18 10:40 AM  Result Value Ref Range   Sodium 140 135 - 145 mmol/L   Potassium 3.4 (L) 3.5 - 5.1 mmol/L   Chloride 101 98 - 111 mmol/L   BUN 10 6 - 20 mg/dL   Creatinine, Ser 0.80 0.61 - 1.24 mg/dL   Glucose, Bld 126 (H) 70 - 99 mg/dL   Calcium, Ion 1.19 1.15 - 1.40 mmol/L   TCO2 27 22 - 32 mmol/L   Hemoglobin 12.9 (L) 13.0 - 17.0 g/dL   HCT 38.0 (L) 39.0 - 52.0 %  Protime-INR     Status: None   Collection Time: 07/14/18 10:45 AM  Result Value Ref Range   Prothrombin Time 14.0 11.4 - 15.2 seconds   INR 1.1 0.8 - 1.2    Comment: (NOTE) INR goal varies based on device and disease states. Performed at [facility], [place] [address], [place], [place] [address]   APTT     Status: None   Collection Time: 07/14/18 10:45 AM  Result Value Ref Range   aPTT 24 24 - 36 seconds    Comment: Performed at [facility] [address] [place], [place] [address]  Urine rapid drug screen (hosp performed)     Status: None   Collection Time: 07/14/18 11:35 AM  Result Value Ref Range   Opiates NONE DETECTED NONE DETECTED   Cocaine NONE DETECTED NONE DETECTED   Benzodiazepines NONE DETECTED NONE DETECTED   Amphetamines NONE DETECTED NONE DETECTED   Tetrahydrocannabinol NONE DETECTED NONE DETECTED   Barbiturates NONE DETECTED NONE DETECTED    Comment: (NOTE) DRUG SCREEN FOR MEDICAL PURPOSES ONLY.  IF CONFIRMATION IS NEEDED FOR ANY PURPOSE, NOTIFY LAB WITHIN 5 DAYS. LOWEST DETECTABLE LIMITS FOR URINE DRUG SCREEN Drug Class                     Cutoff (ng/mL) Amphetamine and metabolites    1000 Barbiturate and metabolites    200 Benzodiazepine                 242 Tricyclics and metabolites     300 Opiates and metabolites        300 Cocaine and metabolites        300  THC                            50 Performed at [facility], [place] [address] [place], [place] [address]    Urinalysis, Routine w reflex microscopic     Status: Abnormal   Collection Time: 07/14/18 11:35 AM  Result Value Ref Range   Color, Urine YELLOW YELLOW   APPearance CLEAR CLEAR   Specific Gravity, Urine 1.033 (H) 1.005 - 1.030   pH 7.0 5.0 - 8.0   Glucose, UA NEGATIVE NEGATIVE mg/dL   Hgb urine dipstick NEGATIVE NEGATIVE   Bilirubin Urine NEGATIVE NEGATIVE   Ketones, ur NEGATIVE NEGATIVE mg/dL   Protein, ur NEGATIVE NEGATIVE mg/dL   Nitrite NEGATIVE NEGATIVE   Leukocytes,Ua NEGATIVE NEGATIVE    Comment: Performed at [facility] [address], [place], [place] [address]   Ct Angio Head W Or Wo Contrast  Result Date: 07/14/2018 CLINICAL DATA:  Stroke, follow-up. Code stroke. Speech disturbance. Confusion. Symptoms. EXAM: CT ANGIOGRAPHY HEAD AND NECK TECHNIQUE: Multidetector CT imaging of the head and neck was performed using the standard protocol during bolus administration of intravenous contrast. Multiplanar CT image reconstructions and MIPs were obtained to evaluate the vascular anatomy. Carotid stenosis measurements (when applicable) are obtained utilizing NASCET criteria, using the distal internal carotid diameter as the denominator. CONTRAST:  47mL OMNIPAQUE IOHEXOL 350 MG/ML SOLN COMPARISON:  CT head without contrast 07/14/2018. FINDINGS: CTA NECK FINDINGS Aortic arch: A 3 vessel arch configuration is present. No significant atherosclerotic changes or stenosis is present. Right carotid system: The right common carotid artery is within normal limits. The bifurcation demonstrates minimal scratched at bifurcation is unremarkable. Minimal atherosclerotic changes are noted in the carotid bulb. Cervical right ICA is otherwise normal. Left carotid system: The left common carotid artery is within normal limits. Bifurcation is unremarkable. Minimal atherosclerotic changes are noted within the carotid bulb. The cervical left ICA is otherwise normal. Vertebral arteries: Vertebral arteries are  relatively small bilaterally. Both vertebral arteries originate from the subclavian arteries without a significant stenosis. There is no focal stenosis or vascular injury to either artery in the neck. Skeleton: Vertebral body heights and AP alignment are anatomic. There straightening and slight reversal of the normal cervical lordosis. No focal lytic or blastic lesions are present. Other neck: No focal mucosal or submucosal lesions are present. Thyroid is within normal limits. Salivary glands are unremarkable. There is no significant cervical adenopathy. Upper chest: The lung apices are clear. There is no focal nodule, mass, or airspace disease. Review of the MIP images confirms the above findings CTA HEAD FINDINGS Anterior circulation: The internal carotid arteries are within normal limits from the skull base through the ICA termini bilaterally. There is mild narrowing of the distal left M1 segment. Anterior communicating artery is patent. ACA and MCA branch vessels are normal. Posterior circulation: PICA origins are normal bilaterally. Basilar artery is normal. Right posterior cerebral artery is of fetal type. Small right P1 segment is noted. PCA branch vessels are normal. Venous sinuses: Dural sinuses are patent. Straight sinus and deep cerebral veins are intact. Cortical veins are normal. Anatomic variants: Fetal type right posterior cerebral artery. Review of the MIP images confirms the above findings IMPRESSION: 1. Minimal atherosclerotic changes in the cavernous internal carotid arteries bilaterally without a significant stenosis. Otherwise normal CT of the neck. 2. Minimal irregularity of the distal left M1 segment without a significant stenosis. 3.  CTA circle-of-Willis otherwise within normal limits. Electronically Signed   By: San Morelle M.D.   On: 07/14/2018 10:38   Ct Angio Neck W Or Wo Contrast  Result Date: 07/14/2018 CLINICAL DATA:  Stroke, follow-up. Code stroke. Speech disturbance.  Confusion. Symptoms. EXAM: CT ANGIOGRAPHY HEAD AND NECK TECHNIQUE: Multidetector CT imaging of the head and neck was performed using the standard protocol during bolus administration of intravenous contrast. Multiplanar CT image reconstructions and MIPs were obtained to evaluate the vascular anatomy. Carotid stenosis measurements (when applicable) are obtained utilizing NASCET criteria, using the distal internal carotid diameter as the denominator. CONTRAST:  63mL OMNIPAQUE IOHEXOL 350 MG/ML SOLN COMPARISON:  CT head without contrast 07/14/2018. FINDINGS: CTA NECK FINDINGS Aortic arch: A 3 vessel arch configuration is present. No significant atherosclerotic changes or stenosis is present. Right carotid system: The right common carotid artery is within normal limits. The bifurcation demonstrates minimal scratched at bifurcation is unremarkable. Minimal atherosclerotic changes are noted in the carotid bulb. Cervical right ICA is otherwise normal. Left carotid system: The left common carotid artery is within normal limits. Bifurcation is unremarkable. Minimal atherosclerotic changes are noted within the carotid bulb. The cervical left ICA is otherwise normal. Vertebral arteries: Vertebral arteries are relatively small bilaterally. Both vertebral arteries originate from the subclavian arteries without a significant stenosis. There is no focal stenosis or vascular injury to either artery in the neck. Skeleton: Vertebral body heights and AP alignment are anatomic. There straightening and slight reversal of the normal cervical lordosis. No focal lytic or blastic lesions are present. Other neck: No focal mucosal or submucosal lesions are present. Thyroid is within normal limits. Salivary glands are unremarkable. There is no significant cervical adenopathy. Upper chest: The lung apices are clear. There is no focal nodule, mass, or airspace disease. Review of the MIP images confirms the above findings CTA HEAD FINDINGS  Anterior circulation: The internal carotid arteries are within normal limits from the skull base through the ICA termini bilaterally. There is mild narrowing of the distal left M1 segment. Anterior communicating artery is patent. ACA and MCA branch vessels are normal. Posterior circulation: PICA origins are normal bilaterally. Basilar artery is normal. Right posterior cerebral artery is of fetal type. Small right P1 segment is noted. PCA branch vessels are normal. Venous sinuses: Dural sinuses are patent. Straight sinus and deep cerebral veins are intact. Cortical veins are normal. Anatomic variants: Fetal type right posterior cerebral artery. Review of the MIP images confirms the above findings IMPRESSION: 1. Minimal atherosclerotic changes in the cavernous internal carotid arteries bilaterally without a significant stenosis. Otherwise normal CT of the neck. 2. Minimal irregularity of the distal left M1 segment without a significant stenosis. 3. CTA circle-of-Willis otherwise within normal limits. Electronically Signed   By: San Morelle M.D.   On: 07/14/2018 10:38   Ct Head Code Stroke Wo Contrast  Result Date: 07/14/2018 CLINICAL DATA:  Code stroke. Speech disturbance. Confusion. Symptoms began 0730 hours EXAM: CT HEAD WITHOUT CONTRAST TECHNIQUE: Contiguous axial images were obtained from the base of the skull through the vertex without intravenous contrast. COMPARISON:  None. FINDINGS: Brain: There chronic appearing small vessel ischemic changes affecting the cerebral hemispheric white matter. There is an old focal infarction in the left basal ganglia. No sign of acute infarction, mass lesion, hemorrhage, hydrocephalus or extra-axial collection. Vascular: No abnormal vascular finding. Skull: Negative Sinuses/Orbits: Clear/normal Other: None ASPECTS ([place] Stroke Program Early CT Score) - Ganglionic level infarction (caudate, lentiform nuclei, internal capsule, insula,  M1-M3 cortex): 7 -  Supraganglionic infarction (M4-M6 cortex): 3 Total score (0-10 with 10 being normal): 10 IMPRESSION: 1. No acute finding. Chronic small-vessel ischemic changes throughout the cerebral hemispheric white matter. 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 2. ASPECTS is 10. 3. These results were communicated to Dr. Leonel Ramsay at 10:09 amon 6/12/2020by text page via the [facility] messaging system. Electronically Signed   By: Nelson Chimes M.D.   On: 07/14/2018 10:10    Pending Labs Unresulted Labs (From admission, onward)    Start     Ordered   07/15/18 0500  Hemoglobin A1c  Tomorrow morning,   R     07/14/18 1255   07/15/18 0500  Lipid panel  Tomorrow morning,   R    Comments: Fasting    07/14/18 1255   07/15/18 0500  CBC  Tomorrow morning,   R     07/14/18 1255   07/15/18 5462  Basic metabolic panel  Tomorrow morning,   R     07/14/18 1255   07/15/18 0500  Magnesium  Tomorrow morning,   R     07/14/18 1259   07/14/18 1257  TSH  Add-on,   AD     07/14/18 1256   07/14/18 1253  HIV antibody (Routine Testing)  Once,   STAT     07/14/18 1254   07/14/18 1129  Novel Coronavirus,NAA,(SEND-OUT TO REF LAB - TAT 24-48 hrs); Hosp Order  (Asymptomatic Patients Labs)  Once,   STAT    Question:  Rule Out  Answer:  Yes   07/14/18 1128          Vitals/Pain Today's Vitals   07/14/18 1100 07/14/18 1215 07/14/18 1230 07/14/18 1245  BP: (!) 187/86 (!) 214/115 (!) 187/119 (!) 169/139  Pulse: 71 66 76 67  Resp: 16 15 13 12   Temp:      TempSrc:      SpO2: 100% 98% 98% 98%  Weight:      Height:      PainSc:        Isolation Precautions No active isolations  Medications Medications  clopidogrel (PLAVIX) tablet 75 mg (has no administration in time range)  aspirin EC tablet 81 mg (has no administration in time range)  irbesartan (AVAPRO) tablet 150 mg (has no administration in time range)   stroke: mapping our early stages of recovery book (has no administration in time range)  acetaminophen (TYLENOL)  tablet 650 mg (has no administration in time range)    Or  acetaminophen (TYLENOL) solution 650 mg (has no administration in time range)    Or  acetaminophen (TYLENOL) suppository 650 mg (has no administration in time range)  senna-docusate (Senokot-S) tablet 1 tablet (has no administration in time range)  enoxaparin (LOVENOX) injection 40 mg (has no administration in time range)  hydrALAZINE (APRESOLINE) injection 10 mg (has no administration in time range)  insulin aspart (novoLOG) injection 0-9 Units (has no administration in time range)  insulin aspart (novoLOG) injection 0-5 Units (has no administration in time range)  magnesium sulfate IVPB 1 g 100 mL (has no administration in time range)  iohexol (OMNIPAQUE) 350 MG/ML injection 75 mL (75 mLs Intravenous Contrast Given 07/14/18 1011)  clopidogrel (PLAVIX) tablet 300 mg (300 mg Oral Given 07/14/18 1132)  acetaminophen (TYLENOL) tablet 325 mg (325 mg Oral Given 07/14/18 1222)  ondansetron (ZOFRAN-ODT) disintegrating tablet 4 mg (4 mg Oral Given 07/14/18 1222)    Mobility walks Low fall risk   Focused Assessments  R Recommendations: See Admitting Provider Note  Report given to:   Additional Notes:

## 2018-07-14 NOTE — Consult Note (Signed)
Neurology Consultation Reason for Consult: Aphasia Referring Physician: Sabra Heck, B  CC: Aphasia  History is obtained from: Patient  HPI: Darrell [name] is a 63 y.o. male with a history of hypertension, hyperlipidemia, diabetes who presents with aphasia that started abruptly this morning at 7:30 AM.  He states that he initially had no trouble speaking when he woke up, and then began having more more difficulty finding his words.  He also reports some visual change at the time number the emergency department where code stroke was activated.  At this time, his symptoms are improving but still mild.  He was taken emergently for CT by the time the CT had been obtained, his symptoms had completely resolved.   LKW: 7:30 AM tpa given?: no, resolution of symptoms    ROS: A 14 point ROS was performed and is negative except as noted in the HPI.  Past Medical History:  Diagnosis Date  . Dyslipidemia   . Essential hypertension, benign   . Hyperlipidemia   . Thyroid cyst   . Type 2 diabetes mellitus ([place])   . Vitamin D deficiency      Family History  Problem Relation Age of Onset  . Pulmonary embolism Mother   . Diabetes Father   . Hypertension Father   . Alzheimer's disease Father   . Diabetes Maternal Grandmother   . Colon cancer Neg Hx      Social History:  reports that he has quit smoking. He has never used smokeless tobacco. He reports current alcohol use. He reports that he does not use drugs.   Exam: Current vital signs: BP (!) 227/115   Pulse 67   Temp 98.6 F (37 C) (Oral)   Resp 17   Ht 5\' 5"  (1.651 m)   Wt 67.1 kg   SpO2 99%   BMI 24.63 kg/m  Vital signs in last 24 hours: Temp:  [98.6 F (37 C)] 98.6 F (37 C) (06/12 0954) Pulse Rate:  [67-68] 67 (06/12 1030) Resp:  [14-17] 17 (06/12 1030) BP: (197-227)/(109-115) 227/115 (06/12 1030) SpO2:  [99 %] 99 % (06/12 1030) Weight:  [67.1 kg] 67.1 kg (06/12 0942)   Physical Exam  Constitutional: Appears  well-developed and well-nourished.  Psych: Affect appropriate to situation Eyes: No scleral injection HENT: No OP obstrucion Head: Normocephalic.  Cardiovascular: Normal rate and regular rhythm.  Respiratory: Effort normal, non-labored breathing GI: Soft.  No distension. There is no tenderness.  Skin: WDI  Neuro: Mental Status: Patient is awake, alert, oriented to person, place, month, year, and situation. Patient is able to give a clear and coherent history. No signs of aphasia or neglect Cranial Nerves: II: Visual Fields are full. Pupils are equal, round, and reactive to light.   III,IV, VI: EOMI without ptosis or diploplia.  V: Facial sensation is symmetric to temperature VII: Facial movement is symmetric.  VIII: hearing is intact to voice X: Uvula elevates symmetrically XI: Shoulder shrug is symmetric. XII: tongue is midline without atrophy or fasciculations.  Motor: Tone is normal. Bulk is normal. 5/5 strength was present in all four extremities.  Sensory: Sensation is symmetric to light touch and temperature in the arms and legs. Cerebellar: FNF and HKS are intact bilaterally      I have reviewed labs in epic and the results pertinent to this consultation are: CMP-unremarkable  I have reviewed the images obtained: CT head-unremarkable  Impression: 63 year old male with transient aphasia most consistent with transient ischemic attack.  He will need to  be admitted for secondary risk factor modification.  Recommendations: - HgbA1c, fasting lipid panel - MRI  of the brain without contrast - Frequent neuro checks - Echocardiogram - Prophylactic therapy-Antiplatelet med: Aspirin -81 mg and Plavix 75 mg following 300 mg load - Risk factor modification - Telemetry monitoring - PT consult, OT consult, Speech consult - Stroke team to follow   Roland Rack, MD [facility] [phone]  If 7pm- 7am, please page neurology on call as listed in  [place].

## 2018-07-14 NOTE — Code Documentation (Addendum)
63yo male arriving to [facility] via [place] at [phone]. Patient from home where he developed sudden onset difficulty speaking at 0730. Patient also reports blurred vision that resolved. Patient with h/o DM, HTN and anxiety per ED RN. Code stroke was activated following arrival to the ED. Stroke team met patient in CT. CT completed followed by CTA head and neck. NIHSS 0, see documentation for details and code stroke times. Patient with resolved deficits at this time. No acute stroke treatment at this time. TIA alert. Handoff with ED RN Raquel Sarna.

## 2018-07-14 NOTE — ED Triage Notes (Signed)
Pt arrives via [facility], awoke normal about 0630, about 0730 started having "difficulty with thoughts" (forming sentences and memory)  BP: 192/102-205/120 all other signs WDL

## 2018-07-15 ENCOUNTER — Observation Stay (HOSPITAL_BASED_OUTPATIENT_CLINIC_OR_DEPARTMENT_OTHER): Payer: Managed Care, Other (non HMO)

## 2018-07-15 DIAGNOSIS — I4510 Unspecified right bundle-branch block: Secondary | ICD-10-CM

## 2018-07-15 DIAGNOSIS — E1169 Type 2 diabetes mellitus with other specified complication: Secondary | ICD-10-CM | POA: Diagnosis not present

## 2018-07-15 DIAGNOSIS — G459 Transient cerebral ischemic attack, unspecified: Secondary | ICD-10-CM

## 2018-07-15 DIAGNOSIS — R4701 Aphasia: Secondary | ICD-10-CM | POA: Diagnosis not present

## 2018-07-15 DIAGNOSIS — I161 Hypertensive emergency: Secondary | ICD-10-CM | POA: Diagnosis not present

## 2018-07-15 DIAGNOSIS — E785 Hyperlipidemia, unspecified: Secondary | ICD-10-CM | POA: Diagnosis not present

## 2018-07-15 LAB — BASIC METABOLIC PANEL
Anion gap: 10 (ref 5–15)
BUN: 9 mg/dL (ref 6–20)
CO2: 28 mmol/L (ref 22–32)
Calcium: 9.8 mg/dL (ref 8.9–10.3)
Chloride: 102 mmol/L (ref 98–111)
Creatinine, Ser: 0.77 mg/dL (ref 0.61–1.24)
GFR calc Af Amer: >60 mL/min (ref >60)
GFR calc non Af Amer: >60 mL/min (ref >60)
Glucose, Bld: 174 mg/dL — ABNORMAL HIGH (ref 70–99)
Potassium: 3.6 mmol/L (ref 3.5–5.1)
Sodium: 140 mmol/L (ref 135–145)

## 2018-07-15 LAB — NOVEL CORONAVIRUS, NAA (HOSP ORDER, SEND-OUT TO REF LAB; TAT 18-24 HRS): SARS-CoV-2, NAA: NOT DETECTED

## 2018-07-15 LAB — GLUCOSE, CAPILLARY
Glucose-Capillary: 123 mg/dL — ABNORMAL HIGH (ref 70–99)
Glucose-Capillary: 160 mg/dL — ABNORMAL HIGH (ref 70–99)

## 2018-07-15 LAB — LIPID PANEL
Cholesterol: 126 mg/dL (ref 0–200)
HDL: 50 mg/dL (ref >40)
LDL Cholesterol: 64 mg/dL (ref 0–99)
Total CHOL/HDL Ratio: 2.5 RATIO
Triglycerides: 58 mg/dL (ref <150)
VLDL: 12 mg/dL (ref 0–40)

## 2018-07-15 LAB — CBC
HCT: 38.3 % — ABNORMAL LOW (ref 39.0–52.0)
Hemoglobin: 14 g/dL (ref 13.0–17.0)
MCH: 33.1 pg (ref 26.0–34.0)
MCHC: 36.6 g/dL — ABNORMAL HIGH (ref 30.0–36.0)
MCV: 90.5 fL (ref 80.0–100.0)
Platelets: 200 10*3/uL (ref 150–400)
RBC: 4.23 MIL/uL (ref 4.22–5.81)
RDW: 11.8 % (ref 11.5–15.5)
WBC: 7.3 10*3/uL (ref 4.0–10.5)
nRBC: 0 % (ref 0.0–0.2)

## 2018-07-15 LAB — ECHOCARDIOGRAM COMPLETE
Height: 65 in
Weight: 2368 oz

## 2018-07-15 LAB — HIV ANTIBODY (ROUTINE TESTING W REFLEX): HIV Screen 4th Generation wRfx: NONREACTIVE

## 2018-07-15 LAB — HEMOGLOBIN A1C
Hgb A1c MFr Bld: 6.2 % — ABNORMAL HIGH (ref 4.8–5.6)
Mean Plasma Glucose: 131.24 mg/dL

## 2018-07-15 LAB — MAGNESIUM: Magnesium: 1.8 mg/dL (ref 1.7–2.4)

## 2018-07-15 MED ORDER — CLOPIDOGREL BISULFATE 75 MG PO TABS: 75.0000 mg | ORAL_TABLET | Freq: Every day | ORAL | 3 refills | Status: DC

## 2018-07-15 MED ORDER — AMLODIPINE BESYLATE 10 MG PO TABS: 10.0000 mg | ORAL_TABLET | Freq: Every day | ORAL | 0 refills | Status: DC

## 2018-07-15 MED ORDER — ASPIRIN EC 81 MG PO TBEC: 81.0000 mg | DELAYED_RELEASE_TABLET | Freq: Every day | ORAL | 0 refills | Status: AC

## 2018-07-15 MED ORDER — AMLODIPINE BESYLATE 10 MG PO TABS
10.0000 mg | ORAL_TABLET | Freq: Every day | ORAL | Status: DC
  Administered 2018-07-15: 15:00:00 10 mg via ORAL
  Filled 2018-07-15: qty 1

## 2018-07-15 MED ORDER — PRAVASTATIN SODIUM 20 MG PO TABS: 20.0000 mg | ORAL_TABLET | Freq: Every evening | ORAL | 0 refills | Status: DC

## 2018-07-15 NOTE — Progress Notes (Signed)
Patient discharged home. Education and instructions given to patient. IV removed by patient. CCMD notified. Patient leaving unit via wheelchair.

## 2018-07-15 NOTE — Discharge Summary (Signed)
Discharge Summary  Darrell [name] OIZ:[id number] DOB: [birth year]  PCP: Sharion Balloon, FNP  Admit date: 07/14/2018 Discharge date: 07/15/2018  Time spent: 72mins, more than 50% time spent on coordination of care. Case discussed with neurology  Recommendations for Outpatient Follow-up:  1. F/u with PCP within a week  for hospital discharge follow up, repeat cbc/bmp at follow up. pcp to monitor blood pressure control and blood glucose control 2. F/u with neurology for TIA 3. Cardiology office to arrange 30day event monitor to rule out afib  Discharge Diagnoses:  Active Hospital Problems   Diagnosis Date Noted   TIA (transient ischemic attack) 07/14/2018   Prolonged QT interval 07/14/2018   Expressive aphasia 07/14/2018   Hypertensive emergency 07/14/2018   Type 2 diabetes mellitus with hyperlipidemia ([place]) 01/30/2015    Resolved Hospital Problems  No resolved problems to display.    Discharge Condition: stable  Diet recommendation: heart healthy  Filed Weights   07/14/18 0942  Weight: 67.1 kg    History of present illness: (per admitting MD Dr Tamala Julian) PCP: Sharion Balloon, FNP  Patient coming from: Home via EMS  Chief Complaint: Difficulty speaking  I have personally briefly reviewed patient's old medical records in [place]   HPI: Darrell [name] is a 63 y.o. male with medical history significant of HTN, HLD, and DM type II; who presented with complaints of difficulty speaking.  Patient had been getting ready for work when he noted abrupt onset of symptoms around 7:45 AM.  Complained of feeling funny.  Associated symptoms included hazy bleeding, headache, difficulty writing, and felt off balance.  He did not note any focal weakness or facial droop.  Denied having any chest pain, palpitations, shortness of breath, cough, nausea, vomiting, or diarrhea symptoms.  Patient notes that his blood pressure has been elevated before but never had any problems because  of this.  He drinks alcohol only occasionally and does not report any significant history of tobacco use.  ED Course: Patient presented as a code stroke and was seen by neurology.  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  He was noted to be hypertensive with blood pressure elevated up to 227/115, and all other vitals within normal limits.  Lab work was relatively unremarkable.  CT angiogram of the head and neck reveal any significant signs of stenosis.  Patient was given 300 mg of Plavix and 325 mg of aspirin. [facility] called to admit.   Hospital Course:  Active Problems:   Type 2 diabetes mellitus with hyperlipidemia (H[facility])   TIA (transient ischemic attack)   Prolonged QT interval   Expressive aphasia   Hypertensive emergency    TIA -He presents with expressive aphasia , lasted for several hours, symptom has resolved -CT scan of the brain and CT angiogram of the head and neck did not show any signs of acute stroke or significant stenosis -MRI "Possible punctate acute infarction in the right side of the pons base axial diffusion imaging and ADC map. This is not confirmed on the coronal study however. Mild chronic small-vessel changes elsewhere affecting the pons. Moderate to severe chronic small-vessel ischemic changes of the cerebral hemispheric white matter. 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 -a1c 6.2 -LDL 64, patient reports joint pain on lipitor and crestor, plan to discharge on pravachol 20mg daily -tele sinus rhythm  -echo with severe left ventricular wall thickness, lvef wnl, no cardiac embolic source  mentioned  -Neurology consulted, input appreciated -Asa/plavix three weeks then plavix alone -F/u with neurology in 4weeks -30day event monitor to be arranged by cardiology office, message sent to cards master  HTN, Uncontrolled UDS negative  Initially allowed permissive hypertension H/o  noncompliance with bp meds, he said he takes one blood pressure meds at home Blood pressure needs to be optimized, he is discharged on norvasc 10mg  daily, diovan 160mg  daily He is advised to check blood pressure at home twice a day, f/u with pcp next week for further bp meds adjustment  noninsulin dependent DM2  a1c 6.4 Continue home meds metformin, sitagliptin  F/u with pcp  EKG : sinus bradycardia, chronic RBBB Echo no wall motion abnormality, does has severe left ventricular wall thickness, lvef wnl. Right ventrical unremarkable, Pulmonary hypertension is indeterminant. Denies chest pain, no sob F/u with pcp  Procedures:  none  Consultations:  neurology  Discharge Exam: BP (!) 196/113 (BP Location: Right Arm)    Pulse 71    Temp 97.7 F (36.5 C) (Oral)    Resp 15    Ht 5\' 5"  (1.651 m)    Wt 67.1 kg    SpO2 97%    BMI 24.63 kg/m   General: NAD Cardiovascular: RRR Respiratory: CTABL Neuro: no focal deficit   Discharge Instructions You were cared for by a hospitalist during your hospital stay. If you have any questions about your discharge medications or the care you received while you were in the hospital after you are discharged, you can call the unit and asked to speak with the hospitalist on call if the hospitalist that took care of you is not available. Once you are discharged, your primary care physician will handle any further medical issues. Please note that NO REFILLS for any discharge medications will be authorized once you are discharged, as it is imperative that you return to your primary care physician (or establish a relationship with a primary care physician if you do not have one) for your aftercare needs so that they can reassess your need for medications and monitor your lab values.  Discharge Instructions    Ambulatory referral to Neurology   Complete by: As directed    Follow up with stroke clinic NP (Jessica Vanschaick or Cecille Rubin, if both not  available, consider Zachery Dauer, or [facility]) at [facility] in about 4 weeks. Thanks.   Diet - low sodium heart healthy   Complete by: As directed    Carb modified diet   Increase activity slowly   Complete by: As directed      Allergies as of 07/15/2018   No Known Allergies     Medication List    STOP taking these medications   hydrochlorothiazide 25 MG tablet Commonly known as: HYDRODIURIL   rosuvastatin 5 MG tablet Commonly known as: Crestor   sildenafil 20 MG tablet Commonly known as: Revatio     TAKE these medications   amLODipine 10 MG tablet Commonly known as: NORVASC Take 1 tablet (10 mg total) by mouth daily. What changed:   medication strength  how much to take   aspirin EC 81 MG tablet Take 1 tablet (81 mg total) by mouth daily for 21 days.   clopidogrel 75 MG tablet Commonly known as: PLAVIX Take 1 tablet (75 mg total) by mouth daily. Start taking on: July 16, 2018   FreeStyle Libre 14 Day Sensor Misc 1 application by Does not apply route every 14 (fourteen) days.   FreeStyle [place] Reader  Devi 1 applicator by Does not apply route 2 (two) times daily as needed.   Icosapent Ethyl 1 g Caps Take 2 capsules (2 g total) by mouth 2 (two) times daily.   metFORMIN 500 MG 24 hr tablet Commonly known as: Glucophage XR Take 2 tabs in the AM and 1 tab PM What changed:   how much to take  how to take this  when to take this  additional instructions   pravastatin 20 MG tablet Commonly known as: Pravachol Take 1 tablet (20 mg total) by mouth every evening.   sitaGLIPtin 100 MG tablet Commonly known as: Januvia Take 1 tablet (100 mg total) by mouth daily.   valsartan 160 MG tablet Commonly known as: Diovan Take 1 tablet (160 mg total) by mouth daily.   Vitamin D (Ergocalciferol) 1.25 MG (50000 UT) Caps capsule Commonly known as: DRISDOL Take 1 capsule (50,000 Units total) by mouth every 7 (seven) days. What changed: additional instructions       No Known Allergies Follow-up Information    Sharion Balloon, FNP Follow up in 1 week(s).   Specialty: Family Medicine Why: hospital discharge follow up. please check your blood pressure at home twice a day, bring in record for your doctor to review for blood pressure meds adjustment. Contact information: [place] [address] [phone]        [facility] Follow up in 1 month(s).   Why: TIA follow up Contact information: [address]     [address] [place] [address] [place] [facility] Follow up.   Specialty: Cardiology Why: please call cardiology office if you donot hear from then in three business days cardiology office to arrange 30day heart monitor  Contact information: [address] [place] [phone]           The results of significant diagnostics from this hospitalization (including imaging, microbiology, ancillary and laboratory) are listed below for reference.    Significant Diagnostic Studies: Ct Angio Head W Or Wo Contrast  Result Date: 07/14/2018 CLINICAL DATA:  Stroke, follow-up. Code stroke. Speech disturbance. Confusion. Symptoms. EXAM: CT ANGIOGRAPHY HEAD AND NECK TECHNIQUE: Multidetector CT imaging of the head and neck was performed using the standard protocol during bolus administration of intravenous contrast. Multiplanar CT image reconstructions and MIPs were obtained to evaluate the vascular anatomy. Carotid stenosis measurements (when applicable) are obtained utilizing NASCET criteria, using the distal internal carotid diameter as the denominator. CONTRAST:  88mL OMNIPAQUE IOHEXOL 350 MG/ML SOLN COMPARISON:  CT head without contrast 07/14/2018. FINDINGS: CTA NECK FINDINGS Aortic arch: A 3 vessel arch configuration is present. No significant atherosclerotic changes or stenosis is present. Right carotid system: The  right common carotid artery is within normal limits. The bifurcation demonstrates minimal scratched at bifurcation is unremarkable. Minimal atherosclerotic changes are noted in the carotid bulb. Cervical right ICA is otherwise normal. Left carotid system: The left common carotid artery is within normal limits. Bifurcation is unremarkable. Minimal atherosclerotic changes are noted within the carotid bulb. The cervical left ICA is otherwise normal. Vertebral arteries: Vertebral arteries are relatively small bilaterally. Both vertebral arteries originate from the subclavian arteries without a significant stenosis. There is no focal stenosis or vascular injury to either artery in the neck. Skeleton: Vertebral body heights and AP alignment are anatomic. There straightening and slight reversal of the normal cervical lordosis. No focal lytic or blastic  lesions are present. Other neck: No focal mucosal or submucosal lesions are present. Thyroid is within normal limits. Salivary glands are unremarkable. There is no significant cervical adenopathy. Upper chest: The lung apices are clear. There is no focal nodule, mass, or airspace disease. Review of the MIP images confirms the above findings CTA HEAD FINDINGS Anterior circulation: The internal carotid arteries are within normal limits from the skull base through the ICA termini bilaterally. There is mild narrowing of the distal left M1 segment. Anterior communicating artery is patent. ACA and MCA branch vessels are normal. Posterior circulation: PICA origins are normal bilaterally. Basilar artery is normal. Right posterior cerebral artery is of fetal type. Small right P1 segment is noted. PCA branch vessels are normal. Venous sinuses: Dural sinuses are patent. Straight sinus and deep cerebral veins are intact. Cortical veins are normal. Anatomic variants: Fetal type right posterior cerebral artery. Review of the MIP images confirms the above findings IMPRESSION: 1. Minimal  atherosclerotic changes in the cavernous internal carotid arteries bilaterally without a significant stenosis. Otherwise normal CT of the neck. 2. Minimal irregularity of the distal left M1 segment without a significant stenosis. 3. CTA circle-of-Willis otherwise within normal limits. Electronically Signed   By: San Morelle M.D.   On: 07/14/2018 10:38   Ct Angio Neck W Or Wo Contrast  Result Date: 07/14/2018 CLINICAL DATA:  Stroke, follow-up. Code stroke. Speech disturbance. Confusion. Symptoms. EXAM: CT ANGIOGRAPHY HEAD AND NECK TECHNIQUE: Multidetector CT imaging of the head and neck was performed using the standard protocol during bolus administration of intravenous contrast. Multiplanar CT image reconstructions and MIPs were obtained to evaluate the vascular anatomy. Carotid stenosis measurements (when applicable) are obtained utilizing NASCET criteria, using the distal internal carotid diameter as the denominator. CONTRAST:  48mL OMNIPAQUE IOHEXOL 350 MG/ML SOLN COMPARISON:  CT head without contrast 07/14/2018. FINDINGS: CTA NECK FINDINGS Aortic arch: A 3 vessel arch configuration is present. No significant atherosclerotic changes or stenosis is present. Right carotid system: The right common carotid artery is within normal limits. The bifurcation demonstrates minimal scratched at bifurcation is unremarkable. Minimal atherosclerotic changes are noted in the carotid bulb. Cervical right ICA is otherwise normal. Left carotid system: The left common carotid artery is within normal limits. Bifurcation is unremarkable. Minimal atherosclerotic changes are noted within the carotid bulb. The cervical left ICA is otherwise normal. Vertebral arteries: Vertebral arteries are relatively small bilaterally. Both vertebral arteries originate from the subclavian arteries without a significant stenosis. There is no focal stenosis or vascular injury to either artery in the neck. Skeleton: Vertebral body heights and  AP alignment are anatomic. There straightening and slight reversal of the normal cervical lordosis. No focal lytic or blastic lesions are present. Other neck: No focal mucosal or submucosal lesions are present. Thyroid is within normal limits. Salivary glands are unremarkable. There is no significant cervical adenopathy. Upper chest: The lung apices are clear. There is no focal nodule, mass, or airspace disease. Review of the MIP images confirms the above findings CTA HEAD FINDINGS Anterior circulation: The internal carotid arteries are within normal limits from the skull base through the ICA termini bilaterally. There is mild narrowing of the distal left M1 segment. Anterior communicating artery is patent. ACA and MCA branch vessels are normal. Posterior circulation: PICA origins are normal bilaterally. Basilar artery is normal. Right posterior cerebral artery is of fetal type. Small right P1 segment is noted. PCA branch vessels are normal. Venous sinuses: Dural sinuses are patent. Straight sinus and  deep cerebral veins are intact. Cortical veins are normal. Anatomic variants: Fetal type right posterior cerebral artery. Review of the MIP images confirms the above findings IMPRESSION: 1. Minimal atherosclerotic changes in the cavernous internal carotid arteries bilaterally without a significant stenosis. Otherwise normal CT of the neck. 2. Minimal irregularity of the distal left M1 segment without a significant stenosis. 3. CTA circle-of-Willis otherwise within normal limits. Electronically Signed   By: San Morelle M.D.   On: 07/14/2018 10:38   Mr Brain Wo Contrast  Result Date: 07/14/2018 CLINICAL DATA:  Hypertension and hyperlipidemia. Diabetes. Aphasia. Symptoms began 0730 hours today. EXAM: MRI HEAD WITHOUT CONTRAST TECHNIQUE: Multiplanar, multiecho pulse sequences of the brain and surrounding structures were obtained without intravenous contrast. COMPARISON:  CT evaluation earlier same day  FINDINGS: Brain: Diffusion imaging suggests a punctate acute infarction in the right pons. This is not confirmed on the coronal imaging but is suspicious. Minimal chronic small-vessel change elsewhere affecting the pons. No focal cerebellar insult. Cerebral hemispheres show moderate to severe changes of chronic small vessel disease throughout the white matter. Old lacunar infarctions in the left basal ganglia/radiating white matter tracts. No large vessel territory infarction. No mass lesion, recent hemorrhage, hydrocephalus or extra-axial collection. Vascular: Major vessels at the base of the brain show flow. Skull and upper cervical spine: Negative Sinuses/Orbits: Clear/normal Other: None IMPRESSION: Possible punctate acute infarction in the right side of the pons base axial diffusion imaging and ADC map. This is not confirmed on the coronal study however. Mild chronic small-vessel changes elsewhere affecting the pons. Moderate to severe chronic small-vessel ischemic changes of the cerebral hemispheric white matter. 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 Electronically Signed   By: Nelson Chimes M.D.   On: 07/14/2018 14:31   Ct Head Code Stroke Wo Contrast  Result Date: 07/14/2018 CLINICAL DATA:  Code stroke. Speech disturbance. Confusion. Symptoms began 0730 hours EXAM: CT HEAD WITHOUT CONTRAST TECHNIQUE: Contiguous axial images were obtained from the base of the skull through the vertex without intravenous contrast. COMPARISON:  None. FINDINGS: Brain: There chronic appearing small vessel ischemic changes affecting the cerebral hemispheric white matter. There is an old focal infarction in the left basal ganglia. No sign of acute infarction, mass lesion, hemorrhage, hydrocephalus or extra-axial collection. Vascular: No abnormal vascular finding. Skull: Negative Sinuses/Orbits: Clear/normal Other: None ASPECTS ([place] Stroke Program Early CT Score) - Ganglionic level infarction  (caudate, lentiform nuclei, internal capsule, insula, M1-M3 cortex): 7 - Supraganglionic infarction (M4-M6 cortex): 3 Total score (0-10 with 10 being normal): 10 IMPRESSION: 1. No acute finding. Chronic small-vessel ischemic changes throughout the cerebral hemispheric white matter. 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 2. ASPECTS is 10. 3. These results were communicated to Dr. Leonel Ramsay at 10:09 amon 6/12/2020by text page via the [facility] messaging system. Electronically Signed   By: Nelson Chimes M.D.   On: 07/14/2018 10:10    Microbiology: Recent Results (from the past 240 hour(s))  Novel Coronavirus,NAA,(SEND-OUT TO REF LAB - TAT 24-48 hrs); Hosp Order     Status: None   Collection Time: 07/14/18 11:29 AM   Specimen: Nasopharyngeal Swab; Respiratory  Result Value Ref Range Status   SARS-CoV-2, NAA NOT DETECTED NOT DETECTED Final    Comment: (NOTE) This test was developed and its performance characteristics determined by [employer]. This test has not been FDA cleared or approved. This test has been authorized by FDA under an Emergency Use Authorization (EUA). This test is only authorized for the duration  of time the declaration that circumstances exist justifying the authorization of the emergency use of in vitro diagnostic tests for detection of SARS-CoV-2 virus and/or diagnosis of COVID-19 infection under section 564(b)(1) of the Act, 21 U.S.C. 774JOI-7(O)(6), unless the authorization is terminated or revoked sooner. When diagnostic testing is negative, the possibility of a false negative result should be considered in the context of a patient's recent exposures and the presence of clinical signs and symptoms consistent with COVID-19. An individual without symptoms of COVID-19 and who is not shedding SARS-CoV-2 virus would expect to have a negative (not detected) result in this assay. Performed  At: [facility] [address] [place], [place] [id number] Rush Farmer  MD JG:[id number]    [place]  Final    Comment: Performed at [facility], [place] [address], [place], [place] [address]     Labs: Basic Metabolic Panel: Recent Labs  Lab 07/14/18 1009 07/14/18 1040 07/15/18 1115  NA 139 140 140  K 4.1 3.4* 3.6  CL 104 101 102  CO2 25  --  28  GLUCOSE 147* 126* 174*  BUN 11 10 9   CREATININE 0.81 0.80 0.77  CALCIUM 9.1  --  9.8  MG  --   --  1.8   Liver Function Tests: Recent Labs  Lab 07/14/18 1009  AST 37  ALT 16  ALKPHOS 29*  BILITOT 1.8*  PROT 6.4*  ALBUMIN 4.1   No results for input(s): LIPASE, AMYLASE in the last 168 hours. No results for input(s): AMMONIA in the last 168 hours. CBC: Recent Labs  Lab 07/14/18 1030 07/14/18 1040 07/15/18 1115  WBC 7.8  --  7.3  NEUTROABS 5.9  --   --   HGB 13.6 12.9* 14.0  HCT 38.9* 38.0* 38.3*  MCV 92.0  --  90.5  PLT 198  --  200   Cardiac Enzymes: No results for input(s): CKTOTAL, CKMB, CKMBINDEX, TROPONINI in the last 168 hours. BNP: BNP (last 3 results) No results for input(s): BNP in the last 8760 hours.  ProBNP (last 3 results) No results for input(s): PROBNP in the last 8760 hours.  CBG: Recent Labs  Lab 07/14/18 1637 07/14/18 2122 07/15/18 0600 07/15/18 1220  GLUCAP 95 132* 123* 160*       Signed:  Florencia Reasons MD, PhD  [facility] 07/15/2018, 4:28 PM

## 2018-07-15 NOTE — Progress Notes (Signed)
  Echocardiogram 2D Echocardiogram has been performed.  Darrell [name] 07/15/2018, 3:54 PM

## 2018-07-15 NOTE — Evaluation (Signed)
Speech Language Pathology Evaluation Patient Details Name: Darrell [name] MRN: [id number] DOB: [birth year] Today's Date: 07/15/2018 Time: 5681-2751 SLP Time Calculation (min) (ACUTE ONLY): 20 min  Problem List:  Patient Active Problem List   Diagnosis Date Noted  . TIA (transient ischemic attack) 07/14/2018  . Prolonged QT interval 07/14/2018  . Expressive aphasia 07/14/2018  . Hypertensive emergency 07/14/2018  . Benign prostatic hyperplasia without lower urinary tract symptoms 06/08/2017  . Noncompliance with medications 06/08/2017  . Hypertension 01/30/2015  . Hyperlipidemia 01/30/2015  . Hypokalemia 01/30/2015  . Type 2 diabetes mellitus with hyperlipidemia ([place]) 01/30/2015   Past Medical History:  Past Medical History:  Diagnosis Date  . Dyslipidemia   . Essential hypertension, benign   . Hyperlipidemia   . Thyroid cyst   . Type 2 diabetes mellitus ([place])   . Vitamin D deficiency    Past Surgical History:  Past Surgical History:  Procedure Laterality Date  . MELANOMA EXCISION     Back  . WISDOM TOOTH EXTRACTION     HPI:  Mr. Camran Keady. Arabella Merles.o. male admitted with changes in his speech. CT negative and MRI showed Possible punctate acute infarction in the right side of the pons base axial diffusion imaging and ADC map. 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   Assessment / Plan / Recommendation Clinical Impression  Patient presented to hospital after noticing changes in his speech. He reports he had difficulty finding the words he wanted to say, but he was able to rephrase his message with simpler words. After and hour, he feels his speech/language returned to baseline. His speech/language and cognition appear to be at baseline now. MOCA was given with a score of 29/30 (26 or > being [facility]). He is able to comminucate at the conversational level with therapist about the events leading up to this hospitalization and the care  plan moving forward. No further speech therapy is recommended as his abiliites appear to have returned to baseline.     SLP Assessment  SLP Recommendation/Assessment: Patient does not need any further Speech Lanaguage Pathology Services    Follow Up Recommendations  None               SLP Evaluation Cognition  Overall Cognitive Status: Within Functional Limits for tasks assessed Arousal/Alertness: Awake/alert Orientation Level: Oriented X4 Attention: Focused Focused Attention: Appears intact Memory: Appears intact Awareness: Appears intact Problem Solving: Appears intact Executive Function: Reasoning Reasoning: Appears intact Safety/Judgment: Appears intact       Comprehension  Auditory Comprehension Overall Auditory Comprehension: Appears within functional limits for tasks assessed Yes/No Questions: Within Functional Limits Commands: Within Functional Limits Conversation: Complex Visual Recognition/Discrimination Discrimination: Within Function Limits Reading Comprehension Reading Status: Within funtional limits    Expression Expression Primary Mode of Expression: Verbal Verbal Expression Overall Verbal Expression: Appears within functional limits for tasks assessed Initiation: No impairment Repetition: No impairment Naming: No impairment Pragmatics: No impairment Non-Verbal Means of Communication: Not applicable Written Expression Dominant Hand: Right Written Expression: Within Functional Limits   Oral / Motor  Motor Speech Overall Motor Speech: Appears within functional limits for tasks assessed Respiration: Within functional limits Phonation: Normal Resonance: Within functional limits Articulation: Within functional limitis Intelligibility: Intelligible Motor Planning: Witnin functional limits Motor Speech Errors: Not applicable   GO                   Charlynne Cousins Boleslaus Holloway, MA, CCC-SLP 07/15/2018 12:39 PM

## 2018-07-15 NOTE — Evaluation (Addendum)
Physical Therapy Evaluation Patient Details Name: Darrell [name] MRN: [id number] DOB: 02/09/1955 Today's Date: 07/15/2018   History of Present Illness  Patient is a 63 y/o male presenting to the ED on 07/14/2018 with primary complaints of difficulty speaking. 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 Continued work-up. Past medical history significant of HTN, HLD, and DM type II.    Clinical Impression  Patient admitted with the above listed diagnosis. Patient reports independence at baseline for mobility and ADLs. Patient received up in recliner and agreeable to PT evaluation. Patient with good strength and coordination of LE. Performing functional mobility at general supervision level for safety - does not require physical assist for steadying or mobility. Patient ambulating in hallway and up down 2 steps x 2 without need for handrails or UE support. Patient with reports of feeling at baseline with mobility. No further acute PT needs identified. PT to sign off. Please re-consult if needed.  BP 189/106 at rest at beginning of session, with mobility BP 210/110 with nursing notified.     Follow Up Recommendations No PT follow up    Equipment Recommendations  None recommended by PT    Recommendations for Other Services       Precautions / Restrictions Precautions Precautions: Fall Restrictions Weight Bearing Restrictions: No      Mobility  Bed Mobility               General bed mobility comments: up in recliner  Transfers Overall transfer level: Needs assistance Equipment used: None Transfers: Sit to/from Stand Sit to Stand: Supervision         General transfer comment: for safety - no LOB  Ambulation/Gait Ambulation/Gait assistance: Supervision Gait Distance (Feet): 250 Feet Assistive device: None Gait Pattern/deviations: Step-through pattern Gait velocity: WNL   General Gait Details: steady pace of gait; no LOB  or overt instability; able to navigate obstacles without issue  Stairs Stairs: Yes Stairs assistance: Supervision Stair Management: No rails;Alternating pattern;Forwards Number of Stairs: 2(x2)    Wheelchair Mobility    Modified Rankin (Stroke Patients Only) Modified Rankin (Stroke Patients Only) Pre-Morbid Rankin Score: No symptoms Modified Rankin: Moderate disability     Balance Overall balance assessment: Modified Independent                                           Pertinent Vitals/Pain Pain Assessment: No/denies pain    Home Living Family/patient expects to be discharged to:: Private residence Living Arrangements: Spouse/significant other Available Help at Discharge: Family;Available 24 hours/day Type of Home: Apartment Home Access: Stairs to enter Entrance Stairs-Rails: Right Entrance Stairs-Number of Steps: 12 Home Layout: One level Home Equipment: None      Prior Function Level of Independence: Independent         Comments: works as a Civil engineer, contracting        Extremity/Trunk Assessment   Upper Extremity Assessment Upper Extremity Assessment: Overall WFL for tasks assessed    Lower Extremity Assessment Lower Extremity Assessment: Overall WFL for tasks assessed    Cervical / Trunk Assessment Cervical / Trunk Assessment: Normal  Communication   Communication: No difficulties  Cognition Arousal/Alertness: Awake/alert Behavior During Therapy: WFL for tasks assessed/performed Overall Cognitive Status: Within Functional Limits for tasks assessed  General Comments General comments (skin integrity, edema, etc.): patient with subjective reports of feeling as if he is at baseline    Exercises     Assessment/Plan    PT Assessment Patent does not need any further PT services  PT Problem List         PT Treatment Interventions      PT Goals (Current goals  can be found in the Care Plan section)  Acute Rehab PT Goals Patient Stated Goal: return home today PT Goal Formulation: All assessment and education complete, DC therapy    Frequency     Barriers to discharge        Co-evaluation               AM-PAC PT "6 Clicks" Mobility  Outcome Measure Help needed turning from your back to your side while in a flat bed without using bedrails?: None Help needed moving from lying on your back to sitting on the side of a flat bed without using bedrails?: None Help needed moving to and from a bed to a chair (including a wheelchair)?: None Help needed standing up from a chair using your arms (e.g., wheelchair or bedside chair)?: None Help needed to walk in hospital room?: A Little Help needed climbing 3-5 steps with a railing? : A Little 6 Click Score: 22    End of Session   Activity Tolerance: Patient tolerated treatment well Patient left: in chair;with call bell/phone within reach Nurse Communication: Mobility status PT Visit Diagnosis: Unsteadiness on feet (R26.81)    Time: 1540-0867 PT Time Calculation (min) (ACUTE ONLY): 24 min   Charges:   PT Evaluation $PT Eval Moderate Complexity: 1 Mod           Lanney Gins, PT, DPT Supplemental Physical Therapist 07/15/18 10:29 AM Pager: [phone] Office: [phone]

## 2018-07-15 NOTE — Progress Notes (Signed)
STROKE TEAM PROGRESS NOTE   SUBJECTIVE (INTERVAL HISTORY) No family is at the bedside.  Patient sitting in chair, symptoms all resolved.  Patient stated that he had a episode of expressive aphasia yesterday lasting about 4 hours.  MRI Darrell [name] for right pontine punctate infarct, however, signal quite nonspecific by my read.  Patient BP is still quite high, SBP 180-200s.   OBJECTIVE Vitals:   07/15/18 0414 07/15/18 0735 07/15/18 1005 07/15/18 1223  BP: (!) 179/104 (!) 181/90 (!) 204/114 (!) 196/113  Pulse: 68 65 60 71  Resp: 14 16 13 15   Temp: 97.9 F (36.6 C) 98.8 F (37.1 C)  97.7 F (36.5 C)  TempSrc: Oral   Oral  SpO2: 98% 98% 96% 97%  Weight:      Height:        CBC:  Recent Labs  Lab 07/14/18 1030 07/14/18 1040 07/15/18 1115  WBC 7.8  --  7.3  NEUTROABS 5.9  --   --   HGB 13.6 12.9* 14.0  HCT 38.9* 38.0* 38.3*  MCV 92.0  --  90.5  PLT 198  --  222    Basic Metabolic Panel:  Recent Labs  Lab 07/14/18 1009 07/14/18 1040 07/15/18 1115  NA 139 140 140  K 4.1 3.4* 3.6  CL 104 101 102  CO2 25  --  28  GLUCOSE 147* 126* 174*  BUN 11 10 9   CREATININE 0.81 0.80 0.77  CALCIUM 9.1  --  9.8  MG  --   --  1.8    Lipid Panel:     Component Value Date/Time   CHOL 126 07/15/2018 1115   CHOL 221 (H) 04/19/2018 1549   TRIG 58 07/15/2018 1115   HDL 50 07/15/2018 1115   HDL 45 04/19/2018 1549   CHOLHDL 2.5 07/15/2018 1115   VLDL 12 07/15/2018 1115   LDLCALC 64 07/15/2018 1115   LDLCALC 131 (H) 04/19/2018 1549   HgbA1c:  Lab Results  Component Value Date   HGBA1C 6.2 (H) 07/15/2018   Urine Drug Screen:     Component Value Date/Time   LABOPIA NONE DETECTED 07/14/2018 1135   COCAINSCRNUR NONE DETECTED 07/14/2018 1135   LABBENZ NONE DETECTED 07/14/2018 1135   AMPHETMU NONE DETECTED 07/14/2018 1135   THCU NONE DETECTED 07/14/2018 1135   LABBARB NONE DETECTED 07/14/2018 1135    Alcohol Level     Component Value Date/Time   ETH <10 07/14/2018 1009     IMAGING  Ct Angio Head W Or Wo Contrast Ct Angio Neck W Or Wo Contrast 07/14/2018 IMPRESSION:  1. Minimal atherosclerotic changes in the cavernous internal carotid arteries bilaterally without a significant stenosis. Otherwise normal CT of the neck.  2. Minimal irregularity of the distal left M1 segment without a significant stenosis.  3. CTA circle-of-Willis otherwise within normal limits.   Mr Brain Wo Contrast 07/14/2018 IMPRESSION:  Possible punctate acute infarction in the right side of the pons base axial diffusion imaging and ADC map. This is not confirmed on the coronal study however.  Mild chronic small-vessel changes elsewhere affecting the pons.  Moderate to severe chronic small-vessel ischemic changes of the cerebral hemispheric white matter.  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   Ct Head Code Stroke Wo Contrast 07/14/2018 IMPRESSION:  1. No acute finding. Chronic small-vessel ischemic changes throughout the cerebral hemispheric white matter. 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  2. ASPECTS is 10.   EKG - SB rate 59 BPM. (See cardiology  reading for complete details)    PHYSICAL EXAM  Temp:  [97.7 F (36.5 C)-98.8 F (37.1 C)] 97.7 F (36.5 C) (06/13 1223) Pulse Rate:  [60-74] 71 (06/13 1223) Resp:  [13-19] 15 (06/13 1223) BP: (110-204)/(60-114) 196/113 (06/13 1223) SpO2:  [93 %-98 %] 97 % (06/13 1223)  General - Well nourished, well developed, in no apparent distress.  Ophthalmologic - fundi not visualized due to noncooperation.  Cardiovascular - Regular rate and rhythm.  Mental Status -  Level of arousal and orientation to time, place, and person were intact. Language including expression, naming, repetition, comprehension was assessed and found intact. Attention span and concentration were normal. Recent and remote memory were intact. Fund of Knowledge was assessed and was intact.  Cranial Nerves II - XII - II -  Visual field intact OU. III, IV, VI - Extraocular movements intact. V - Facial sensation intact bilaterally. VII - Facial movement intact bilaterally. VIII - Hearing & vestibular intact bilaterally. X - Palate elevates symmetrically. XI - Chin turning & shoulder shrug intact bilaterally. XII - Tongue protrusion intact.  Motor Strength - The patient's strength was normal in all extremities and pronator drift was absent.  Bulk was normal and fasciculations were absent.   Motor Tone - Muscle tone was assessed at the neck and appendages and was normal.  Reflexes - The patient's reflexes were symmetrical in all extremities and he had no pathological reflexes.  Sensory - Light touch, temperature/pinprick were assessed and were symmetrical.    Coordination - The patient had normal movements in the hands and feet with no ataxia or dysmetria.  Tremor was absent.  Gait and Station - deferred.   ASSESSMENT/PLAN Darrell [name] is a 63 y.o. male with history of DM, Htn and Hld presenting with speech difficulties. He did not receive IV t-PA due to improving deficits.  Cortical TIA with expressive aphasia  CT head - No acute finding. 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   MRI head - questionable punctate acute infarction in the right side of the pons base. 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   CTA H&N  - unremarkable  2D Echo - EF 60-65%  LDL - 64  HgbA1c - 6.2  UDS  - negative  Given cortical TIA, recommend 30-day CardioNet monitor as outpatient to rule out A. fib  VTE prophylaxis - Lovenox  Diet - Heart healthy / carb modified with thin liquids.  aspirin 81 mg daily prior to admission, now on aspirin 81 mg daily and clopidogrel 75 mg daily.  Continue DAPT for 3 weeks and then Plavix alone  Patient counseled to be compliant with his antithrombotic medications  Ongoing aggressive stroke risk factor management  Therapy recommendations:   None  Disposition:  Pending  Hypertension  Blood pressure on the high end . Permissive hypertension (OK if < 220/120) but gradually normalize in 2-3 days . Recommend resume BP meds or add BP meds on discharge . Long-term BP goal normotensive . Close PCP follow-up  Hyperlipidemia  Lipid lowering medication PTA:  Crestor 5 mg daily  LDL 64, goal < 70  Lipitor and Crestor intolerance PTA  Current lipid lowering medication: Pravastatin 20  Continue statin at discharge if able to tolerating low potency statin  Diabetes  HgbA1c 6.2, goal < 7.0  Controlled  SSI  CBG monitoring  PCP follow-up  Other Stroke Risk Factors  Advanced age  Former cigarette smoker - quit  ETOH use, advised to drink  no more than 1 alcoholic beverage per day.  Hx stroke/TIA - by imaging  Other [facility] day # 0  Neurology will sign off. Please call with questions. Pt will follow up with stroke clinic NP at [facility] in about 4 weeks. Thanks for the consult.   Rosalin Hawking, MD PhD Stroke Neurology 07/15/2018 4:14 PM    To contact Stroke Continuity provider, please refer to [email or web address] After hours, contact General Neurology

## 2018-07-15 NOTE — Progress Notes (Signed)
OT Cancellation Note  Patient Details Name: Darrell [name] MRN: [id number] DOB: [birth year]   Cancelled Treatment:    Reason Eval/Treat Not Completed: OT screened, no needs identified, will sign off.  Per PT and SLP, pt back to baseline.   Lucille Passy, OTR/L [place] Pager [phone] Office [phone]   Lucille Passy M 07/15/2018, 12:57 PM

## 2018-07-16 ENCOUNTER — Other Ambulatory Visit: Payer: Self-pay | Admitting: Family Medicine

## 2018-07-18 ENCOUNTER — Other Ambulatory Visit: Payer: Self-pay | Admitting: Cardiology

## 2018-07-18 DIAGNOSIS — G459 Transient cerebral ischemic attack, unspecified: Secondary | ICD-10-CM

## 2018-07-20 ENCOUNTER — Telehealth: Payer: Self-pay | Admitting: *Deleted

## 2018-07-20 NOTE — Telephone Encounter (Signed)
Attempted to contact patient 07/19/18, 07/20/18, to arrange for cardiac event monitor.  No answer, no DPR to leave message.

## 2018-07-21 ENCOUNTER — Telehealth: Payer: Self-pay | Admitting: *Deleted

## 2018-07-21 NOTE — Telephone Encounter (Signed)
07/21/18 Attempted to contact patient to arrange cardiac event monitor.   No answer, no DPR, mail box full.

## 2018-08-02 ENCOUNTER — Other Ambulatory Visit: Payer: Self-pay | Admitting: Family Medicine

## 2018-08-02 ENCOUNTER — Encounter: Payer: Self-pay | Admitting: Cardiology

## 2018-08-07 ENCOUNTER — Other Ambulatory Visit: Payer: Self-pay | Admitting: Family Medicine

## 2018-08-07 ENCOUNTER — Encounter: Payer: Self-pay | Admitting: *Deleted

## 2018-08-07 NOTE — Telephone Encounter (Signed)
Refused. Crestor D/C'd during hospitalization last month.

## 2018-08-30 ENCOUNTER — Ambulatory Visit: Payer: Managed Care, Other (non HMO) | Admitting: Family Medicine

## 2018-09-10 ENCOUNTER — Other Ambulatory Visit: Payer: Self-pay | Admitting: Family

## 2018-09-23 ENCOUNTER — Other Ambulatory Visit: Payer: Self-pay | Admitting: Family Medicine

## 2018-10-02 NOTE — Telephone Encounter (Signed)
Med list corrected 

## 2018-10-29 ENCOUNTER — Other Ambulatory Visit: Payer: Self-pay | Admitting: Family Medicine

## 2021-02-21 IMAGING — MR MRI HEAD WITHOUT CONTRAST
10 of 11 series · 42 of 48 positions shown · non-contrast
Comparison: CT evaluation earlier same day

CLINICAL DATA: Hypertension and hyperlipidemia. Diabetes. Aphasia.
Symptoms began 5095 hours today.

EXAM:
MRI HEAD WITHOUT CONTRAST
TECHNIQUE: Multiplanar, multiecho pulse sequences of the brain and surrounding
structures were obtained without intravenous contrast.

[Series 5: DWI · axial · 3.0mm · 0.88mm/px · z∈[-18,+118]mm · 9 of 94 slices shown (1 of 4)]
[im 1/94]
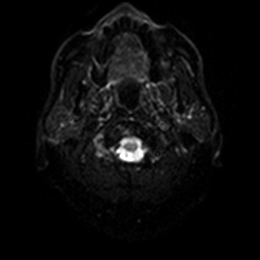
[im 12/94]
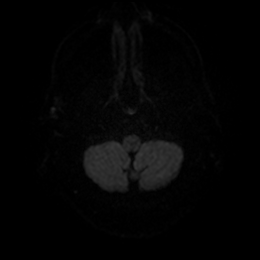
[im 24/94]
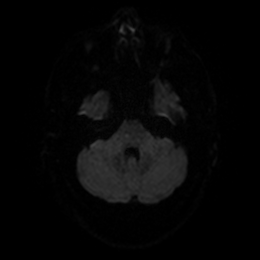
[im 35/94]
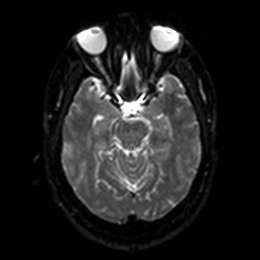
[im 47/94]
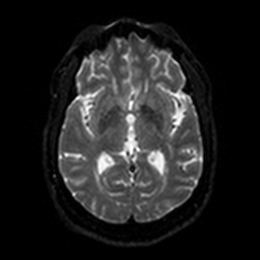
[im 59/94]
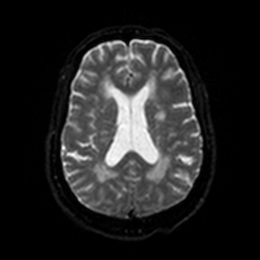
[im 70/94]
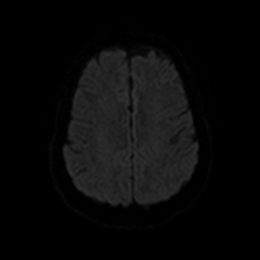
[im 82/94]
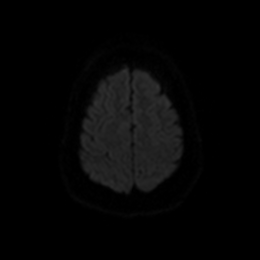
[im 94/94]
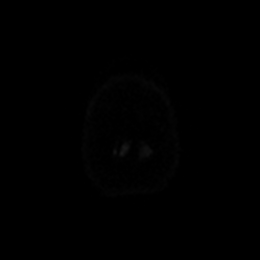

[Series 6: DWI · axial · 3.0mm · 0.88mm/px · z∈[-18,+118]mm · 4 of 47 slices shown (2 of 4)]
[im 1/47]
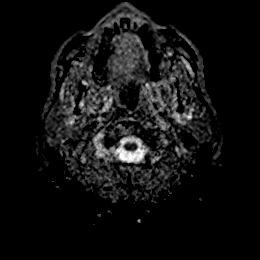
[im 16/47]
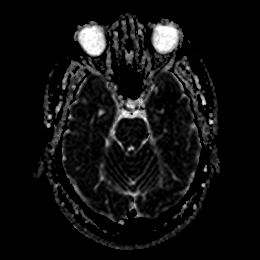
[im 31/47]
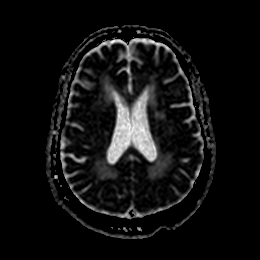
[im 47/47]
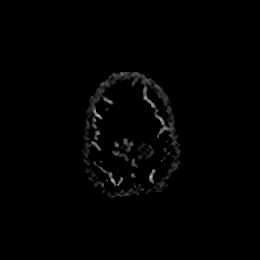

[Series 7: DWI · coronal · 4.0mm · 0.88mm/px · 6 of 64 slices shown (3 of 4)]
[im 1/64]
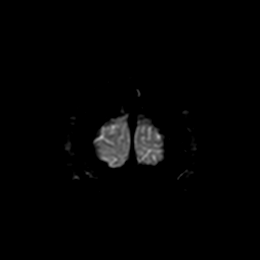
[im 13/64]
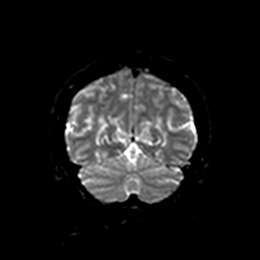
[im 26/64]
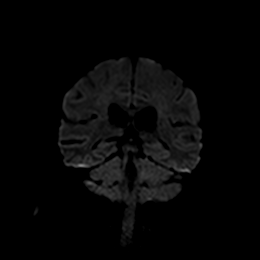
[im 38/64]
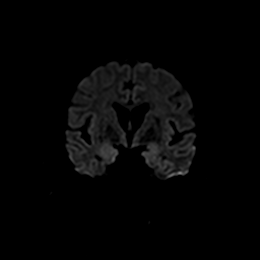
[im 51/64]
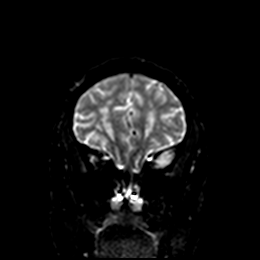
[im 64/64]
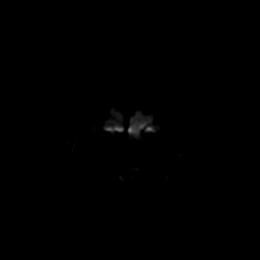

[Series 8: DWI · coronal · 4.0mm · 0.88mm/px · 3 of 32 slices shown (4 of 4)]
[im 1/32]
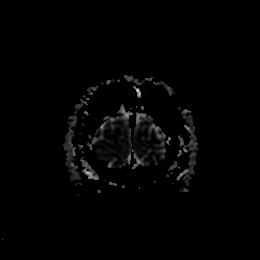
[im 16/32]
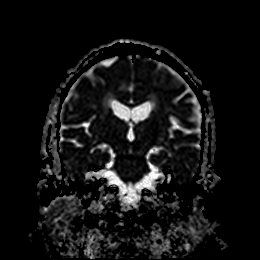
[im 32/32]
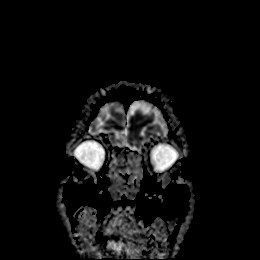

[Series 9: T1 · sagittal · 5.0mm · 0.75mm/px · 2 of 23 slices shown]
[im 1/23]
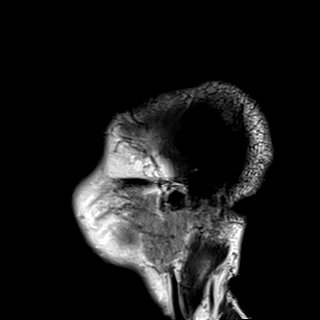
[im 23/23]
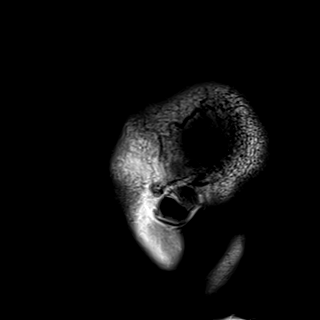

[Series 10: T2 · axial · 5.0mm · 0.72mm/px · z∈[-20,+122]mm · 2 of 25 slices shown]
[im 1/25]
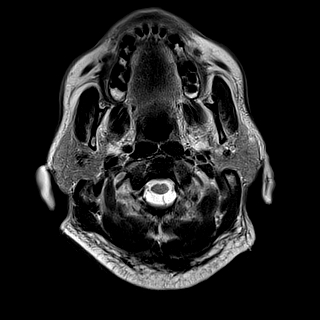
[im 25/25]
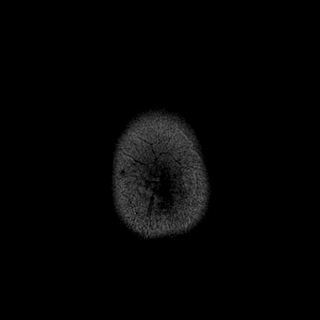

[Series 11: FLAIR · axial · 5.0mm · 0.45mm/px · z∈[-20,+122]mm · 2 of 25 slices shown]
[im 1/25]
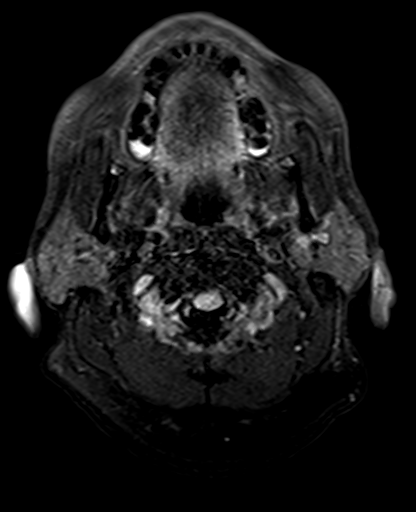
[im 25/25]
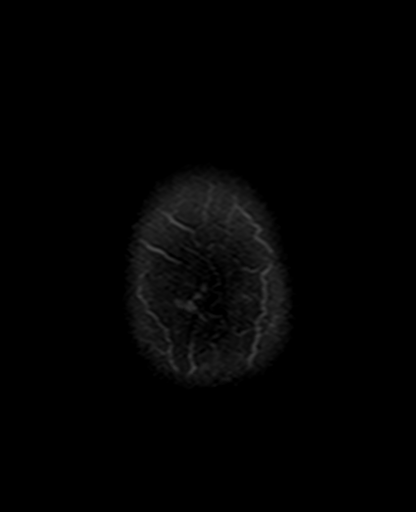

[Series 13: pha_images · axial · 3.0mm · 0.90mm/px · z∈[-33,+141]mm · 5 of 56 slices shown]
[im 1/56]
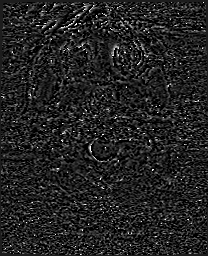
[im 14/56]
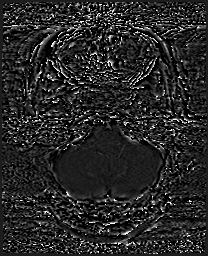
[im 28/56]
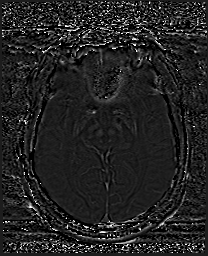
[im 42/56]
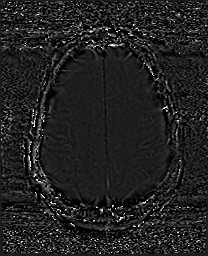
[im 56/56]
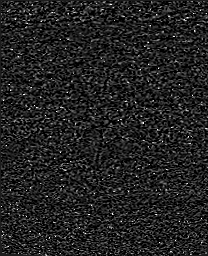

[Series 14: swi_images · axial · 3.0mm · 0.90mm/px · z∈[-33,+141]mm · 6 of 60 slices shown]
[im 1/60]
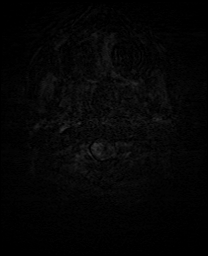
[im 12/60]
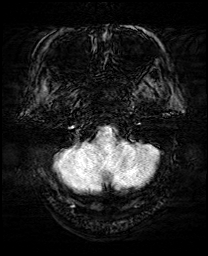
[im 24/60]
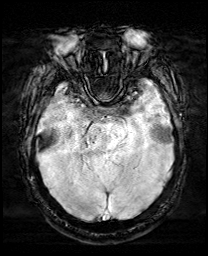
[im 36/60]
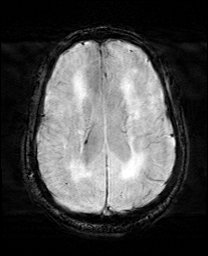
[im 48/60]
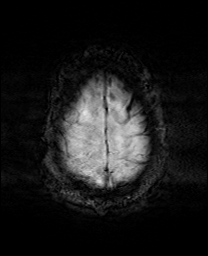
[im 60/60]
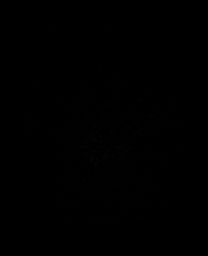

[Series 17: T2 post-contrast · coronal · 5.0mm · 0.72mm/px · 3 of 28 slices shown]
[im 1/28]
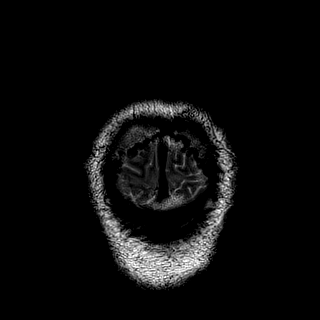
[im 14/28]
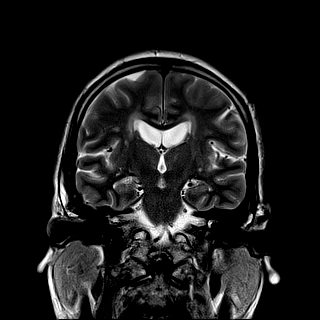
[im 28/28]
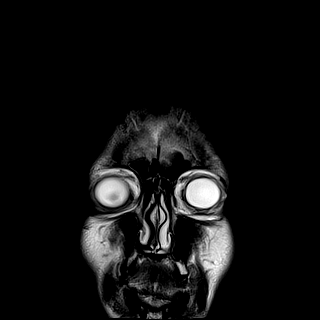

[42 of 48 positions shown; findings below may reference images not displayed]

FINDINGS: Brain: Diffusion imaging suggests a punctate acute infarction in the
right pons. This is not confirmed on the coronal imaging but is
suspicious. Minimal chronic small-vessel change elsewhere affecting
the pons. No focal cerebellar insult. Cerebral hemispheres show
moderate to severe changes of chronic small vessel disease
throughout the white matter. Old lacunar infarctions in the left
basal ganglia/radiating white matter tracts. No large vessel
territory infarction. No mass lesion, recent hemorrhage,
hydrocephalus or extra-axial collection.

Vascular: Major vessels at the base of the brain show flow.

Skull and upper cervical spine: Negative

Sinuses/Orbits: Clear/normal

Other: None
IMPRESSION: Possible punctate acute infarction in the right side of the pons
base axial diffusion imaging and ADC map. This is not confirmed on
the coronal study however. Mild chronic small-vessel changes
elsewhere affecting the pons. Moderate to severe chronic
small-vessel ischemic changes of the cerebral hemispheric white
matter. Old infarction left basal ganglia and radiating white matter
tracts.

## 2021-04-14 ENCOUNTER — Other Ambulatory Visit: Payer: Self-pay

## 2021-04-14 ENCOUNTER — Encounter (HOSPITAL_COMMUNITY): Payer: Self-pay

## 2021-04-14 ENCOUNTER — Inpatient Hospital Stay (HOSPITAL_COMMUNITY)
Admission: EM | Admit: 2021-04-14 | Discharge: 2021-04-15 | DRG: 378 | Disposition: A | Discharge status: Home / Self Care | Payer: Managed Care, Other (non HMO) | Attending: Family Medicine | Admitting: Family Medicine

## 2021-04-14 DIAGNOSIS — E559 Vitamin D deficiency, unspecified: Secondary | ICD-10-CM | POA: Diagnosis present

## 2021-04-14 DIAGNOSIS — Z7984 Long term (current) use of oral hypoglycemic drugs: Secondary | ICD-10-CM

## 2021-04-14 DIAGNOSIS — D62 Acute posthemorrhagic anemia: Secondary | ICD-10-CM

## 2021-04-14 DIAGNOSIS — K2101 Gastro-esophageal reflux disease with esophagitis, with bleeding: Secondary | ICD-10-CM | POA: Diagnosis not present

## 2021-04-14 DIAGNOSIS — Z7982 Long term (current) use of aspirin: Secondary | ICD-10-CM | POA: Diagnosis not present

## 2021-04-14 DIAGNOSIS — Z85820 Personal history of malignant melanoma of skin: Secondary | ICD-10-CM | POA: Diagnosis not present

## 2021-04-14 DIAGNOSIS — K222 Esophageal obstruction: Secondary | ICD-10-CM | POA: Diagnosis present

## 2021-04-14 DIAGNOSIS — E876 Hypokalemia: Secondary | ICD-10-CM | POA: Diagnosis present

## 2021-04-14 DIAGNOSIS — Z20822 Contact with and (suspected) exposure to covid-19: Secondary | ICD-10-CM | POA: Diagnosis present

## 2021-04-14 DIAGNOSIS — Z8673 Personal history of transient ischemic attack (TIA), and cerebral infarction without residual deficits: Secondary | ICD-10-CM

## 2021-04-14 DIAGNOSIS — Z820 Family history of epilepsy and other diseases of the nervous system: Secondary | ICD-10-CM | POA: Diagnosis not present

## 2021-04-14 DIAGNOSIS — K922 Gastrointestinal hemorrhage, unspecified: Secondary | ICD-10-CM | POA: Diagnosis present

## 2021-04-14 DIAGNOSIS — R112 Nausea with vomiting, unspecified: Secondary | ICD-10-CM | POA: Diagnosis present

## 2021-04-14 DIAGNOSIS — Z8249 Family history of ischemic heart disease and other diseases of the circulatory system: Secondary | ICD-10-CM | POA: Diagnosis not present

## 2021-04-14 DIAGNOSIS — K449 Diaphragmatic hernia without obstruction or gangrene: Secondary | ICD-10-CM | POA: Diagnosis present

## 2021-04-14 DIAGNOSIS — E1169 Type 2 diabetes mellitus with other specified complication: Secondary | ICD-10-CM | POA: Diagnosis present

## 2021-04-14 DIAGNOSIS — Z833 Family history of diabetes mellitus: Secondary | ICD-10-CM | POA: Diagnosis not present

## 2021-04-14 DIAGNOSIS — E785 Hyperlipidemia, unspecified: Secondary | ICD-10-CM | POA: Diagnosis present

## 2021-04-14 DIAGNOSIS — K5731 Diverticulosis of large intestine without perforation or abscess with bleeding: Secondary | ICD-10-CM | POA: Diagnosis present

## 2021-04-14 DIAGNOSIS — K625 Hemorrhage of anus and rectum: Secondary | ICD-10-CM | POA: Insufficient documentation

## 2021-04-14 DIAGNOSIS — K635 Polyp of colon: Secondary | ICD-10-CM | POA: Diagnosis not present

## 2021-04-14 DIAGNOSIS — Z7901 Long term (current) use of anticoagulants: Secondary | ICD-10-CM

## 2021-04-14 DIAGNOSIS — K2100 Gastro-esophageal reflux disease with esophagitis, without bleeding: Secondary | ICD-10-CM | POA: Diagnosis present

## 2021-04-14 DIAGNOSIS — I10 Essential (primary) hypertension: Secondary | ICD-10-CM | POA: Diagnosis present

## 2021-04-14 DIAGNOSIS — Z79899 Other long term (current) drug therapy: Secondary | ICD-10-CM

## 2021-04-14 DIAGNOSIS — K921 Melena: Secondary | ICD-10-CM

## 2021-04-14 DIAGNOSIS — N400 Enlarged prostate without lower urinary tract symptoms: Secondary | ICD-10-CM | POA: Diagnosis present

## 2021-04-14 DIAGNOSIS — D125 Benign neoplasm of sigmoid colon: Secondary | ICD-10-CM | POA: Diagnosis present

## 2021-04-14 HISTORY — DX: Acute posthemorrhagic anemia: D62

## 2021-04-14 HISTORY — DX: Personal history of transient ischemic attack (TIA), and cerebral infarction without residual deficits: Z86.73

## 2021-04-14 LAB — RESP PANEL BY RT-PCR (FLU A&B, COVID) ARPGX2
Influenza A by PCR: NEGATIVE
Influenza B by PCR: NEGATIVE
SARS Coronavirus 2 by RT PCR: NEGATIVE

## 2021-04-14 LAB — COMPREHENSIVE METABOLIC PANEL
ALT: 13 U/L (ref 0–44)
AST: 20 U/L (ref 15–41)
Albumin: 4.4 g/dL (ref 3.5–5.0)
Alkaline Phosphatase: 34 U/L — ABNORMAL LOW (ref 38–126)
Anion gap: 12 (ref 5–15)
BUN: 29 mg/dL — ABNORMAL HIGH (ref 8–23)
CO2: 26 mmol/L (ref 22–32)
Calcium: 9.1 mg/dL (ref 8.9–10.3)
Chloride: 102 mmol/L (ref 98–111)
Creatinine, Ser: 0.91 mg/dL (ref 0.61–1.24)
GFR, Estimated: >60 mL/min (ref >60)
Glucose, Bld: 137 mg/dL — ABNORMAL HIGH (ref 70–99)
Potassium: 3.3 mmol/L — ABNORMAL LOW (ref 3.5–5.1)
Sodium: 140 mmol/L (ref 135–145)
Total Bilirubin: 1 mg/dL (ref 0.3–1.2)
Total Protein: 7.1 g/dL (ref 6.5–8.1)

## 2021-04-14 LAB — PROTIME-INR
INR: 1.1 (ref 0.8–1.2)
Prothrombin Time: 14.6 seconds (ref 11.4–15.2)

## 2021-04-14 LAB — CBC
HCT: 24.8 % — ABNORMAL LOW (ref 39.0–52.0)
Hemoglobin: 8.4 g/dL — ABNORMAL LOW (ref 13.0–17.0)
MCH: 32.8 pg (ref 26.0–34.0)
MCHC: 33.9 g/dL (ref 30.0–36.0)
MCV: 96.9 fL (ref 80.0–100.0)
Platelets: 199 10*3/uL (ref 150–400)
RBC: 2.56 MIL/uL — ABNORMAL LOW (ref 4.22–5.81)
RDW: 12.5 % (ref 11.5–15.5)
WBC: 10.2 10*3/uL (ref 4.0–10.5)
nRBC: 0 % (ref 0.0–0.2)

## 2021-04-14 LAB — GLUCOSE, CAPILLARY
Glucose-Capillary: 86 mg/dL (ref 70–99)
Glucose-Capillary: 95 mg/dL (ref 70–99)
Glucose-Capillary: 80 mg/dL (ref 70–99)

## 2021-04-14 LAB — POC OCCULT BLOOD, ED: Fecal Occult Bld: POSITIVE — AB

## 2021-04-14 LAB — HIV ANTIBODY (ROUTINE TESTING W REFLEX): HIV Screen 4th Generation wRfx: NONREACTIVE

## 2021-04-14 LAB — ABO/RH: ABO/RH(D): AB POS

## 2021-04-14 MED ORDER — ADULT MULTIVITAMIN W/MINERALS CH
1.0000 | ORAL_TABLET | Freq: Every day | ORAL | Status: DC
  Filled 2021-04-14: qty 1

## 2021-04-14 MED ORDER — ONDANSETRON HCL 4 MG PO TABS: 4.0000 mg | ORAL_TABLET | Freq: Four times a day (QID) | ORAL | Status: DC | PRN

## 2021-04-14 MED ORDER — PEG-KCL-NACL-NASULF-NA ASC-C 100 G PO SOLR
0.5000 | ORAL | Status: AC
  Administered 2021-04-14 – 2021-04-15 (×2): 100 g via ORAL
  Filled 2021-04-14: qty 1

## 2021-04-14 MED ORDER — PANTOPRAZOLE SODIUM 40 MG IV SOLR
40.0000 mg | Freq: Once | INTRAVENOUS | Status: AC
  Administered 2021-04-14: 40 mg via INTRAVENOUS
  Filled 2021-04-14: qty 10

## 2021-04-14 MED ORDER — ONDANSETRON HCL 4 MG/2ML IJ SOLN: 4.0000 mg | Freq: Four times a day (QID) | INTRAMUSCULAR | Status: DC | PRN

## 2021-04-14 MED ORDER — PANTOPRAZOLE SODIUM 40 MG IV SOLR
40.0000 mg | Freq: Two times a day (BID) | INTRAVENOUS | Status: DC
  Administered 2021-04-14 – 2021-04-15 (×2): 40 mg via INTRAVENOUS
  Filled 2021-04-14 (×2): qty 10

## 2021-04-14 MED ORDER — SODIUM CHLORIDE 0.9 % IV BOLUS
1000.0000 mL | Freq: Once | INTRAVENOUS | Status: AC
Start: 2021-04-14 — End: 2021-04-14
  Administered 2021-04-14: 1000 mL via INTRAVENOUS

## 2021-04-14 MED ORDER — ALPRAZOLAM 1 MG PO TABS
1.0000 mg | ORAL_TABLET | Freq: Every day | ORAL | Status: DC
  Filled 2021-04-14: qty 1

## 2021-04-14 MED ORDER — LACTATED RINGERS IV SOLN: INTRAVENOUS | Status: DC

## 2021-04-14 MED ORDER — ACETAMINOPHEN 325 MG PO TABS: 650.0000 mg | ORAL_TABLET | Freq: Four times a day (QID) | ORAL | Status: DC | PRN

## 2021-04-14 MED ORDER — INSULIN ASPART 100 UNIT/ML IJ SOLN
0.0000 [IU] | Freq: Three times a day (TID) | INTRAMUSCULAR | Status: DC
  Filled 2021-04-14: qty 0.09

## 2021-04-14 MED ORDER — SERTRALINE HCL 50 MG PO TABS
50.0000 mg | ORAL_TABLET | Freq: Every day | ORAL | Status: DC
  Filled 2021-04-14: qty 1

## 2021-04-14 MED ORDER — POTASSIUM CHLORIDE CRYS ER 20 MEQ PO TBCR
40.0000 meq | EXTENDED_RELEASE_TABLET | Freq: Once | ORAL | Status: AC
Start: 2021-04-14 — End: 2021-04-14
  Administered 2021-04-14: 40 meq via ORAL
  Filled 2021-04-14: qty 2

## 2021-04-14 MED ORDER — ACETAMINOPHEN 650 MG RE SUPP: 650.0000 mg | Freq: Four times a day (QID) | RECTAL | Status: DC | PRN

## 2021-04-14 NOTE — Consult Note (Addendum)
? ? ?Consultation ? ?Referring Provider:  [facility]/KC Ramesh ?Primary Care Physician:  Jilda Panda, MD ?Primary Gastroenterologist:  remote Dr Sharlett Iles 2013 ? ?Reason for Consultation:  GI bleed ? ?HPI: Darrell [name] is a 66 y.o. male, who is admitted through the emergency room today, after he had been seen by his PCP and found to have hemoglobin of 8.4. ?Patient says that he had onset of rectal bleeding on Saturday, 04/11/2021. ?He says he had the urge for a bowel movement and then passed a lot of red blood in the commode.  This was followed by several more episodes that day.  He did have some associated nausea and vomiting, weakness and diaphoresis, no hematemesis. ?No abdominal pain or cramping associated, and says all of the blood was red blood, no black stool or blood. ?He stayed in bed all day Sunday feeling very weak but did not have any further stools.  He tried to go to work yesterday was feeling very weak and lightheaded and then had 1 more episode smaller volume of dark red blood.  He was seen by his PCP. ?Today he has not had any further bowel movements, has not eaten, feels weak and fatigued ? ?He had been on chronic antiplatelet therapy for history of TIA in 2020.  He is supposed to be on Plavix but says that he ran out about a month ago and has been using a family members Eliquis over the past month.  His last dose of Eliquis was Friday. ?No regular aspirin or NSAIDs, no EtOH use. ? ?Hemodynamically stable on admission ?Labs WBC 10.2/hemoglobin 8.4/hematocrit 24.8/platelets 199 ?Heme positive in the ER, ?Respiratory panel negative ?Potassium 3.3/BUN 29/creatinine 0.91 ?LFTs within normal limits ?Pro time 14.6/INR 1.1 ? ?Patient says he has had regular labs through his PCP and has never had  ? ?any history of anemia-last hemoglobin available in epic June 2020 12.9/hematocrit 38 ? ?Patient had colonoscopy and EGD per Dr. Sharlett Iles in 2013.  Colonoscopy was done for screening and was positive for sigmoid  diverticulosis, no polyps ?EGD at that same time with finding of a distal Schatzki's ring which was dilated to Wilbarger and also noted to have an inlet patch in the proximal esophagus. ? ?Past Medical History:  ?Diagnosis Date  ? Dyslipidemia   ? Essential hypertension, benign   ? Hyperlipidemia   ? Thyroid cyst   ? Type 2 diabetes mellitus ([place])   ? Vitamin D deficiency   ? ? ?Past Surgical History:  ?Procedure Laterality Date  ? MELANOMA EXCISION    ? Back  ? WISDOM TOOTH EXTRACTION    ? ? ?Prior to Admission medications   ?Medication Sig Start Date End Date Taking? Authorizing Provider  ?ALPRAZolam (XANAX) 1 MG tablet Take 1 mg by mouth daily.   Yes [provider]  ?Multiple Vitamin (MULTIVITAMIN) tablet Take 1 tablet by mouth daily.   Yes [provider]  ?potassium chloride (KLOR-CON M) 10 MEQ tablet Take 10 mEq by mouth daily. 02/26/21  Yes [provider]  ?RYBELSUS 7 MG TABS Take 7 mg by mouth daily. 04/11/21  Yes [provider]  ?sertraline (ZOLOFT) 50 MG tablet Take 50 mg by mouth daily. 02/26/21  Yes [provider]  ?SYNJARDY XR 25-1000 MG TB24 Take 1 tablet by mouth daily. 01/22/21  Yes [provider]  ?Vitamin D, Ergocalciferol, 50000 units CAPS Take 50,000 Units by mouth once a week. Saturday's   Yes [provider]  ? ? ?Current  Facility-Administered Medications  ?Medication Dose Route Frequency Provider Last Rate Last Admin  ? acetaminophen (TYLENOL) tablet 650 mg  650 mg Oral Q6H PRN Kc, Ramesh, MD      ? Or  ? acetaminophen (TYLENOL) suppository 650 mg  650 mg Rectal Q6H PRN Kc, Ramesh, MD      ? ALPRAZolam Duanne Moron) tablet 1 mg  1 mg Oral Daily Kc, Ramesh, MD      ? insulin aspart (novoLOG) injection 0-9 Units  0-9 Units Subcutaneous TID WC Kc, Ramesh, MD      ? lactated ringers infusion   Intravenous Continuous Kc, Ramesh, MD      ? multivitamin with minerals tablet 1 tablet  1 tablet Oral Daily Kc, Ramesh, MD      ? ondansetron  (ZOFRAN) tablet 4 mg  4 mg Oral Q6H PRN Kc, Ramesh, MD      ? Or  ? ondansetron (ZOFRAN) injection 4 mg  4 mg Intravenous Q6H PRN Kc, Ramesh, MD      ? pantoprazole (PROTONIX) injection 40 mg  40 mg Intravenous Q12H Kc, Ramesh, MD      ? sertraline (ZOLOFT) tablet 50 mg  50 mg Oral Daily Antonieta Pert, MD      ? ? ?Allergies as of 04/14/2021  ? (No Known Allergies)  ? ? ?Family History  ?Problem Relation Age of Onset  ? Pulmonary embolism Mother   ? Diabetes Father   ? Hypertension Father   ? Alzheimer's disease Father   ? Diabetes Maternal Grandmother   ? Colon cancer Neg Hx   ? ? ?Social History  ? ?Socioeconomic History  ? Marital status: Single  ?  Spouse name: Not on file  ? Number of children: 4  ? Years of education: Not on file  ? Highest education level: Not on file  ?Occupational History  ?  Employer: [employer]  ?Tobacco Use  ? Smoking status: Never  ? Smokeless tobacco: Never  ?Vaping Use  ? Vaping Use: Never used  ?Substance and Sexual Activity  ? Alcohol use: Yes  ?  Comment: occasional  ? Drug use: No  ? Sexual activity: Not on file  ?Other Topics Concern  ? Not on file  ?Social History Narrative  ? Daily caffeine   ? ?Social Determinants of Health  ? ?Financial Resource Strain: Not on file  ?Food Insecurity: Not on file  ?Transportation Needs: Not on file  ?Physical Activity: Not on file  ?Stress: Not on file  ?Social Connections: Not on file  ?Intimate Partner Violence: Not on file  ? ? ?Review of Systems: ?Pertinent positive and negative review of systems were noted in the above HPI section.  All other review of systems was otherwise negative.  ? ?Physical Exam: ?Vital signs in last 24 hours: ?Temp:  [97.8 ?F (36.6 ?C)-98.7 ?F (37.1 ?C)] 98.7 ?F (37.1 ?C) (03/14 1510) ?Pulse Rate:  [63-84] 63 (03/14 1510) ?Resp:  [16-34] 16 (03/14 1510) ?BP: (159-171)/(82-98) 159/86 (03/14 1510) ?SpO2:  [96 %-100 %] 99 % (03/14 1510) ?Weight:  [64 kg] 64 kg (03/14 1217) ?  ?General:   Alert,  Well-developed,  well-nourished, older African-American male pleasant and cooperative in NAD ?Head:  Normocephalic and atraumatic. ?Eyes:  Sclera clear, no icterus.   Conjunctiva pink. ?Ears:  Normal auditory acuity. ?Nose:  No deformity, discharge,  or lesions. ?Mouth:  No deformity or lesions.   ?Neck:  Supple; no masses or thyromegaly. ?Lungs:  Clear throughout to auscultation.   No  wheezes, crackles, or rhonchi. ? Heart:  Regular rate and rhythm; no murmurs, clicks, rubs,  or gallops. ?Abdomen:  Soft,nontender, BS active,nonpalp mass or hsm.   ?Rectal: Not done ?Msk:  Symmetrical without gross deformities. Marland Kitchen ?Pulses:  Normal pulses noted. ?Extremities:  Without clubbing or edema. ?Neurologic:  Alert and  oriented x4;  grossly normal neurologically. ?Skin:  Intact without significant lesions or rashes.Marland Kitchen ?Psych:  Alert and cooperative. Normal mood and affect, anxious ? ?Intake/Output from previous day: ?No intake/output data recorded. ?Intake/Output this shift: ?No intake/output data recorded. ? ?Lab Results: ?Recent Labs  ?  04/14/21 ?1232  ?WBC 10.2  ?HGB 8.4*  ?HCT 24.8*  ?PLT 199  ? ?BMET ?Recent Labs  ?  04/14/21 ?1232  ?NA 140  ?K 3.3*  ?CL 102  ?CO2 26  ?GLUCOSE 137*  ?BUN 29*  ?CREATININE 0.91  ?CALCIUM 9.1  ? ?LFT ?Recent Labs  ?  04/14/21 ?1232  ?PROT 7.1  ?ALBUMIN 4.4  ?AST 20  ?ALT 13  ?ALKPHOS 34*  ?BILITOT 1.0  ? ?PT/INR ?No results for input(s): LABPROT, INR in the last 72 hours. ?Hepatitis Panel ?No results for input(s): HEPBSAG, HCVAB, HEPAIGM, HEPBIGM in the last 72 hours. ? ? ? ?IMPRESSION:  ?#71 66 year old African-American male with acute GI bleed onset Friday, 04/11/2021 with multiple episodes of bright red blood per rectum, painless.  Bleeding occurred in setting of Eliquis use-last dose Eliquis Friday, 04/11/2021 ? ?Hemoglobin down about 3.5 g from his baseline ? ?Pt has previously documented diverticulosis on colonoscopy in 2013, suspect acute diverticular hemorrhage, cannot rule out bleed secondary to  neoplasm, AVMs, or less likely upper GI source i.e. peptic ulcer disease ? ?#2 anemia normocytic secondary to acute blood loss ?#3 history of previous TIA/small vessel disease on MRI 2020-on chronic antiplatelet-had been

## 2021-04-14 NOTE — Hospital Course (Signed)
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  Onset about 4 days ago noticing dark red blood in the stool multiple episodes and that have largely subsided apparently had episode of syncope in the beginning.  Also complains nausea vomiting but no hematemesis.  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 ?In the ED, vitals stable, labs with potassium 3.3, hemoglobin 8.4 g previous baseline in June/2020 was 12 gm.  He was given Protonix and admission was requested for further management. ? ?

## 2021-04-14 NOTE — ED Triage Notes (Addendum)
Patient reports that he has been having dark red blood with clots in his stool x 3 days. ? ? ?Patient added in triage that he passed out  3 days ago in his bathroom floor and had blood in his stool incontinently. ?

## 2021-04-14 NOTE — H&P (Signed)
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  Onset about 4 days ago noticing dark red blood in the stool multiple episodes and that have largely subsided apparently had episode of syncope in the beginning.  Also complains nausea vomiting but no hematemesis.  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 In the ED, vitals stable, labs with potassium 3.3, hemoglobin 8.4 g previous baseline in June/2020 was 12 gm.  He was given Protonix and admission was requested for further management. He currently denies any nausea vomiting abdomen pain, his last BM was yesterday morning and " little blood".Last c scope 10 yrs ago and was "clear" he works as Water quality scientist at [employer].  Assessment/Plan  GI bleeding-dark-colored attributed Acute blood loss anemia FOBT positive stool: Onset 4 days PTA with multiple episodes of rectal bleeding while taking Eliquis.  Last dose of Eliquis 5 days PTA.  Baseline hemoglobin around 12, in 2020, not significantly low 0.4 g.  Keep on clear liquid diet n.p.o. past midnight GI consult, PPI twice daily hold his Plavix (that he ran out of and was taking someone else's Eliquis).  Type and screen, 2 wide bore IV access  Hypertension: BP fairly controlled monitor  Hypokalemia: Repleting po  Type 2 diabetes mellitus with hyperlipidemia: Hold  home meds and sliding scale insulin, resume statin  Benign prostatic hyperplasia without lower urinary tract symptoms, monitor voiding  Hx of transient ischemic attack/stroke: Patient was supposed to on Plavix but taking Eliquis instead as he ran out.  Holding further  Body mass index is 23.46 kg/m.   Severity of Illness: The appropriate patient status for this patient is INPATIENT. Inpatient status is judged to be reasonable and necessary in order to provide the required intensity of service to ensure the patient's safety. The patient's presenting symptoms, physical exam findings, and initial radiographic and laboratory data in the context of their chronic comorbidities is felt to place them at high risk for further clinical deterioration. Furthermore, it is not anticipated that the patient will be medically stable for discharge from the hospital within 2 midnights of admission.   * I certify that at the point of admission it is my clinical judgment that the patient will require inpatient hospital care spanning beyond 2 midnights from the point of admission due to high intensity of service, high risk for further deterioration and high frequency of surveillance required.*   DVT prophylaxis: SCDs Start: 04/14/21 1437 Code Status:   Code Status: Full Code  Family Communication: Admission, patients condition and plan of care including tests being ordered have been discussed with the patient who indicate understanding and agree with the plan and Code Status.  Consults called:  GI by EDP  Review of Systems: All systems were reviewed and were negative except as mentioned in HPI above. Negative for fever Negative for chest  pain Negative for shortness of breath  Past Medical History:  Diagnosis Date   Dyslipidemia    Essential hypertension, benign    Hyperlipidemia    Thyroid cyst    Type 2 diabetes mellitus ([facility])    Vitamin D deficiency     Past Surgical History:  Procedure Laterality  Date   MELANOMA EXCISION     Back   WISDOM TOOTH EXTRACTION       reports that he has never smoked. He has never used smokeless tobacco. He reports current alcohol use. He reports that he does not use drugs.  No Known Allergies  Family History  Problem Relation Age of Onset   Pulmonary embolism Mother    Diabetes Father    Hypertension Father    Alzheimer's disease Father    Diabetes Maternal Grandmother    Colon cancer Neg Hx      Prior to Admission medications   Medication Sig Start Date End Date Taking? Authorizing Provider  ALPRAZolam Prudy Feeler) 1 MG tablet Take 1 mg by mouth daily.   Yes [provider]  aspirin EC 81 MG tablet Take 162 mg by mouth daily. Swallow whole.   Yes [provider]  Multiple Vitamin (MULTIVITAMIN) tablet Take 1 tablet by mouth daily.   Yes [provider]  potassium chloride (KLOR-CON M) 10 MEQ tablet Take 10 mEq by mouth daily. 02/26/21  Yes [provider]  RYBELSUS 7 MG TABS Take 7 mg by mouth daily. 04/11/21  Yes [provider]  sertraline (ZOLOFT) 50 MG tablet Take 50 mg by mouth daily. 02/26/21  Yes [provider]  SYNJARDY XR 25-1000 MG TB24 Take 1 tablet by mouth daily. 01/22/21  Yes [provider]  Vitamin D, Ergocalciferol, 50000 units CAPS Take 50,000 Units by mouth once a week. Saturday's   Yes [provider]    Physical Exam: Vitals:   04/14/21 1315 04/14/21 1330 04/14/21 1345 04/14/21 1400  BP: (!) 159/94 (!) 162/98 (!) 163/82 (!) 170/82  Pulse: 77  70 74  Resp: (!) 34 20 (!) 22 (!) 22  Temp:      TempSrc:      SpO2: 98% 98% 99% 96%  Weight:      Height:        General exam: AAOx3,NAD, weak appearing. HEENT:Oral mucosa moist, Ear/Nose WNL grossly, dentition normal. Respiratory system: bilaterally clear,no wheezing or crackles,no use of accessory muscle Cardiovascular system: S1 & S2 +, No JVD. Gastrointestinal system: Abdomen soft, NT,ND, BS+ Nervous  System:Alert, awake, moving extremities and grossly nonfocal Extremities: No edema, distal peripheral pulses palpable.  Skin: No rashes,no icterus. MSK: Normal muscle bulk,tone, power   Labs on Admission: I have personally reviewed following labs and imaging studies  CBC: Recent Labs  Lab 04/14/21 1232  WBC 10.2  HGB 8.4*  HCT 24.8*  MCV 96.9  PLT 199   Basic Metabolic Panel: Recent Labs  Lab 04/14/21 1232  NA 140  K 3.3*  CL 102  CO2 26  GLUCOSE 137*  BUN 29*  CREATININE 0.91  CALCIUM 9.1   GFR: Estimated Creatinine Clearance: 72.3 mL/min (by C-G formula based on SCr of 0.91 mg/dL). Liver Function Tests: Recent Labs  Lab 04/14/21 1232  AST 20  ALT 13  ALKPHOS 34*  BILITOT 1.0  PROT 7.1  ALBUMIN 4.4   No results for input(s): LIPASE, AMYLASE in the last 168 hours. No results for input(s): AMMONIA in the last 168 hours. Coagulation Profile: No results  for input(s): INR, PROTIME in the last 168 hours. Cardiac Enzymes: No results for input(s): CKTOTAL, CKMB, CKMBINDEX, TROPONINI in the last 168 hours. BNP (last 3 results) No results for input(s): PROBNP in the last 8760 hours. HbA1C: No results for input(s): HGBA1C in the last 72 hours. CBG: No results for input(s): GLUCAP in the last 168 hours. Lipid Profile: No results for input(s): CHOL, HDL, LDLCALC, TRIG, CHOLHDL, LDLDIRECT in the last 72 hours. Thyroid Function Tests: No results for input(s): TSH, T4TOTAL, FREET4, T3FREE, THYROIDAB in the last 72 hours. Anemia Panel: No results for input(s): VITAMINB12, FOLATE, FERRITIN, TIBC, IRON, RETICCTPCT in the last 72 hours. Urine analysis:    Component Value Date/Time   COLORURINE YELLOW 07/14/2018 1135   APPEARANCEUR CLEAR 07/14/2018 1135   APPEARANCEUR Clear 04/19/2018 1603   LABSPEC 1.033 (H) 07/14/2018 1135   PHURINE 7.0 07/14/2018 1135   GLUCOSEU NEGATIVE 07/14/2018 1135   HGBUR NEGATIVE 07/14/2018 1135   BILIRUBINUR NEGATIVE 07/14/2018 1135    BILIRUBINUR Negative 04/19/2018 1603   KETONESUR NEGATIVE 07/14/2018 1135   PROTEINUR NEGATIVE 07/14/2018 1135   UROBILINOGEN negative 01/30/2015 1136   NITRITE NEGATIVE 07/14/2018 1135   LEUKOCYTESUR NEGATIVE 07/14/2018 1135    Radiological Exams on Admission: No results found.    Lanae Boast MD [facility]  If 7PM-7AM, please contact night-coverage [email or web address]  04/14/2021, 2:39 PM

## 2021-04-14 NOTE — ED Provider Notes (Addendum)
?[facility] DEPT ?Provider Note ? ? ?CSN: [id number] ?Arrival date & time: 04/14/21  1210 ? ?  ? ?History ? ?Chief Complaint  ?Patient presents with  ? GI Bleeding  ? ? ?Darrell [name] is a 66 y.o. male. ? ?HPI ? ? Pt is a 67 y/o male with a h/o hld, htn, thyroid cyst, dm, who presents to the ed today for eval of GI bleeding. That started 4 days ago.  Reports the blood was dark red. He had multiple episodes throughout the night and since then the episodes have largely subsided. At the start of his symptoms he reports that he had a syncopal episode. He denies that he sustained head trauma at that time. Since then he has had no headache or neuroligic complaints. ? ?He further reports some nausea and vomiting but denies hematemesis. He denies any abdominal pain.  ? ?States that he was previously on plavix. 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 He states he has been taking this for about 1 month. He states his last dose of this was 5 days ago.  ? ?He as currently been taking ASA as well, 81 mg. Denies other nsaid use. Drinks about 2-3 times per week. Drinks about 2 drinks when he does drink. He drinks beer, liquor and wine. ? ?Home Medications ?Prior to Admission medications   ?Medication Sig Start Date End Date Taking? Authorizing Provider  ?ALPRAZolam (XANAX) 1 MG tablet Take 1 mg by mouth daily.   Yes [provider]  ?Multiple Vitamin (MULTIVITAMIN) tablet Take 1 tablet by mouth daily.   Yes [provider]  ?potassium chloride (KLOR-CON M) 10 MEQ tablet Take 10 mEq by mouth daily. 02/26/21  Yes [provider]  ?RYBELSUS 7 MG TABS Take 7 mg by mouth daily. 04/11/21  Yes [provider]  ?sertraline (ZOLOFT) 50 MG tablet Take 50 mg by mouth daily. 02/26/21  Yes [provider]  ?SYNJARDY XR 25-1000 MG TB24 Take 1 tablet by mouth daily. 01/22/21  Yes [provider]  ?Vitamin D, Ergocalciferol, 50000 units CAPS Take 50,000 Units by mouth once a week. Saturday's   Yes [provider]  ?   ? ?Allergies    ?Patient has no known allergies.   ? ?Review of Systems   ?Review of Systems ?See HPI for pertinent positives or negatives. ? ?Physical Exam ?Updated Vital Signs ?BP (!) 159/86 (BP Location: Right Arm)   Pulse 63   Temp 98.7 ?F (37.1 ?C)   Resp 16   Ht '5\' 5"'$  (1.651 m)   Wt 64 kg   SpO2 99%   BMI 23.46 kg/m?  ?Physical Exam ?Vitals and nursing note reviewed.  ?Constitutional:   ?   General: He is not in acute distress. ?   Appearance: He is well-developed.  ?HENT:  ?   Head: Normocephalic and atraumatic.  ?Eyes:  ?   Conjunctiva/sclera: Conjunctivae normal.  ?Cardiovascular:  ?   Rate and Rhythm: Normal rate and regular rhythm.  ?   Heart sounds: Normal heart sounds. No murmur heard. ?Pulmonary:  ?   Effort: Pulmonary effort is normal. No respiratory distress.  ?   Breath sounds: Normal breath sounds. No wheezing, rhonchi or rales.  ?Abdominal:  ?   Palpations: Abdomen is soft.  ?   Tenderness: There is no abdominal tenderness.  ?Genitourinary: ?   Comments: DRE performed.  No gross blood on exam. No melena.  ?Musculoskeletal:     ?   General: No swelling.  ?   Cervical back: Neck supple.  ?Skin: ?   General: Skin is warm and dry.  ?   Capillary Refill: Capillary refill takes less than 2 seconds.  ?Neurological:  ?   Mental Status: He is alert.  ?Psychiatric:     ?   Mood and Affect: Mood normal.  ? ? ?ED Results / Procedures / Treatments   ?Labs ?(all labs ordered are listed, but only abnormal results are displayed) ?Labs Reviewed  ?COMPREHENSIVE METABOLIC PANEL - Abnormal; Notable for the following components:  ?    Result Value  ? Potassium 3.3 (*)   ? Glucose, Bld 137 (*)   ? BUN 29 (*)   ? Alkaline Phosphatase 34 (*)   ? All other components within normal limits  ?CBC - Abnormal; Notable for the following components:  ? RBC 2.56 (*)   ?  Hemoglobin 8.4 (*)   ? HCT 24.8 (*)   ? All other components within normal limits  ?POC OCCULT BLOOD, ED - Abnormal; Notable for the following components:  ? Fecal Occult Bld POSITIVE (*)   ? All other components within normal limits  ?RESP PANEL BY RT-PCR (FLU A&B, COVID) ARPGX2  ?HIV ANTIBODY (ROUTINE TESTING W REFLEX)  ?HEMOGLOBIN AND HEMATOCRIT, BLOOD  ?HEMOGLOBIN AND HEMATOCRIT, BLOOD  ?PROTIME-INR  ?TYPE AND SCREEN  ?ABO/RH  ? ? ?EKG ?None ? ?Radiology ?No results found. ? ?Procedures ?Procedures  ? ? ?Medications Ordered in ED ?Medications  ?acetaminophen (TYLENOL) tablet 650 mg (has no administration in time range)  ?  Or  ?acetaminophen (TYLENOL) suppository 650 mg (has no administration in time range)  ?ondansetron (ZOFRAN) tablet 4 mg (has no administration in time range)  ?  Or  ?ondansetron (ZOFRAN) injection 4 mg (has no administration in time range)  ?insulin aspart (novoLOG) injection 0-9 Units (has no administration in time range)  ?lactated ringers infusion (has no administration in time range)  ?pantoprazole (PROTONIX) injection 40 mg (has no administration in time range)  ?ALPRAZolam (XANAX) tablet 1 mg (has no administration in time range)  ?multivitamin with minerals tablet 1 tablet (has no administration in time range)  ?sertraline (ZOLOFT) tablet 50 mg (has no administration in time range)  ?pantoprazole (PROTONIX) injection 40 mg (40 mg Intravenous Given 04/14/21 1333)  ?sodium chloride 0.9 % bolus 1,000 mL (0 mLs Intravenous Stopped 04/14/21 1430)  ?potassium chloride SA (KLOR-CON M) CR tablet 40 mEq (40 mEq Oral Given 04/14/21 1429)  ? ? ?ED Course/ Medical Decision Making/ A&P ?  ?                        ?Medical Decision Making ?Amount and/or Complexity of Data Reviewed ?Labs: ordered. ? ?Risk ?Prescription drug management. ?Decision regarding hospitalization. ? ? ?This patient presents to the ED for concern of rectal bleeding, this involves an extensive number of treatment options, and is  a complaint that carries with it a high risk of complications and morbidity.  The differential diagnosis includes but is not limited to hemorrhoids, diverticulosis, malignancy, peptic ulcer disease, gastritis, variceal bleeding  ? ?Comorbidities that complicate the patient evaluation: ?Patient?s presentation is complicated by their history of htn, hld, dm, bph ? ?Social Determinants of Health: ?Patient?s  noncompliance with medical therapy   increases the complexity of managing their presentation ? ?Additional history obtained: ?Records reviewed Care Everywhere/External  Records ? ?Lab Tests: ?I Ordered, and personally interpreted labs.  The pertinent results include:   ?CBC with new anemia ?CMP with elevated bun, mild hypokalemia ?Hemoccult positive ? ?Cardiac Monitoring: ?The patient was maintained on a cardiac monitor.  I personally viewed and interpreted the cardiac monitor which showed an underlying rhythm of:  sinus rhythm ? ?Medicines ordered and prescription drug management: ?I ordered medication including ppi, ivf  for gi bleed  ?Reevaluation of the patient after these medicines showed that the patient    stayed the same ? ?Critical Interventions: ppi ? ?Complexity of problems addressed: ?Patient?s presentation is most consistent with  acute presentation with potential threat to life or bodily function ? ?Disposition: ?After consideration of the diagnostic results and the patient?s response to treatment,  ?I feel that the patent would benefit from admission for further tx of GI bleed with new symptomatic anemia .  ? ?Consultations Obtained: ?1:54 PM I consulted with the admitting physician Dr. Lupita Leash, and discussed  findings as well as pertinent plan. He accepts patient for admission. Recommends GI consult.  ? ?[place] [place] [facility] consult completed. Amy Trellis Paganini was made aware of patient via secure chat.  ? ?Final Clinical Impression(s) / ED Diagnoses ?Final diagnoses:  ?Gastrointestinal hemorrhage, unspecified  gastrointestinal hemorrhage type  ? ? ?Rx / DC Orders ?ED Discharge Orders   ? ? None  ? ?  ? ? ?  ?Rodney Booze, PA-C ?04/14/21 1516 ? ?  ?Rodney Booze, PA-C ?04/14/21 1516 ? ?  ?Deno Etienne, DO ?04/14/21 1532 ? ?

## 2021-04-15 ENCOUNTER — Inpatient Hospital Stay (HOSPITAL_COMMUNITY): Payer: Managed Care, Other (non HMO) | Admitting: Certified Registered Nurse Anesthetist

## 2021-04-15 ENCOUNTER — Encounter (HOSPITAL_COMMUNITY)
Admission: EM | Disposition: A | Discharge status: Home / Self Care | Payer: Self-pay | Source: Home / Self Care | Attending: Internal Medicine

## 2021-04-15 ENCOUNTER — Encounter (HOSPITAL_COMMUNITY): Payer: Self-pay | Admitting: Internal Medicine

## 2021-04-15 DIAGNOSIS — Z8673 Personal history of transient ischemic attack (TIA), and cerebral infarction without residual deficits: Secondary | ICD-10-CM | POA: Diagnosis not present

## 2021-04-15 DIAGNOSIS — K2101 Gastro-esophageal reflux disease with esophagitis, with bleeding: Secondary | ICD-10-CM

## 2021-04-15 DIAGNOSIS — D125 Benign neoplasm of sigmoid colon: Secondary | ICD-10-CM

## 2021-04-15 DIAGNOSIS — K5731 Diverticulosis of large intestine without perforation or abscess with bleeding: Secondary | ICD-10-CM | POA: Diagnosis not present

## 2021-04-15 DIAGNOSIS — K222 Esophageal obstruction: Secondary | ICD-10-CM

## 2021-04-15 DIAGNOSIS — E876 Hypokalemia: Secondary | ICD-10-CM | POA: Diagnosis not present

## 2021-04-15 DIAGNOSIS — K2100 Gastro-esophageal reflux disease with esophagitis, without bleeding: Secondary | ICD-10-CM

## 2021-04-15 DIAGNOSIS — D62 Acute posthemorrhagic anemia: Secondary | ICD-10-CM

## 2021-04-15 DIAGNOSIS — K635 Polyp of colon: Secondary | ICD-10-CM

## 2021-04-15 LAB — GLUCOSE, CAPILLARY
Glucose-Capillary: 96 mg/dL (ref 70–99)
Glucose-Capillary: 76 mg/dL (ref 70–99)
Glucose-Capillary: 75 mg/dL (ref 70–99)

## 2021-04-15 LAB — BASIC METABOLIC PANEL
Anion gap: 9 (ref 5–15)
BUN: 14 mg/dL (ref 8–23)
CO2: 26 mmol/L (ref 22–32)
Calcium: 8.4 mg/dL — ABNORMAL LOW (ref 8.9–10.3)
Chloride: 106 mmol/L (ref 98–111)
Creatinine, Ser: 0.7 mg/dL (ref 0.61–1.24)
GFR, Estimated: >60 mL/min (ref >60)
Glucose, Bld: 99 mg/dL (ref 70–99)
Potassium: 3.9 mmol/L (ref 3.5–5.1)
Sodium: 141 mmol/L (ref 135–145)

## 2021-04-15 LAB — HEMOGLOBIN AND HEMATOCRIT, BLOOD
HCT: 19.8 % — ABNORMAL LOW (ref 39.0–52.0)
HCT: 23.9 % — ABNORMAL LOW (ref 39.0–52.0)
Hemoglobin: 6.9 g/dL — CL (ref 13.0–17.0)
Hemoglobin: 8.2 g/dL — ABNORMAL LOW (ref 13.0–17.0)

## 2021-04-15 LAB — PREPARE RBC (CROSSMATCH)

## 2021-04-15 SURGERY — COLONOSCOPY WITH PROPOFOL
Anesthesia: Monitor Anesthesia Care

## 2021-04-15 MED ORDER — PROPOFOL 10 MG/ML IV BOLUS
INTRAVENOUS | Status: DC | PRN
  Administered 2021-04-15 (×2): 30 mg via INTRAVENOUS

## 2021-04-15 MED ORDER — SODIUM CHLORIDE 0.9% IV SOLUTION: Freq: Once | INTRAVENOUS | Status: AC

## 2021-04-15 MED ORDER — DEXMEDETOMIDINE (PRECEDEX) IN NS 20 MCG/5ML (4 MCG/ML) IV SYRINGE
PREFILLED_SYRINGE | INTRAVENOUS | Status: DC | PRN
  Administered 2021-04-15: 8 ug via INTRAVENOUS

## 2021-04-15 MED ORDER — DEXMEDETOMIDINE (PRECEDEX) IN NS 20 MCG/5ML (4 MCG/ML) IV SYRINGE
PREFILLED_SYRINGE | INTRAVENOUS | Status: AC
  Filled 2021-04-15: qty 5

## 2021-04-15 MED ORDER — LIDOCAINE 2% (20 MG/ML) 5 ML SYRINGE
INTRAMUSCULAR | Status: DC | PRN
Start: 2021-04-15 — End: 2021-04-15
  Administered 2021-04-15: 80 mg via INTRAVENOUS

## 2021-04-15 MED ORDER — PROPOFOL 500 MG/50ML IV EMUL
INTRAVENOUS | Status: DC | PRN
Start: 2021-04-15 — End: 2021-04-15
  Administered 2021-04-15: 125 ug/kg/min via INTRAVENOUS

## 2021-04-15 MED ORDER — CLOPIDOGREL BISULFATE 75 MG PO TABS: 75.0000 mg | ORAL_TABLET | Freq: Every day | ORAL | 1 refills | Status: AC

## 2021-04-15 SURGICAL SUPPLY — 25 items

## 2021-04-15 NOTE — Discharge Summary (Addendum)
?Physician Discharge Summary ?  ?Patient: Darrell [name] MRN: [id number] DOB: [birth year]  ?Admit date:     04/14/2021  ?Discharge date: 04/15/21  ?Discharge Physician: Oswald Hillock  ? ?PCP: Jilda Panda, MD  ? ?Recommendations at discharge:  ? ?Follow-up PCP in 1 week ? ?Discharge Diagnoses: ?Principal Problem: ?  Acute blood loss anemia ?Active Problems: ?  Hypertension ?  Hyperlipidemia ?  Hypokalemia ?  Type 2 diabetes mellitus with hyperlipidemia ([place]) ?  Benign prostatic hyperplasia without lower urinary tract symptoms ?  Hx of transient ischemic attack (TIA) ?  GI bleeding ?  Diverticulosis of colon with hemorrhage ?  Benign neoplasm of sigmoid colon ?  Esophageal stricture ?  Gastroesophageal reflux disease with esophagitis without hemorrhage ? ?Resolved Problems: ?  * No resolved hospital problems. * ? ?Hospital Course: ? 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  Onset about 4 days ago noticing dark red blood in the stool multiple episodes and that have largely subsided apparently had episode of syncope in the beginning.  Also complains nausea vomiting but no hematemesis.  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 ?In the ED, vitals stable, labs with potassium 3.3, hemoglobin 8.4 g previous baseline in June/2020 was 12 gm.  He was given Protonix and admission was requested for further management. ? ? ?Assessment and Plan: ? ?GI bleeding ?-Patient presented with 4 days of rectal bleeding while taking Eliquis.  Last dose of Eliquis was 5 days prior to admission.  Baseline  hemoglobin around 12. ?-Patient received 2 units PRBC ?-Underwent EGD and colonoscopy ?-Likely diverticular bleed as per GI ?-Also seen having reflux esophagitis and esophageal stricture on EGD ?-GI recommends taking Protonix 40 mg daily indefinitely ? ?History of TIA/stroke ?-Patient was taking Plavix since 2020 ?-Recently ran out of Plavix so started taking Eliquis a month ago ?-I have told patient not to take Eliquis without physicians guidance ?-We will give him prescription for Plavix 75 mg daily ?-Patient to follow-up with PCP ? ?Diabetes mellitus type 2 ?-Continue home medications ? ?Hypokalemia ?-Replete ? ? ? ?  ? ? ?Consultants: Gastroenterology ?Procedures performed: EGD and colonoscopy ?Disposition: Home ?Diet recommendation:  ?Discharge Diet Orders (From admission, onward)  ? ?  Start     Ordered  ? 04/15/21 0000  Diet - low sodium heart healthy       ? 04/15/21 1525  ? ?  ?  ? ?  ? ?Regular diet ?DISCHARGE MEDICATION: ?Allergies as of 04/15/2021   ?No Known Allergies ?  ? ?  ?Medication List  ?  ? ?TAKE these medications   ? ?ALPRAZolam 1 MG tablet ?Commonly known as: Duanne Moron ?Take 1 mg by mouth daily. ?  ?clopidogrel 75 MG tablet ?Commonly known as: Plavix ?Take 1 tablet (75 mg total) by mouth daily. ?  ?multivitamin tablet ?Take 1 tablet by mouth daily. ?  ?potassium chloride 10 MEQ tablet ?Commonly known as: KLOR-CON M ?Take 10 mEq by mouth daily. ?  ?Rybelsus 7 MG Tabs ?Generic drug: Semaglutide ?Take 7 mg by mouth daily. ?  ?sertraline  50 MG tablet ?Commonly known as: ZOLOFT ?Take 50 mg by mouth daily. ?  ?Synjardy XR 25-1000 MG Tb24 ?Generic drug: Empagliflozin-metFORMIN HCl ER ?Take 1 tablet by mouth daily. ?  ?Vitamin D (Ergocalciferol) 50000 units Caps ?Take 50,000 Units by mouth once a week. Saturday's ?  ? ?  ? ? ?Discharge Exam: ?Filed Weights  ? 04/14/21 1217 04/15/21 1221  ?Weight: 64 kg 64 kg  ? ?General-appears in no acute distress ?Heart-S1-S2, regular, no murmur auscultated ?Lungs-clear  to auscultation bilaterally, no wheezing or crackles auscultated ?Abdomen-soft, nontender, no organomegaly ?Extremities-no edema in the lower extremities ?Neuro-alert, oriented x3, no focal deficit noted ? ?Condition at discharge: good ? ?The results of significant diagnostics from this hospitalization (including imaging, microbiology, ancillary and laboratory) are listed below for reference.  ? ?Imaging Studies: ?No results found. ? ?Microbiology: ?Results for orders placed or performed during the hospital encounter of 04/14/21  ?Resp Panel by RT-PCR (Flu A&B, Covid) Nasopharyngeal Swab     Status: None  ? Collection Time: 04/14/21  1:30 PM  ? Specimen: Nasopharyngeal Swab; Nasopharyngeal(NP) swabs in vial transport medium  ?Result Value Ref Range Status  ? SARS Coronavirus 2 by RT PCR NEGATIVE NEGATIVE Final  ?  Comment: (NOTE) ?SARS-CoV-2 target nucleic acids are NOT DETECTED. ? ?The SARS-CoV-2 RNA is generally detectable in upper respiratory ?specimens during the acute phase of infection. The lowest ?concentration of SARS-CoV-2 viral copies this assay can detect is ?138 copies/mL. A negative result does not preclude SARS-Cov-2 ?infection and should not be used as the sole basis for treatment or ?other patient management decisions. A negative result may occur with  ?improper specimen collection/handling, submission of specimen other ?than nasopharyngeal swab, presence of viral mutation(s) within the ?areas targeted by this assay, and inadequate number of viral ?copies(<138 copies/mL). A negative result must be combined with ?clinical observations, patient history, and epidemiological ?information. The expected result is Negative. ? ?Fact Sheet for Patients:  ?EntrepreneurPulse.com.au ? ?Fact Sheet for Healthcare Providers:  ?IncredibleEmployment.be ? ?This test is no t yet approved or cleared by the [place] FDA and  ?has been authorized for detection and/or diagnosis of  SARS-CoV-2 by ?FDA under an Emergency Use Authorization (EUA). This EUA will remain  ?in effect (meaning this test can be used) for the duration of the ?COVID-19 declaration under Section 564(b)(1) of the Act, 21 ?U.S.C.section 360bbb-3(b)(1), unless the authorization is terminated  ?or revoked sooner.  ? ? ?  ? Influenza A by PCR NEGATIVE NEGATIVE Final  ? Influenza B by PCR NEGATIVE NEGATIVE Final  ?  Comment: (NOTE) ?The Xpert Xpress SARS-CoV-2/FLU/RSV plus assay is intended as an aid ?in the diagnosis of influenza from Nasopharyngeal swab specimens and ?should not be used as a sole basis for treatment. Nasal washings and ?aspirates are unacceptable for Xpert Xpress SARS-CoV-2/FLU/RSV ?testing. ? ?Fact Sheet for Patients: ?EntrepreneurPulse.com.au ? ?Fact Sheet for Healthcare Providers: ?IncredibleEmployment.be ? ?This test is not yet approved or cleared by the [place] FDA and ?has been authorized for detection and/or diagnosis of SARS-CoV-2 by ?FDA under an Emergency Use Authorization (EUA). This EUA will remain ?in effect (meaning this test can be used) for the duration of the ?COVID-19 declaration under Section 564(b)(1) of the Act, 21 U.S.C. ?section 360bbb-3(b)(1), unless the authorization is terminated or ?revoked. ? ?Performed at [facility], [address] ?[place], [place] [address] ?  ? ? ?Labs: ?CBC: ?Recent Labs  ?Lab 04/14/21 ?1232 04/15/21 ?0311 04/15/21 ?1036  ?WBC 10.2  --   --   ?  HGB 8.4* 6.9* 8.2*  ?HCT 24.8* 19.8* 23.9*  ?MCV 96.9  --   --   ?PLT 199  --   --   ? ?Basic Metabolic Panel: ?Recent Labs  ?Lab 04/14/21 ?1232 04/15/21 ?0311  ?NA 140 141  ?K 3.3* 3.9  ?CL 102 106  ?CO2 26 26  ?GLUCOSE 137* 99  ?BUN 29* 14  ?CREATININE 0.91 0.70  ?CALCIUM 9.1 8.4*  ? ?Liver Function Tests: ?Recent Labs  ?Lab 04/14/21 ?1232  ?AST 20  ?ALT 13  ?ALKPHOS 34*  ?BILITOT 1.0  ?PROT 7.1  ?ALBUMIN 4.4  ? ?CBG: ?Recent Labs  ?Lab 04/14/21 ?1630  04/14/21 ?2144 04/15/21 ?0711 04/15/21 ?1108 04/15/21 ?1250  ?GLUCAP 95 80 96 76 75  ? ? ?Discharge time spent: greater than 30 minutes. ? ?Signed: ?Oswald Hillock, MD ?[facility] ?04/15/2021 ?

## 2021-04-15 NOTE — Progress Notes (Signed)
?  Transition of Care (TOC) Screening Note ? ? ?Patient Details  ?Name: Darrell [name] ?Date of Birth: Dec 22, 1955 ? ? ?Transition of Care [facility]) CM/SW Contact:    ?Dessa Phi, RN ?Phone Number: ?04/15/2021, 10:08 AM ? ? ? ?Transition of Care Department [facility]) has reviewed patient and no TOC needs have been identified at this time. We will continue to monitor patient advancement through interdisciplinary progression rounds. If new patient transition needs arise, please place a TOC consult. ?  ?

## 2021-04-15 NOTE — Anesthesia Preprocedure Evaluation (Addendum)
Anesthesia Evaluation  ?Patient identified by MRN, date of birth, ID band ?Patient awake ? ? ? ?Reviewed: ?Allergy & Precautions, NPO status , Patient's Chart, lab work & pertinent test results ? ?History of Anesthesia Complications ?Negative for: history of anesthetic complications ? ?Airway ?Mallampati: III ? ?TM Distance: >3 FB ?Neck ROM: Full ? ? ? Dental ?no notable dental hx. ?(+) Dental Advisory Given ?  ?Pulmonary ?neg pulmonary ROS,  ?  ?Pulmonary exam normal ? ? ? ? ? ? ? Cardiovascular ?hypertension, Normal cardiovascular exam ? ? ?  ?Neuro/Psych ?TIA  ? GI/Hepatic ?negative GI ROS, Neg liver ROS,   ?Endo/Other  ?diabetes ? Renal/GU ?negative Renal ROS  ? ?  ?Musculoskeletal ?negative musculoskeletal ROS ?(+)  ? Abdominal ?  ?Peds ? Hematology ? ?(+) Blood dyscrasia, anemia ,   ?Anesthesia Other Findings ? ? Reproductive/Obstetrics ? ?  ? ? ? ? ? ? ? ? ? ? ? ? ? ?  ?  ? ? ? ? ? ? ? ?Anesthesia Physical ?Anesthesia Plan ? ?ASA: 3 ? ?Anesthesia Plan: MAC  ? ?Post-op Pain Management: Minimal or no pain anticipated  ? ?Induction:  ? ?PONV Risk Score and Plan: Propofol infusion ? ?Airway Management Planned: Natural Airway ? ?Additional Equipment:  ? ?Intra-op Plan:  ? ?Post-operative Plan:  ? ?Informed Consent: I have reviewed the patients History and Physical, chart, labs and discussed the procedure including the risks, benefits and alternatives for the proposed anesthesia with the patient or authorized representative who has indicated his/her understanding and acceptance.  ? ? ? ?Dental advisory given ? ?Plan Discussed with: Anesthesiologist and CRNA ? ?Anesthesia Plan Comments:   ? ? ? ? ? ?Anesthesia Quick Evaluation ? ?

## 2021-04-15 NOTE — Progress Notes (Signed)
Pt given and explained discharge instructions. IV removed. Tele removed. Pt dressed in personal clothing and taken to the main entrance via wheelchair.   ?

## 2021-04-15 NOTE — Progress Notes (Addendum)
Patient ID: Darrell [name], male   DOB: 05-04-1955, 67 y.o.   MRN: [id number] ? ? ? Progress Note ? ? Subjective  ? Day # 2 ? CC; GI bleed ? ?Hgb 8.4>6.9 this am - has received one unit this am ? ?Patient tolerated prep without difficulty, no active bleeding with prep ?Patient says he feels fine, hungry no abdominal pain or cramping ? ? Objective  ? ?Vital signs in last 24 hours: ?Temp:  [97.8 ?F (36.6 ?C)-99 ?F (37.2 ?C)] 98.2 ?F (36.8 ?C) (03/15 6195) ?Pulse Rate:  [59-84] 71 (03/15 0656) ?Resp:  [16-34] 18 (03/15 0656) ?BP: (136-171)/(75-98) 157/94 (03/15 0656) ?SpO2:  [96 %-100 %] 100 % (03/15 0656) ?Weight:  [64 kg] 64 kg (03/14 1217) ?Last BM Date : 04/15/21 ?General:    AA male in NAD ?Heart:  Regular rate and rhythm; no murmurs ?Lungs: Respirations even and unlabored, lungs CTA bilaterally ?Abdomen:  Soft, nontender and nondistended. Normal bowel sounds. ?Extremities:  Without edema. ?Neurologic:  Alert and oriented,  grossly normal neurologically. ?Psych:  Cooperative. Normal mood and affect. ? ?Intake/Output from previous day: ?03/14 0701 - 03/15 0700 ?In: 1642.8 [I.V.:693.8; IV Piggyback:949.1] ?Out: 1150 [Urine:1150] ?Intake/Output this shift: ?No intake/output data recorded. ? ?Lab Results: ?Recent Labs  ?  04/14/21 ?1232 04/15/21 ?0311  ?WBC 10.2  --   ?HGB 8.4* 6.9*  ?HCT 24.8* 19.8*  ?PLT 199  --   ? ?BMET ?Recent Labs  ?  04/14/21 ?1232 04/15/21 ?0311  ?NA 140 141  ?K 3.3* 3.9  ?CL 102 106  ?CO2 26 26  ?GLUCOSE 137* 99  ?BUN 29* 14  ?CREATININE 0.91 0.70  ?CALCIUM 9.1 8.4*  ? ?LFT ?Recent Labs  ?  04/14/21 ?1232  ?PROT 7.1  ?ALBUMIN 4.4  ?AST 20  ?ALT 13  ?ALKPHOS 34*  ?BILITOT 1.0  ? ?PT/INR ?Recent Labs  ?  04/14/21 ?1536  ?LABPROT 14.6  ?INR 1.1  ? ? ? ? ? ? ? Assessment / Plan:   ? ?#58 66 year old male admitted with acute GI bleed, onset 04/11/2021 with multiple episodes of painless bright red blood per rectum.  Bleeding occurred in setting of Eliquis use-last Eliquis dose 04/11/2021 ? ?Hemoglobin  dropped further last evening but no obvious active bleeding with prep ?Transfused 1 unit this a.m. follow-up hemoglobin pending ? ?Remains hemodynamically stable ? ?Etiology of bleed suspected to be diverticular, however cannot rule out neoplasm, AVMs or less likely upper GI source ? ?#2 anemia, normocytic acute secondary to acute blood loss ?#3 history of prior TIA/small vessel disease on MRI had been on chronic antiplatelet-prescribed Plavix. ?Patient had run out about a month ago and decided to substitute with someone else's Eliquis. ? ?#4 hypertension ?5.  Adult onset diabetes mellitus ? ?Plan; proceed with EGD and colonoscopy earlier this afternoon ?Continue to trend hemoglobin and transfuse as indicated. ? ? ? ? ? ?Principal Problem: ?  Acute blood loss anemia ?Active Problems: ?  Hypertension ?  Hyperlipidemia ?  Hypokalemia ?  Type 2 diabetes mellitus with hyperlipidemia ([name]) ?  Benign prostatic hyperplasia without lower urinary tract symptoms ?  Hx of transient ischemic attack (TIA) ?  GI bleeding ? ? ? ? LOS: 1 day  ? ?Amy Esterwood PA-C 04/15/2021, 8:30 AM ? ?GI ATTENDING ? ?Interval history data reviewed.  Patient seen and examined.  Agree with interval progress note.  The patient's bleeding seems to have stopped.  He was transfused for hemoglobin 6.9.  Hemodynamically stable.  For colonoscopy  this afternoon. ? ?Docia Chuck. Geri Seminole., M.D. ?[place] ?Division of Gastroenterology  ?  ?

## 2021-04-15 NOTE — Anesthesia Postprocedure Evaluation (Signed)
Anesthesia Post Note ? ?Patient: Darrell [name] ? ?Procedure(s) Performed: COLONOSCOPY WITH PROPOFOL ?ESOPHAGOGASTRODUODENOSCOPY (EGD) WITH PROPOFOL ?POLYPECTOMY ? ?  ? ?Patient location during evaluation: PACU ?Anesthesia Type: MAC ?Level of consciousness: awake and alert ?Pain management: pain level controlled ?Vital Signs Assessment: post-procedure vital signs reviewed and stable ?Respiratory status: spontaneous breathing and respiratory function stable ?Cardiovascular status: stable ?Postop Assessment: no apparent nausea or vomiting ?Anesthetic complications: no ? ? ?No notable events documented. ? ?Last Vitals:  ?Vitals:  ? 04/15/21 1442 04/15/21 1443  ?BP:  (!) 157/72  ?Pulse: 61 62  ?Resp: 17 19  ?Temp:    ?SpO2: 100% 100%  ?  ?Last Pain:  ?Vitals:  ? 04/15/21 1413  ?TempSrc: Temporal  ?PainSc: 0-No pain  ? ? ?  ?  ?  ?  ?  ?  ? ?Darrell [name] ? ? ? ? ?

## 2021-04-15 NOTE — Op Note (Signed)
[facility] ?Patient Name: Darrell [name] ?Procedure Date: 04/15/2021 ?MRN: [id number] ?Attending MD: Docia Chuck. Henrene Pastor , MD ?Date of Birth: 06-09-1955 ?CSN: [id number] ?Age: 66 ?Admit Type: Outpatient ?Procedure:                Upper GI endoscopy ?Indications:              Acute post hemorrhagic anemia, Hematochezia ?Providers:                Docia Chuck. Henrene Pastor, MD, Kary Kos RN, RN, Charlean Merl  ?                          Purcell Nails, Technician, Christell Faith, CRNA ?Referring MD:             [facility] hospitalist ?Medicines:                Monitored Anesthesia Care ?Complications:            No immediate complications. ?Estimated Blood Loss:     Estimated blood loss: none. ?Procedure:                Pre-Anesthesia Assessment: ?                          - Prior to the procedure, a History and Physical  ?                          was performed, and patient medications and  ?                          allergies were reviewed. The patient's tolerance of  ?                          previous anesthesia was also reviewed. The risks  ?                          and benefits of the procedure and the sedation  ?                          options and risks were discussed with the patient.  ?                          All questions were answered, and informed consent  ?                          was obtained. Prior Anticoagulants: The patient has  ?                          taken Eliquis (apixaban), last dose was 3 days  ?                          prior to procedure. ASA Grade Assessment: III - A  ?                          patient with severe systemic disease. After  ?  reviewing the risks and benefits, the patient was  ?                          deemed in satisfactory condition to undergo the  ?                          procedure. ?                          After obtaining informed consent, the endoscope was  ?                          passed under direct vision. Throughout the  ?                           procedure, the patient's blood pressure, pulse, and  ?                          oxygen saturations were monitored continuously. The  ?                          [id number]-[id number] ([id number]) Olympus endoscope was introduced  ?                          through the mouth, and advanced to the second part  ?                          of duodenum. The upper GI endoscopy was  ?                          accomplished without difficulty. The patient  ?                          tolerated the procedure well. ?Scope In: ?Scope Out: ?Findings: ?     One benign-appearing, intrinsic moderate stenosis was found at the  ?     gastroesophageal junction. This stenosis measured 1.5 cm (inner  ?     diameter) and was consistent with a peptic stricture. ?     The esophagus revealed associated esophagitis at the level of the  ?     stricture as manifested by edema and mild friability. No Barrett's. The  ?     esophagus was otherwise normal. ?     The stomach was normal save small hiatal hernia. ?     The examined duodenum was normal. ?     The cardia and gastric fundus were normal on retroflexion. ?Impression:               1. Reflux esophagitis and esophageal stricture. ?                          2. Otherwise unremarkable EGD ?                          3. No blood or active bleeding ?  4. The patient is felt to have had a self-limited  ?                          diverticular bleed has resolved. He is clinically  ?                          stable. ?Moderate Sedation: ?     none ?Recommendation:           1. Pantoprazole 40 mg daily, indefinitely ?                          2. Regular diet ?                          3. Okay to resume antiplatelet or anticoagulation  ?                          therapy, if felt to be clinically important ?                          4. Okay for discharge from GI standpoint (assuming  ?                          the patient has transportation as he received  ?                          sedation  today). ?                          5. No specific GI required ?                          The procedures were discussed with the patient by  ?                          myself. He was also provided copies of each  ?                          procedure report. ?                          We will sign off ?Procedure Code(s):        --- Professional --- ?                          [phone], Esophagogastroduodenoscopy, flexible,  ?                          transoral; diagnostic, including collection of  ?                          specimen(s) by brushing or washing, when performed  ?                          (separate procedure) ?Diagnosis Code(s):        --- Professional --- ?  K22.2, Esophageal obstruction ?                          D62, Acute posthemorrhagic anemia ?                          K92.1, Melena (includes Hematochezia) ?CPT copyright 2019 American Medical Association. All rights reserved. ?The codes documented in this report are preliminary and upon coder review may  ?be revised to meet current compliance requirements. ?Docia Chuck. Henrene Pastor, MD ?04/15/2021 2:16:48 PM ?This report has been signed electronically. ?Number of Addenda: 0 ?

## 2021-04-15 NOTE — Anesthesia Procedure Notes (Signed)
Procedure Name: [place] ?Date/Time: 04/15/2021 1:14 PM ?Performed by: West Pugh, CRNA ?Pre-anesthesia Checklist: Patient identified, Emergency Drugs available, Suction available, Patient being monitored and Timeout performed ?Patient Re-evaluated:Patient Re-evaluated prior to induction ?Oxygen Delivery Method: Simple face mask ?Preoxygenation: Pre-oxygenation with 100% oxygen ?Induction Type: IV induction ?Placement Confirmation: positive ETCO2 ?Dental Injury: Teeth and Oropharynx as per pre-operative assessment  ? ? ? ? ?

## 2021-04-15 NOTE — Op Note (Signed)
[facility] ?Patient Name: Darrell [name] ?Procedure Date: 04/15/2021 ?MRN: [id number] ?Attending MD: Docia Chuck. Henrene Pastor , MD ?Date of Birth: December 20, 1955 ?CSN: [id number] ?Age: 66 ?Admit Type: Outpatient ?Procedure:                Colonoscopy with cold snare polypectomy x 1 ?Indications:              Hematochezia ?Providers:                Docia Chuck. Henrene Pastor, MD, Kary Kos RN, RN, Charlean Merl  ?                          Purcell Nails, Technician, Christell Faith, CRNA ?Referring MD:              ?Medicines:                Monitored Anesthesia Care ?Complications:            No immediate complications. Estimated blood loss:  ?                          None. ?Estimated Blood Loss:     Estimated blood loss: none. ?Procedure:                Pre-Anesthesia Assessment: ?                          - Prior to the procedure, a History and Physical  ?                          was performed, and patient medications and  ?                          allergies were reviewed. The patient's tolerance of  ?                          previous anesthesia was also reviewed. The risks  ?                          and benefits of the procedure and the sedation  ?                          options and risks were discussed with the patient.  ?                          All questions were answered, and informed consent  ?                          was obtained. Prior Anticoagulants: The patient has  ?                          taken Eliquis (apixaban), last dose was 3 days  ?                          prior to procedure. ASA Grade Assessment: III - A  ?  patient with severe systemic disease. After  ?                          reviewing the risks and benefits, the patient was  ?                          deemed in satisfactory condition to undergo the  ?                          procedure. ?                          After obtaining informed consent, the colonoscope  ?                          was passed under direct vision. Throughout  the  ?                          procedure, the patient's blood pressure, pulse, and  ?                          oxygen saturations were monitored continuously. The  ?                          [id number]-HQ190L ([id number]) Olympus colonoscope was  ?                          introduced through the anus and advanced to the the  ?                          cecum, identified by appendiceal orifice and  ?                          ileocecal valve. The ileocecal valve, appendiceal  ?                          orifice, and rectum were photographed. The quality  ?                          of the bowel preparation was excellent. The  ?                          colonoscopy was performed without difficulty. The  ?                          patient tolerated the procedure well. The bowel  ?                          preparation used was SUPREP via split dose  ?                          instruction. ?Scope In: 1:33:45 PM ?Scope Out: 1:50:20 PM ?Scope Withdrawal Time: 0 hours 12 minutes 25 seconds  ?Total Procedure Duration: 0 hours 16 minutes 35 seconds  ?Findings: ?  A 4 mm polyp was found in the sigmoid colon. The polyp was removed with  ?     a cold snare. Resection and retrieval were complete. ?     Multiple diverticula were found in the sigmoid colon. ?     The exam was otherwise without abnormality on direct and retroflexion  ?     views. There was no bleeding or blood in the colon. ?Impression:               - One 4 mm polyp in the sigmoid colon, removed with  ?                          a cold snare. Resected and retrieved. ?                          - Diverticulosis in the sigmoid colon. ?                          - The examination was otherwise normal on direct  ?                          and retroflexion views. ?                          - No active bleeding or blood in the colon ?Moderate Sedation: ?     none ?Recommendation:           1. Repeat colonoscopy in 10 years for surveillance. ?                          2. Await  pathology results. ?                          3. EGD TODAY. PLEASE SEE EGD REPORT for findings  ?                          and final recommendations ?Procedure Code(s):        --- Professional --- ?                          [phone], Colonoscopy, flexible; with removal of  ?                          tumor(s), polyp(s), or other lesion(s) by snare  ?                          technique ?Diagnosis Code(s):        --- Professional --- ?                          K63.5, Polyp of colon ?                          K92.1, Melena (includes Hematochezia) ?                          K57.30, Diverticulosis of large intestine without  ?  perforation or abscess without bleeding ?CPT copyright 2019 American Medical Association. All rights reserved. ?The codes documented in this report are preliminary and upon coder review may  ?be revised to meet current compliance requirements. ?Docia Chuck. Henrene Pastor, MD ?04/15/2021 1:58:20 PM ?This report has been signed electronically. ?Number of Addenda: 0 ?

## 2021-04-15 NOTE — Transfer of Care (Signed)
Immediate Anesthesia Transfer of Care Note ? ?Patient: Darrell [name] ? ?Procedure(s) Performed: COLONOSCOPY WITH PROPOFOL ?ESOPHAGOGASTRODUODENOSCOPY (EGD) WITH PROPOFOL ?POLYPECTOMY ? ?Patient Location: PACU and Endoscopy Unit ? ?Anesthesia Type:MAC ? ?Level of Consciousness: drowsy and patient cooperative ? ?Airway & Oxygen Therapy: Patient Spontanous Breathing and Patient connected to face mask oxygen ? ?Post-op Assessment: Report given to RN and Post -op Vital signs reviewed and stable ? ?Post vital signs: Reviewed and stable ? ?Last Vitals:  ?Vitals Value Taken Time  ?BP    ?Temp    ?Pulse    ?Resp    ?SpO2    ? ? ?Last Pain:  ?Vitals:  ? 04/15/21 1221  ?TempSrc: Oral  ?PainSc: 0-No pain  ?   ? ?  ? ?Complications: No notable events documented. ?

## 2021-04-16 ENCOUNTER — Encounter (HOSPITAL_COMMUNITY): Payer: Self-pay | Admitting: Internal Medicine

## 2021-04-16 LAB — SURGICAL PATHOLOGY

## 2021-04-16 LAB — TYPE AND SCREEN
ABO/RH(D): AB POS
Antibody Screen: NEGATIVE
Unit division: 0

## 2021-04-16 LAB — BPAM RBC
Blood Product Expiration Date: 202304052359
ISSUE DATE / TIME: 202303150633
Unit Type and Rh: 6200

## 2021-04-22 ENCOUNTER — Ambulatory Visit: Payer: Managed Care, Other (non HMO) | Admitting: Gastroenterology

## 2021-05-27 ENCOUNTER — Ambulatory Visit: Payer: Managed Care, Other (non HMO) | Admitting: Internal Medicine

## 2021-07-01 ENCOUNTER — Ambulatory Visit (INDEPENDENT_AMBULATORY_CARE_PROVIDER_SITE_OTHER): Payer: Managed Care, Other (non HMO) | Admitting: Internal Medicine

## 2021-07-01 ENCOUNTER — Encounter: Payer: Self-pay | Admitting: Internal Medicine

## 2021-07-01 ENCOUNTER — Other Ambulatory Visit (INDEPENDENT_AMBULATORY_CARE_PROVIDER_SITE_OTHER): Payer: Managed Care, Other (non HMO)

## 2021-07-01 VITALS — BP 160/90 | HR 61 | Ht 65.0 in | Wt 146.6 lb

## 2021-07-01 DIAGNOSIS — Z8719 Personal history of other diseases of the digestive system: Secondary | ICD-10-CM

## 2021-07-01 DIAGNOSIS — D62 Acute posthemorrhagic anemia: Secondary | ICD-10-CM | POA: Diagnosis not present

## 2021-07-01 DIAGNOSIS — K5731 Diverticulosis of large intestine without perforation or abscess with bleeding: Secondary | ICD-10-CM | POA: Diagnosis not present

## 2021-07-01 LAB — CBC WITH DIFFERENTIAL/PLATELET
Basophils Absolute: 0 10*3/uL (ref 0.0–0.1)
Basophils Relative: 0.7 % (ref 0.0–3.0)
Eosinophils Absolute: 0.1 10*3/uL (ref 0.0–0.7)
Eosinophils Relative: 1.6 % (ref 0.0–5.0)
HCT: 38.3 % — ABNORMAL LOW (ref 39.0–52.0)
Hemoglobin: 12.6 g/dL — ABNORMAL LOW (ref 13.0–17.0)
Lymphocytes Relative: 25 % (ref 12.0–46.0)
Lymphs Abs: 1.6 10*3/uL (ref 0.7–4.0)
MCHC: 32.9 g/dL (ref 30.0–36.0)
MCV: 91.1 fl (ref 78.0–100.0)
Monocytes Absolute: 0.4 10*3/uL (ref 0.1–1.0)
Monocytes Relative: 6.6 % (ref 3.0–12.0)
Neutro Abs: 4.2 10*3/uL (ref 1.4–7.7)
Neutrophils Relative %: 66.1 % (ref 43.0–77.0)
Platelets: 209 10*3/uL (ref 150.0–400.0)
RBC: 4.2 Mil/uL — ABNORMAL LOW (ref 4.22–5.81)
RDW: 14.4 % (ref 11.5–15.5)
WBC: 6.4 10*3/uL (ref 4.0–10.5)

## 2021-07-01 NOTE — Patient Instructions (Signed)
If you are age 64 or older, your body mass index should be between 23-30. Your Body mass index is 24.4 kg/m. If this is out of the aforementioned range listed, please consider follow up with your Primary Care Provider.  If you are age 2 or younger, your body mass index should be between 19-25. Your Body mass index is 24.4 kg/m. If this is out of the aformentioned range listed, please consider follow up with your Primary Care Provider.   ________________________________________________________  The [place] [facility] providers would like to encourage you to use [facility] to communicate with providers for non-urgent requests or questions.  Due to long hold times on the telephone, sending your provider a message by [facility] may be a faster and more efficient way to get a response.  Please allow 48 business hours for a response.  Please remember that this is for non-urgent requests.  _______________________________________________________  Your provider has requested that you go to the basement level for lab work before leaving today. Press "B" on the elevator. The lab is located at the first door on the left as you exit the elevator.  Please follow up as needed

## 2021-07-01 NOTE — Progress Notes (Signed)
HISTORY OF PRESENT ILLNESS:  Darrell [name] is a 66 y.o. male with past medical history as listed below who presents today for post hospital follow-up.  He was admitted with acute GI bleeding.  Previous hemoglobin 14.0.  Hemoglobin during his hospitalization dropped down to 6.9.  Received transfusions.  Underwent colonoscopy and upper endoscopy April 15, 2021.  Most significant finding was diverticulosis without active bleeding.  He was felt to have had a diverticular bleed.  No neoplasia.  Patient states that he has been well since his hospitalization.  He has recovered from fatigue.  No further bleeding.  No GI complaints  REVIEW OF SYSTEMS:  All non-GI ROS negative unless otherwise stated in the HPI  Past Medical History:  Diagnosis Date   Dyslipidemia    Essential hypertension, benign    Hyperlipidemia    Thyroid cyst    Type 2 diabetes mellitus ([place])    Vitamin D deficiency     Past Surgical History:  Procedure Laterality Date   COLONOSCOPY WITH PROPOFOL N/A 04/15/2021   Procedure: COLONOSCOPY WITH PROPOFOL;  Surgeon: Irene Shipper, MD;  Location: [facility];  Service: Gastroenterology;  Laterality: N/A;   ESOPHAGOGASTRODUODENOSCOPY (EGD) WITH PROPOFOL N/A 04/15/2021   Procedure: ESOPHAGOGASTRODUODENOSCOPY (EGD) WITH PROPOFOL;  Surgeon: Irene Shipper, MD;  Location: [facility];  Service: Gastroenterology;  Laterality: N/A;   MELANOMA EXCISION     Back   POLYPECTOMY  04/15/2021   Procedure: POLYPECTOMY;  Surgeon: Irene Shipper, MD;  Location: [facility];  Service: Gastroenterology;;   Arnetha Courser TOOTH EXTRACTION      Social History [name]  reports that he has never smoked. He has never used smokeless tobacco. He reports current alcohol use. He reports that he does not use drugs.  family history includes Alzheimer's disease in his father; Diabetes in his father and maternal grandmother; Hypertension in his father; Pulmonary embolism in his mother.  No Known Allergies      PHYSICAL EXAMINATION: Vital signs: BP (!) 160/90   Pulse 61   Ht '5\' 5"'$  (1.651 m)   Wt 146 lb 9.6 oz (66.5 kg)   SpO2 96%   BMI 24.40 kg/m   Constitutional: generally well-appearing, no acute distress Psychiatric: alert and oriented x3, cooperative Eyes: extraocular movements intact, anicteric, conjunctiva pink Mouth: oral pharynx moist, no lesions Neck: supple no lymphadenopathy Cardiovascular: heart regular rate and rhythm, no murmur Lungs: clear to auscultation bilaterally Abdomen: soft, nontender, nondistended, no obvious ascites, no peritoneal signs, normal bowel sounds, no organomegaly Rectal: Omitted Extremities: no clubbing, cyanosis, or lower extremity edema bilaterally Skin: no lesions on visible extremities Neuro: No focal deficits.  Cranial nerves intact  ASSESSMENT:  1.  Acute diverticular bleed.  Resolved 2.  Acute blood loss anemia   PLAN:  1.  Discussion today on diverticular bleeding 2.  Follow-up CBC today ADDENDUM: Hemoglobin markedly improved at 12.9.  Patient has been informed.  He will return to the care of his PCP. Total time of 30 minutes was spent.  See the patient, reviewing data, taking history, performing medically appropriate physical exam, counseling and educating the patient regarding the above listed issues, ordering blood work, and documenting clinical information in the health record

## 2021-12-23 DIAGNOSIS — Z139 Encounter for screening, unspecified: Secondary | ICD-10-CM

## 2021-12-23 LAB — GLUCOSE, POCT (MANUAL RESULT ENTRY): POC Glucose: 210 mg/dl — AB (ref 70–99)

## 2022-01-04 NOTE — Congregational Nurse Program (Signed)
  Dept: [phone]   Congregational Nurse Program Note  Date of Encounter: 12/23/2021  Past Medical History: Past Medical History:  Diagnosis Date   Dyslipidemia    Essential hypertension, benign    Hyperlipidemia    Thyroid cyst    Type 2 diabetes mellitus ([place])    Vitamin D deficiency     Encounter Details:  CNP Questionnaire - 01/04/22 1552       Questionnaire   Ask client: Do you give verbal consent for me to treat you today? Yes    Student Assistance N/A    Location Patient [place]    Visit Setting with Client [place]    Patient Status Unknown    Sport and exercise psychologist or [place]    Insurance/Financial Assistance Referral N/A    Medication N/A    Medical Provider Yes    Screening Referrals Made N/A    Medical Referrals Made N/A    Medical Appointment Made N/A    Recently w/o PCP, now 1st time PCP visit completed due to CNs referral or appointment made N/A    Food N/A    Transportation N/A    Housing/Utilities N/A    Interpersonal Safety N/A    Interventions Advocate/Support    Abnormal to Normal Screening Since Last CN Visit N/A    Screenings CN Performed Blood Glucose    Sent Client to Lab for: N/A    Did client attend any of the following based off CNs referral or appointments made? N/A    ED Visit Averted N/A    Life-Saving Intervention Made N/A             Patient wanted blood glucose checked. Education concerning hyperglycemia given.

## 2022-02-10 DIAGNOSIS — Z139 Encounter for screening, unspecified: Secondary | ICD-10-CM

## 2022-02-10 LAB — GLUCOSE, POCT (MANUAL RESULT ENTRY): POC Glucose: 147 mg/dl — AB (ref 70–99)

## 2022-02-17 NOTE — Congregational Nurse Program (Signed)
  Dept: [phone]   Congregational Nurse Program Note  Date of Encounter: 02/10/2022  Past Medical History: Past Medical History:  Diagnosis Date   Dyslipidemia    Essential hypertension, benign    Hyperlipidemia    Thyroid cyst    Type 2 diabetes mellitus ([place])    Vitamin D deficiency     Encounter Details:  CNP Questionnaire - 02/10/22 1400       Questionnaire   Ask client: Do you give verbal consent for me to treat you today? Yes    Student Assistance N/A    Location Patient [place]    Visit Setting with Client [place]    Patient Status Unknown    Sport and exercise psychologist or [place]    Insurance/Financial Assistance Referral N/A    Medication N/A    Medical Provider Yes    Screening Referrals Made N/A    Medical Referrals Made N/A    Medical Appointment Made N/A    Recently w/o PCP, now 1st time PCP visit completed due to CNs referral or appointment made N/A    Food N/A    Transportation N/A    Housing/Utilities N/A    Interpersonal Safety N/A    Interventions Educate;Counsel;Spiritual Care    Abnormal to Normal Screening Since Last CN Visit N/A    Screenings CN Performed Blood Glucose    Sent Client to Lab for: N/A    Did client attend any of the following based off CNs referral or appointments made? N/A    ED Visit Averted N/A    Life-Saving Intervention Made N/A            Patient wanted blood glucose obtained. Patient was given education class concerning urinary tract infection. Patient verbalized understanding of all education.

## 2022-04-07 ENCOUNTER — Ambulatory Visit: Payer: Self-pay | Admitting: Physician Assistant

## 2022-04-07 ENCOUNTER — Other Ambulatory Visit (HOSPITAL_COMMUNITY): Payer: Self-pay

## 2022-04-07 ENCOUNTER — Encounter: Payer: Self-pay | Admitting: Physician Assistant

## 2022-04-07 VITALS — BP 166/107 | HR 72 | Ht 64.0 in | Wt 140.0 lb

## 2022-04-07 DIAGNOSIS — I10 Essential (primary) hypertension: Secondary | ICD-10-CM

## 2022-04-07 DIAGNOSIS — E1169 Type 2 diabetes mellitus with other specified complication: Secondary | ICD-10-CM

## 2022-04-07 DIAGNOSIS — Z789 Other specified health status: Secondary | ICD-10-CM

## 2022-04-07 DIAGNOSIS — E559 Vitamin D deficiency, unspecified: Secondary | ICD-10-CM

## 2022-04-07 DIAGNOSIS — N400 Enlarged prostate without lower urinary tract symptoms: Secondary | ICD-10-CM

## 2022-04-07 DIAGNOSIS — R739 Hyperglycemia, unspecified: Secondary | ICD-10-CM

## 2022-04-07 DIAGNOSIS — E876 Hypokalemia: Secondary | ICD-10-CM

## 2022-04-07 DIAGNOSIS — Z125 Encounter for screening for malignant neoplasm of prostate: Secondary | ICD-10-CM

## 2022-04-07 DIAGNOSIS — K2100 Gastro-esophageal reflux disease with esophagitis, without bleeding: Secondary | ICD-10-CM

## 2022-04-07 DIAGNOSIS — E785 Hyperlipidemia, unspecified: Secondary | ICD-10-CM

## 2022-04-07 DIAGNOSIS — Z8673 Personal history of transient ischemic attack (TIA), and cerebral infarction without residual deficits: Secondary | ICD-10-CM

## 2022-04-07 DIAGNOSIS — Z5989 Other problems related to housing and economic circumstances: Secondary | ICD-10-CM

## 2022-04-07 DIAGNOSIS — E782 Mixed hyperlipidemia: Secondary | ICD-10-CM

## 2022-04-07 DIAGNOSIS — Z139 Encounter for screening, unspecified: Secondary | ICD-10-CM

## 2022-04-07 LAB — POCT GLYCOSYLATED HEMOGLOBIN (HGB A1C): Hemoglobin A1C: 12 % — AB (ref 4.0–5.6)

## 2022-04-07 LAB — GLUCOSE, POCT (MANUAL RESULT ENTRY)
POC Glucose: 379 mg/dl — AB (ref 70–99)
POC Glucose: 334 mg/dl — AB (ref 70–99)

## 2022-04-07 MED ORDER — AMLODIPINE BESYLATE 5 MG PO TABS
5.0000 mg | ORAL_TABLET | Freq: Every day | ORAL | 1 refills | Status: DC
  Filled 2022-04-07: qty 30, 30d supply, fill #0

## 2022-04-07 MED ORDER — TRUE METRIX METER W/DEVICE KIT
1.0000 [IU] | PACK | Freq: Once | 0 refills | Status: AC
  Filled 2022-04-07: qty 1, 90d supply, fill #0

## 2022-04-07 MED ORDER — METFORMIN HCL 1000 MG PO TABS
1000.0000 mg | ORAL_TABLET | Freq: Two times a day (BID) | ORAL | 1 refills | Status: DC
  Filled 2022-04-07: qty 60, 30d supply, fill #0

## 2022-04-07 MED ORDER — TRUEPLUS LANCETS 28G MISC
1.0000 | Freq: Two times a day (BID) | 11 refills | Status: DC
  Filled 2022-04-07: qty 100, 50d supply, fill #0

## 2022-04-07 MED ORDER — CLOPIDOGREL BISULFATE 75 MG PO TABS
75.0000 mg | ORAL_TABLET | Freq: Every day | ORAL | 1 refills | Status: DC
  Filled 2022-04-07: qty 30, 30d supply, fill #0

## 2022-04-07 MED ORDER — TRULICITY 0.75 MG/0.5ML ~~LOC~~ SOAJ
0.7500 mg | SUBCUTANEOUS | 4 refills | Status: DC
  Filled 2022-04-07: qty 0.5, 7d supply, fill #0

## 2022-04-07 MED ORDER — TRUE METRIX BLOOD GLUCOSE TEST VI STRP
1.0000 | ORAL_STRIP | Freq: Two times a day (BID) | 12 refills | Status: DC
  Filled 2022-04-07: qty 50, 25d supply, fill #0

## 2022-04-07 MED ORDER — INSULIN ASPART 100 UNIT/ML IJ SOLN
10.0000 [IU] | Freq: Once | INTRAMUSCULAR | Status: AC
  Administered 2022-04-07: 10 [IU] via SUBCUTANEOUS

## 2022-04-07 NOTE — Congregational Nurse Program (Signed)
  Dept: [phone]   Congregational Nurse Program Note  Date of Encounter: 04/07/2022  Past Medical History: Past Medical History:  Diagnosis Date   Dyslipidemia    Essential hypertension, benign    Hyperlipidemia    Thyroid cyst    Type 2 diabetes mellitus ([place])    Vitamin D deficiency     Encounter Details:  CNP Questionnaire - 04/07/22 1528       Questionnaire   Ask client: Do you give verbal consent for me to treat you today? Yes    Student Assistance N/A    Location Patient [place]    Visit Setting with Client [place]    Patient Status Unknown    Insurance Uninsured ([place] Card/Care Connects/Self-Pay/Medicaid Family Planning)    Insurance/Financial Assistance Referral Made Appointment for Client    Medication Referred to Medication Assistance    Medical Provider No    Screening Referrals Made N/A    Medical Referrals Made [facility]    Medical Appointment Made [facility]    Recently w/o PCP, now 1st time PCP visit completed due to CNs referral or appointment made Yes    Food N/A    Transportation N/A    Housing/Utilities N/A    Interpersonal Safety N/A    Interventions Advocate/Support;Case Management;Educate;Spiritual Care;Reviewed Medications    Abnormal to Normal Screening Since Last CN Visit N/A    Screenings CN Performed Blood Glucose;Blood Pressure;Pulse Ox    Sent Client to Lab for: N/A    Did client attend any of the following based off CNs referral or appointments made? Yes    ED Visit Averted Yes    Life-Saving Intervention Made Yes             Patient seen in clinic today. Patient presented with elevated blood glucose. Patient is uninsured and unemployed. [facility].

## 2022-04-07 NOTE — Patient Instructions (Addendum)
You are going to restart taking Plavix once daily.  You are going to restart taking metformin 1000 mg twice daily.  You are also going to use Trulicity once weekly.  I encourage you to go ahead and start the Trulicity as soon as you have your medication.  I encourage you to check your blood sugar levels twice daily, once fasting in the morning and once 2 hours after meal, keep a written log and have available for all office visits.    You are going to restart amlodipine 5 mg once daily, this is for your blood pressure.  I encourage you to check your blood pressure once daily, keep a written log and have available for all office visits.  We will call you with today's lab results.  Kennieth Rad, PA-C Physician Assistant [place] [email or web address]   How to Take Your Blood Pressure Blood pressure is a measurement of how strongly your blood is pressing against the walls of your arteries. Arteries are blood vessels that carry blood from your heart throughout your body. Your health care provider takes your blood pressure at each office visit. You can also take your own blood pressure at home with a blood pressure monitor. You may need to take your own blood pressure to: Confirm a diagnosis of high blood pressure (hypertension). Monitor your blood pressure over time. Make sure your blood pressure medicine is working. Supplies needed: Blood pressure monitor. A chair to sit in. This should be a chair where you can sit upright with your back supported. Do not sit on a soft couch or an armchair. Table or desk. Small notebook and pencil or pen. How to prepare To get the most accurate reading, avoid the following for 30 minutes before you check your blood pressure: Drinking caffeine. Drinking alcohol. Eating. Smoking. Exercising. Five minutes before you check your blood pressure: Use the bathroom and urinate so that you have an empty  bladder. Sit quietly in a chair. Do not talk. How to take your blood pressure To check your blood pressure, follow the instructions in the manual that came with your blood pressure monitor. If you have a digital blood pressure monitor, the instructions may be as follows: Sit up straight in a chair. Place your feet on the floor. Do not cross your ankles or legs. Rest your left arm at the level of your heart on a table or desk or on the arm of a chair. Pull up your shirt sleeve. Wrap the blood pressure cuff around the upper part of your left arm, 1 inch (2.5 cm) above your elbow. It is best to wrap the cuff around bare skin. Fit the cuff snugly, but not too tightly, around your arm. You should be able to place only one finger between the cuff and your arm. Position the cord so that it rests in the bend of your elbow. Press the power button. Sit quietly while the cuff inflates and deflates. Read the digital reading on the monitor screen and write the numbers down (record them) in a notebook. Wait 2-3 minutes, then repeat the steps, starting at step 1. What does my blood pressure reading mean? A blood pressure reading consists of a higher number over a lower number. Ideally, your blood pressure should be below 120/80. The first ("top") number is called the systolic pressure. It is a measure of the pressure in your arteries as your heart beats. The second ("bottom") number is called the diastolic pressure. It is a  measure of the pressure in your arteries as the heart relaxes. Blood pressure is classified into four stages. The following are the stages for adults who do not have a short-term serious illness or a chronic condition. Systolic pressure and diastolic pressure are measured in a unit called mm Hg (millimeters of mercury).  Normal Systolic pressure: below 123456. Diastolic pressure: below 80. Elevated Systolic pressure: [id number]. Diastolic pressure: below 80. Hypertension stage 1 Systolic  pressure: 0000000. Diastolic pressure: [id number]. Hypertension stage 2 Systolic pressure: XX123456 or above. Diastolic pressure: 90 or above. You can have elevated blood pressure or hypertension even if only the systolic or only the diastolic number in your reading is higher than normal. Follow these instructions at home: Medicines Take over-the-counter and prescription medicines only as told by your health care provider. Tell your health care provider if you are having any side effects from blood pressure medicine. General instructions Check your blood pressure as often as recommended by your health care provider. Check your blood pressure at the same time every day. Take your monitor to the next appointment with your health care provider to make sure that: You are using it correctly. It provides accurate readings. Understand what your goal blood pressure numbers are. Keep all follow-up visits. This is important. General tips Your health care provider can suggest a reliable monitor that will meet your needs. There are several types of home blood pressure monitors. Choose a monitor that has an arm cuff. Do not choose a monitor that measures your blood pressure from your wrist or finger. Choose a cuff that wraps snugly, not too tight or too loose, around your upper arm. You should be able to fit only one finger between your arm and the cuff. You can buy a blood pressure monitor at most drugstores or online. Where to find more information American Heart Association: www.heart.org Contact a health care provider if: Your blood pressure is consistently high. Your blood pressure is suddenly low. Get help right away if: Your systolic blood pressure is higher than 180. Your diastolic blood pressure is higher than 120. These symptoms may be an emergency. Get help right away. Call 911. Do not wait to see if the symptoms will go away. Do not drive yourself to the hospital. Summary Blood pressure is a  measurement of how strongly your blood is pressing against the walls of your arteries. A blood pressure reading consists of a higher number over a lower number. Ideally, your blood pressure should be below 120/80. Check your blood pressure at the same time every day. Avoid caffeine, alcohol, smoking, and exercise for 30 minutes prior to checking your blood pressure. These agents can affect the accuracy of the blood pressure reading. This information is not intended to replace advice given to you by your health care provider. Make sure you discuss any questions you have with your health care provider. Document Revised: 10/02/2020 Document Reviewed: 10/02/2020 Elsevier Patient Education  [place].

## 2022-04-07 NOTE — Congregational Nurse Program (Signed)
  Dept: [phone]   Congregational Nurse Program Note  Date of Encounter: 04/07/2022  Past Medical History: Past Medical History:  Diagnosis Date   Dyslipidemia    Essential hypertension, benign    Hyperlipidemia    Thyroid cyst    Type 2 diabetes mellitus ([place])    Vitamin D deficiency     Encounter Details:  CNP Questionnaire - 04/07/22 1453       Questionnaire   Ask client: Do you give verbal consent for me to treat you today? Yes    Student Assistance N/A    Location Patient [facility]    Visit Setting with Client [place]    Patient Status Unknown    Insurance Uninsured ([place] Card/Care Connects/Self-Pay/Medicaid Family Planning)    Insurance/Financial Assistance Referral Made Appointment for Client    Medication Referred to Medication Assistance    Medical Provider No    Screening Referrals Made N/A    Medical Referrals Made [facility]    Medical Appointment [facility]    Recently w/o PCP, now 1st time PCP visit completed due to CNs referral or appointment made Yes    Food N/A    Transportation N/A    Housing/Utilities N/A    Interpersonal Safety N/A    Interventions Advocate/Support;Case Management;Educate;Spiritual Care;Reviewed Medications    Abnormal to Normal Screening Since Last CN Visit N/A    Screenings CN Performed Blood Glucose;Blood Pressure;Pulse Ox    Sent Client to Lab for: N/A    Did client attend any of the following based off CNs referral or appointments made? Yes    ED Visit Averted Yes    Life-Saving Intervention Made Yes             Today's Vitals   04/07/22 1448  BP: (!) 154/82  Pulse: 73  Resp: 18  Temp: 97.6 F (36.4 C)  TempSrc: Temporal  SpO2: 96%  PainSc: 0-No pain   There is no height or weight on file to calculate BMI.  Patient came into clinic and stated he was concerned about his blood glucose. Patient states his blood glucose has been elevated lately. Patient blood glucose  obtained and documented in the patient electronic medical record. Patient declined any symptoms.denies pain. Patient disclosed he has not had a primary provider since August 2023. Patient referred to provider. [facility] r/t to elevated blood glucose.  Will follow up with patient to assist with next appointment and follow up care.

## 2022-04-07 NOTE — Progress Notes (Unsigned)
New Patient Office Visit  Subjective    Patient ID: Darrell [name], male    DOB: 05-18-1955  Age: 67 y.o. MRN: [id number]  CC: No chief complaint on file.   HPI SHON KELTY presents to establish care For the past month and half  Plavix 75 mg once daily   Still taking potassium daily   Feels like does not need the zoloft  Willing to use trulicity  Lipitor made his joints hurt   Outpatient Encounter Medications as of 04/07/2022  Medication Sig   ALPRAZolam (XANAX) 1 MG tablet Take 1 mg by mouth daily.   Multiple Vitamin (MULTIVITAMIN) tablet Take 1 tablet by mouth daily.   potassium chloride (KLOR-CON M) 10 MEQ tablet Take 10 mEq by mouth daily.   RYBELSUS 7 MG TABS Take 7 mg by mouth daily.   sertraline (ZOLOFT) 50 MG tablet Take 50 mg by mouth daily.   SYNJARDY XR 25-1000 MG TB24 Take 1 tablet by mouth daily.   Vitamin D, Ergocalciferol, 50000 units CAPS Take 50,000 Units by mouth once a week. Saturday's   No facility-administered encounter medications on file as of 04/07/2022.    Past Medical History:  Diagnosis Date   Dyslipidemia    Essential hypertension, benign    Hyperlipidemia    Thyroid cyst    Type 2 diabetes mellitus ([place])    Vitamin D deficiency     Past Surgical History:  Procedure Laterality Date   COLONOSCOPY WITH PROPOFOL N/A 04/15/2021   Procedure: COLONOSCOPY WITH PROPOFOL;  Surgeon: Irene Shipper, MD;  Location: [facility];  Service: Gastroenterology;  Laterality: N/A;   ESOPHAGOGASTRODUODENOSCOPY (EGD) WITH PROPOFOL N/A 04/15/2021   Procedure: ESOPHAGOGASTRODUODENOSCOPY (EGD) WITH PROPOFOL;  Surgeon: Irene Shipper, MD;  Location: [facility];  Service: Gastroenterology;  Laterality: N/A;   MELANOMA EXCISION     Back   POLYPECTOMY  04/15/2021   Procedure: POLYPECTOMY;  Surgeon: Irene Shipper, MD;  Location: [facility];  Service: Gastroenterology;;   WISDOM TOOTH EXTRACTION      Family History  Problem Relation Age of Onset   Pulmonary  embolism Mother    Diabetes Father    Hypertension Father    Alzheimer's disease Father    Diabetes Maternal Grandmother    Colon cancer Neg Hx     Social History   Socioeconomic History   Marital status: Single    Spouse name: Not on file   Number of children: 4   Years of education: Not on file   Highest education level: Not on file  Occupational History    Employer: [employer]  Tobacco Use   Smoking status: Never   Smokeless tobacco: Never  Vaping Use   Vaping Use: Never used  Substance and Sexual Activity   Alcohol use: Yes    Comment: occasional   Drug use: No   Sexual activity: Not on file  Other Topics Concern   Not on file  Social History Narrative   Daily caffeine    Social Determinants of Health   Financial Resource Strain: Not on file  Food Insecurity: Not on file  Transportation Needs: Not on file  Physical Activity: Not on file  Stress: Not on file  Social Connections: Not on file  Intimate Partner Violence: Not on file    ROS      Objective    There were no vitals taken for this visit.  Physical Exam  {Labs (Optional):23779}    Assessment & Plan:  Problem List Items Addressed This Visit   None   No follow-ups on file.   Loraine Grip Mayers, PA-C

## 2022-04-08 ENCOUNTER — Encounter: Payer: Self-pay | Admitting: Physician Assistant

## 2022-04-08 LAB — CBC WITH DIFFERENTIAL/PLATELET
Basophils Absolute: 0 10*3/uL (ref 0.0–0.2)
Basos: 1 %
EOS (ABSOLUTE): 0.1 10*3/uL (ref 0.0–0.4)
Eos: 1 %
Hematocrit: 42.8 % (ref 37.5–51.0)
Hemoglobin: 15 g/dL (ref 13.0–17.7)
Immature Grans (Abs): 0 10*3/uL (ref 0.0–0.1)
Immature Granulocytes: 0 %
Lymphocytes Absolute: 1.6 10*3/uL (ref 0.7–3.1)
Lymphs: 19 %
MCH: 32.7 pg (ref 26.6–33.0)
MCHC: 35 g/dL (ref 31.5–35.7)
MCV: 93 fL (ref 79–97)
Monocytes Absolute: 0.4 10*3/uL (ref 0.1–0.9)
Monocytes: 5 %
Neutrophils Absolute: 6 10*3/uL (ref 1.4–7.0)
Neutrophils: 74 %
Platelets: 200 10*3/uL (ref 150–450)
RBC: 4.59 x10E6/uL (ref 4.14–5.80)
RDW: 12.5 % (ref 11.6–15.4)
WBC: 8 10*3/uL (ref 3.4–10.8)

## 2022-04-08 LAB — LIPID PANEL
Chol/HDL Ratio: 6.9 ratio — ABNORMAL HIGH (ref 0.0–5.0)
Cholesterol, Total: 298 mg/dL — ABNORMAL HIGH (ref 100–199)
HDL: 43 mg/dL (ref >39)
LDL Chol Calc (NIH): 194 mg/dL — ABNORMAL HIGH (ref 0–99)
Triglycerides: 308 mg/dL — ABNORMAL HIGH (ref 0–149)
VLDL Cholesterol Cal: 61 mg/dL — ABNORMAL HIGH (ref 5–40)

## 2022-04-08 LAB — MICROALBUMIN / CREATININE URINE RATIO
Creatinine, Urine: 91.5 mg/dL
Microalb/Creat Ratio: 45 mg/g creat — ABNORMAL HIGH (ref 0–29)
Microalbumin, Urine: 41.6 ug/mL

## 2022-04-08 LAB — COMP. METABOLIC PANEL (12)
AST: 12 IU/L (ref 0–40)
Albumin/Globulin Ratio: 1.8 (ref 1.2–2.2)
Albumin: 4.6 g/dL (ref 3.9–4.9)
Alkaline Phosphatase: 78 IU/L (ref 44–121)
BUN/Creatinine Ratio: 14 (ref 10–24)
BUN: 12 mg/dL (ref 8–27)
Bilirubin Total: 0.5 mg/dL (ref 0.0–1.2)
Calcium: 9.8 mg/dL (ref 8.6–10.2)
Chloride: 101 mmol/L (ref 96–106)
Creatinine, Ser: 0.86 mg/dL (ref 0.76–1.27)
Globulin, Total: 2.5 g/dL (ref 1.5–4.5)
Glucose: 357 mg/dL — ABNORMAL HIGH (ref 70–99)
Potassium: 3.2 mmol/L — ABNORMAL LOW (ref 3.5–5.2)
Sodium: 143 mmol/L (ref 134–144)
Total Protein: 7.1 g/dL (ref 6.0–8.5)
eGFR: 97 mL/min/{1.73_m2} (ref >59)

## 2022-04-08 LAB — VITAMIN D 25 HYDROXY (VIT D DEFICIENCY, FRACTURES): Vit D, 25-Hydroxy: 26.3 ng/mL — ABNORMAL LOW (ref 30.0–100.0)

## 2022-04-08 LAB — PSA: Prostate Specific Ag, Serum: 0.6 ng/mL (ref 0.0–4.0)

## 2022-04-08 LAB — TSH: TSH: 1.31 u[IU]/mL (ref 0.450–4.500)

## 2022-04-09 ENCOUNTER — Other Ambulatory Visit (HOSPITAL_COMMUNITY): Payer: Self-pay

## 2022-04-12 ENCOUNTER — Other Ambulatory Visit (HOSPITAL_COMMUNITY): Payer: Self-pay

## 2022-04-12 MED ORDER — POTASSIUM CHLORIDE CRYS ER 10 MEQ PO TBCR
10.0000 meq | EXTENDED_RELEASE_TABLET | Freq: Every day | ORAL | 1 refills | Status: DC
  Filled 2022-04-12: qty 30, 30d supply, fill #0

## 2022-04-12 MED ORDER — EZETIMIBE 10 MG PO TABS
10.0000 mg | ORAL_TABLET | Freq: Every day | ORAL | 1 refills | Status: DC
  Filled 2022-04-12: qty 30, 30d supply, fill #0

## 2022-04-12 NOTE — Addendum Note (Signed)
Addended by: Kennieth Rad on: 04/12/2022 11:52 AM   Modules accepted: Orders

## 2022-04-13 ENCOUNTER — Telehealth: Payer: Self-pay

## 2022-04-13 NOTE — Telephone Encounter (Signed)
Attempted to reach Mr. Kumagai to discuss lab results, unable to reach on all numbers listed in patient chart. Lvm on [phone] to give [facility] a call back to discuss lab results.

## 2022-04-19 ENCOUNTER — Ambulatory Visit (INDEPENDENT_AMBULATORY_CARE_PROVIDER_SITE_OTHER): Payer: Self-pay | Admitting: Nurse Practitioner

## 2022-04-19 ENCOUNTER — Encounter: Payer: Self-pay | Admitting: Nurse Practitioner

## 2022-04-19 VITALS — BP 156/79 | HR 64 | Temp 98.3°F | Ht 64.0 in | Wt 144.8 lb

## 2022-04-19 DIAGNOSIS — I10 Essential (primary) hypertension: Secondary | ICD-10-CM

## 2022-04-19 DIAGNOSIS — E785 Hyperlipidemia, unspecified: Secondary | ICD-10-CM

## 2022-04-19 DIAGNOSIS — Z789 Other specified health status: Secondary | ICD-10-CM

## 2022-04-19 DIAGNOSIS — E559 Vitamin D deficiency, unspecified: Secondary | ICD-10-CM

## 2022-04-19 DIAGNOSIS — E876 Hypokalemia: Secondary | ICD-10-CM

## 2022-04-19 DIAGNOSIS — E782 Mixed hyperlipidemia: Secondary | ICD-10-CM

## 2022-04-19 DIAGNOSIS — E1169 Type 2 diabetes mellitus with other specified complication: Secondary | ICD-10-CM

## 2022-04-19 MED ORDER — EZETIMIBE 10 MG PO TABS: 10.0000 mg | ORAL_TABLET | Freq: Every day | ORAL | 1 refills | Status: DC

## 2022-04-19 MED ORDER — OLMESARTAN MEDOXOMIL-HCTZ 40-25 MG PO TABS: 1.0000 | ORAL_TABLET | Freq: Every day | ORAL | 0 refills | Status: DC

## 2022-04-19 MED ORDER — OLMESARTAN MEDOXOMIL 40 MG PO TABS: 40.0000 mg | ORAL_TABLET | Freq: Every day | ORAL | 0 refills | Status: DC

## 2022-04-19 MED ORDER — TRULICITY 0.75 MG/0.5ML ~~LOC~~ SOAJ: 0.7500 mg | SUBCUTANEOUS | 4 refills | Status: DC

## 2022-04-19 MED ORDER — HYDROCHLOROTHIAZIDE 25 MG PO TABS: 25.0000 mg | ORAL_TABLET | Freq: Every day | ORAL | 0 refills | Status: DC

## 2022-04-19 MED ORDER — NIFEDIPINE ER OSMOTIC RELEASE 30 MG PO TB24: 30.0000 mg | ORAL_TABLET | Freq: Every day | ORAL | 1 refills | Status: DC

## 2022-04-19 NOTE — Assessment & Plan Note (Signed)
Takes potassium 10 mill equivalents daily Lab Results  Component Value Date   NA 143 04/07/2022   K 3.2 (L) 04/07/2022   CO2 26 04/15/2021   GLUCOSE 357 (H) 04/07/2022   BUN 12 04/07/2022   CREATININE 0.86 04/07/2022   CALCIUM 9.8 04/07/2022   EGFR 97 04/07/2022   GFRNONAA >60 04/15/2021

## 2022-04-19 NOTE — Assessment & Plan Note (Signed)
Patient encouraged to take OTC vitamin D 2000 units daily Last vitamin D Lab Results  Component Value Date   VD25OH 26.3 (L) 04/07/2022

## 2022-04-19 NOTE — Assessment & Plan Note (Signed)
Lab Results  Component Value Date   HGBA1C 12.0 (A) 04/07/2022  Currently on metformin 500 mg twice daily Rx for Trulicity [id number] mg once weekly injection resent to the pharmacy, states that he now has a new insurance Patient counseled on low-carb modified diet Engage in regular moderate exercises at least 150 minutes weekly as tolerated

## 2022-04-19 NOTE — Congregational Nurse Program (Signed)
  Dept: [phone]   Congregational Nurse Program Note  Date of Encounter: 04/09/2022  Past Medical History: Past Medical History:  Diagnosis Date   Dyslipidemia    Essential hypertension, benign    Hyperlipidemia    Thyroid cyst    Type 2 diabetes mellitus ([place])    Vitamin D deficiency     Encounter Details:  CNP Questionnaire - 04/09/22 1528       Questionnaire   Ask client: Do you give verbal consent for me to treat you today? Yes    Student Assistance N/A    Location Patient [place]    Visit Setting with Client Home    Patient Status Unknown    Insurance Private or [place]   unknown working with patient to obtain information   Insurance/Financial Assistance Referral N/A    Medication Referred to Medication Assistance;Made Appointment for Client    Medical Provider No    Screening Referrals Made N/A    Medical Appointment [facility]    Recently w/o PCP, now 1st time PCP visit completed due to CNs referral or appointment made Yes    Food N/A    Transportation N/A    Housing/Utilities N/A    Interpersonal Safety N/A    Interventions Advocate/Support;Case Management;Educate;Spiritual Care;Reviewed Medications    Abnormal to Normal Screening Since Last CN Visit N/A    Sent Client to Lab for: N/A    Did client attend any of the following based off CNs referral or appointments made? Yes    ED Visit Averted Yes    Life-Saving Intervention Made Yes            Medication, glucometer, and glucometer supplies was taken to patient by congregational nurse. Upcoming Appointment scheduled for patient on 04/19/22. Patient given information concerning upcoming appointment. Patient states he will be to attend the appointment. Assisted patient in finding out he has insurance coverage. Insurance company to send patient a insurance card in the mail. Patient will have a PCP established on upcoming appointment. Patient given information about  food pantries if he needs assistance with food. Patient state he will follow up with patient concerning blood glucose results.

## 2022-04-19 NOTE — Patient Instructions (Addendum)
Please get your TDAP vaccine, shingles vaccine at the pharmacy  Nurse please get records from [place] in [place].    Goal for fasting blood sugar ranges from 80 to 120 and 2 hours after any meal or at bedtime should be between 130 to 170.  Around 3 times per week, check your blood pressure 2 times per day. once in the morning and once in the evening. The readings should be at least one minute apart. Write down these values and bring them to your next nurse visit/appointment.  When you check your BP, make sure you have been doing something calm/relaxing 5 minutes prior to checking. Both feet should be flat on the floor and you should be sitting. Use your left arm and make sure it is in a relaxed position (on a table), and that the cuff is at the approximate level/height of your heart.   Blood pressure goal is less than 130/80  Please start taking vitamin D 2000 units daily    It is important that you exercise regularly at least 30 minutes 5 times a week as tolerated  Think about what you will eat, plan ahead. Choose " clean, green, fresh or frozen" over canned, processed or packaged foods which are more sugary, salty and fatty. 70 to 75% of food eaten should be vegetables and fruit. Three meals at set times with snacks allowed between meals, but they must be fruit or vegetables. Aim to eat over a 12 hour period , example 7 am to 7 pm, and STOP after  your last meal of the day. Drink water,generally about 64 ounces per day, no other drink is as healthy. Fruit juice is best enjoyed in a healthy way, by EATING the fruit.  Thanks for choosing [facility] we consider it a privelige to serve you.

## 2022-04-19 NOTE — Assessment & Plan Note (Addendum)
BP Readings from Last 3 Encounters:  04/19/22 (!) 156/79  04/07/22 (!) 166/107  04/07/22 (!) 154/82  Chronic uncontrolled condition Continue  nifedipine 30 mg daily start Benicar -HCTZ 40-25mg  daily.  He has not started taking amlodipine he was told not to take amlodipine since he is currently on nifedipine  Discussed DASH diet and dietary sodium restrictions Continue to increase dietary efforts and exercise.  Check BMP in 2 weeks follow-up in the office with a nurse to recheck blood pressure in 2 weeks  - NIFEdipine (PROCARDIA-XL/NIFEDICAL-XL) 30 MG 24 hr tablet; Take 1 tablet (30 mg total) by mouth daily.  Dispense: 90 tablet; Refill: 1 - Basic Metabolic Panel; Future - Recheck vitals - olmesartan-hydrochlorothiazide (BENICAR HCT) 40-25 MG tablet; Take 1 tablet by mouth daily.  Dispense: 90 tablet; Refill: 0  Patient referred to our clinical pharmacist to assist with cost of medication

## 2022-04-19 NOTE — Progress Notes (Signed)
New Patient Office Visit  Subjective:  Patient ID: Darrell [name], male    DOB: 03-Jan-1956  Age: 67 y.o. MRN: [id number]  CC:  Chief Complaint  Patient presents with   Establish Care    HPI Darrell [name] is a 67 y.o. male  has a past medical history of Dyslipidemia, Essential hypertension, benign, Hyperlipidemia, Thyroid cyst, Type 2 diabetes mellitus ([place]), and Vitamin D deficiency.  Patient presents to establish care for his chronic medical conditions .  He is currently receiving care from [facility] in [place].  [facility]  Records were requested from the [facility] today.    Hypertension.  He is currently on nifedipine 30 mg daily, hydrochlorothiazide 25 mg daily, amlodipine was recently ordered but stated that he has not been taking the medication.  Review of the patient's chart shows that the patient was on olmesartan-hydrochlorothiazide 40-25 mg 1 tablet daily at one time. patient denies chest pain, dizziness, edema.  Type 2 diabetes.  Currently on metformin 500 mg twice daily, Trulicity was recently ordered but states that he has not picked up the medication, not sure why pharmacy did not refill med.  Zetia 10 mg daily recently ordered for the patient has not been taking the medication.  Stated that atorvastatin gave him joint pain in the past.   He had recently presented to the [facility] mobile clinic for medication refill on 04/07/2022.  Patient reports that he now has a new insurance card.      Past Medical History:  Diagnosis Date   Dyslipidemia    Essential hypertension, benign    Hyperlipidemia    Thyroid cyst    Type 2 diabetes mellitus ([place])    Vitamin D deficiency     Past Surgical History:  Procedure Laterality Date   COLONOSCOPY WITH PROPOFOL N/A 04/15/2021   Procedure: COLONOSCOPY WITH PROPOFOL;  Surgeon: Irene Shipper, MD;  Location: [facility];  Service: Gastroenterology;  Laterality: N/A;    ESOPHAGOGASTRODUODENOSCOPY (EGD) WITH PROPOFOL N/A 04/15/2021   Procedure: ESOPHAGOGASTRODUODENOSCOPY (EGD) WITH PROPOFOL;  Surgeon: Irene Shipper, MD;  Location: [facility];  Service: Gastroenterology;  Laterality: N/A;   MELANOMA EXCISION     Back   POLYPECTOMY  04/15/2021   Procedure: POLYPECTOMY;  Surgeon: Irene Shipper, MD;  Location: [facility];  Service: Gastroenterology;;   WISDOM TOOTH EXTRACTION      Family History  Problem Relation Age of Onset   Pulmonary embolism Mother    Diabetes Father    Hypertension Father    Alzheimer's disease Father    Diabetes Maternal Grandmother    Colon cancer Neg Hx     Social History   Socioeconomic History   Marital status: Single    Spouse name: Not on file   Number of children: 4   Years of education: Not on file   Highest education level: Not on file  Occupational History    Employer: [employer]  Tobacco Use   Smoking status: Never   Smokeless tobacco: Never  Vaping Use   Vaping Use: Never used  Substance and Sexual Activity   Alcohol use: Yes    Comment: occasional   Drug use: No   Sexual activity: Not on file  Other Topics Concern   Not on file  Social History Narrative   Lives home alone   Social Determinants of Health   Financial Resource Strain: Not on file  Food Insecurity: Not on  file  Transportation Needs: Not on file  Physical Activity: Not on file  Stress: Not on file  Social Connections: Not on file  Intimate Partner Violence: Not on file    ROS Review of Systems  Constitutional: Negative.   Respiratory: Negative.    Cardiovascular: Negative.   Gastrointestinal: Negative.   Musculoskeletal: Negative.   Skin: Negative.   Neurological: Negative.   Psychiatric/Behavioral: Negative.      Objective:   Today's Vitals: BP (!) 156/79   Pulse 64   Temp 98.3 F (36.8 C)   Ht 5\' 4"  (1.626 m)   Wt 144 lb 12.8 oz (65.7 kg)   SpO2 97%   BMI 24.85 kg/m   Physical Exam Constitutional:       General: He is not in acute distress.    Appearance: Normal appearance. He is not ill-appearing, toxic-appearing or diaphoretic.  Eyes:     General: No scleral icterus.       Right eye: No discharge.        Left eye: No discharge.  Cardiovascular:     Rate and Rhythm: Normal rate and regular rhythm.     Pulses: Normal pulses.     Heart sounds: Normal heart sounds. No murmur heard.    No friction rub. No gallop.  Pulmonary:     Effort: Pulmonary effort is normal. No respiratory distress.     Breath sounds: No stridor. No wheezing, rhonchi or rales.  Chest:     Chest wall: No tenderness.  Abdominal:     General: There is no distension.     Palpations: Abdomen is soft.     Tenderness: There is no abdominal tenderness. There is no guarding.  Musculoskeletal:        General: No swelling or signs of injury.     Right lower leg: No edema.     Left lower leg: No edema.  Skin:    General: Skin is warm.     Coloration: Skin is not jaundiced or pale.     Findings: No bruising or lesion.  Neurological:     Mental Status: He is alert and oriented to person, place, and time.     Sensory: No sensory deficit.     Coordination: Coordination normal.     Gait: Gait normal.  Psychiatric:        Mood and Affect: Mood normal.        Behavior: Behavior normal.        Thought Content: Thought content normal.     Assessment & Plan:   Problem List Items Addressed This Visit       Cardiovascular and Mediastinum   Hypertension - Primary    BP Readings from Last 3 Encounters:  04/19/22 (!) 156/79  04/07/22 (!) 166/107  04/07/22 (!) 154/82  Chronic uncontrolled condition Continue  nifedipine 30 mg daily start Benicar -HCTZ 40-25mg  daily.  He has not started taking amlodipine he was told not to take amlodipine since he is currently on nifedipine  Discussed DASH diet and dietary sodium restrictions Continue to increase dietary efforts and exercise.  Check BMP in 2 weeks follow-up in the  office with a nurse to recheck blood pressure in 2 weeks  - NIFEdipine (PROCARDIA-XL/NIFEDICAL-XL) 30 MG 24 hr tablet; Take 1 tablet (30 mg total) by mouth daily.  Dispense: 90 tablet; Refill: 1 - Basic Metabolic Panel; Future - Recheck vitals - olmesartan-hydrochlorothiazide (BENICAR HCT) 40-25 MG tablet; Take 1 tablet by mouth daily.  Dispense: 90  tablet; Refill: 0  Patient referred to our clinical pharmacist to assist with cost of medication      Relevant Medications   NIFEdipine (PROCARDIA-XL/NIFEDICAL-XL) 30 MG 24 hr tablet   ezetimibe (ZETIA) 10 MG tablet   olmesartan-hydrochlorothiazide (BENICAR HCT) 40-25 MG tablet   Other Relevant Orders   Basic Metabolic Panel   Recheck vitals   AMB Referral to Pharmacy Medication Management     Endocrine   Type 2 diabetes mellitus with hyperlipidemia (HCC)    Lab Results  Component Value Date   HGBA1C 12.0 (A) 04/07/2022  Currently on metformin 500 mg twice daily Rx for Trulicity A999333 mg once weekly injection resent to the pharmacy, states that he now has a new insurance Patient counseled on low-carb modified diet Engage in regular moderate exercises at least 150 minutes weekly as tolerated      Relevant Medications   NIFEdipine (PROCARDIA-XL/NIFEDICAL-XL) 30 MG 24 hr tablet   Dulaglutide (TRULICITY) A999333 0000000 SOPN   ezetimibe (ZETIA) 10 MG tablet   olmesartan-hydrochlorothiazide (BENICAR HCT) 40-25 MG tablet   Other Relevant Orders   AMB Referral to Pharmacy Medication Management     Other   Hyperlipidemia    Lab Results  Component Value Date   CHOL 298 (H) 04/07/2022   HDL 43 04/07/2022   LDLCALC 194 (H) 04/07/2022   TRIG 308 (H) 04/07/2022   CHOLHDL 6.9 (H) 04/07/2022  LDL goal is less than 55 Patient was encouraged to start taking Zetia 10 mg daily He might need Repatha injection if his LDL went next recheck his labs plan discussed with the patient      Relevant Medications   NIFEdipine  (PROCARDIA-XL/NIFEDICAL-XL) 30 MG 24 hr tablet   ezetimibe (ZETIA) 10 MG tablet   olmesartan-hydrochlorothiazide (BENICAR HCT) 40-25 MG tablet   Hypokalemia    Takes potassium 10 mill equivalents daily Lab Results  Component Value Date   NA 143 04/07/2022   K 3.2 (L) 04/07/2022   CO2 26 04/15/2021   GLUCOSE 357 (H) 04/07/2022   BUN 12 04/07/2022   CREATININE 0.86 04/07/2022   CALCIUM 9.8 04/07/2022   EGFR 97 04/07/2022   GFRNONAA >60 04/15/2021         Vitamin D deficiency    Patient encouraged to take OTC vitamin D 2000 units daily Last vitamin D Lab Results  Component Value Date   VD25OH 26.3 (L) 04/07/2022         Other Visit Diagnoses     Statin intolerance       Relevant Medications   ezetimibe (ZETIA) 10 MG tablet       Outpatient Encounter Medications as of 04/19/2022  Medication Sig   clopidogrel (PLAVIX) 75 MG tablet Take 1 tablet (75 mg total) by mouth daily.   glucose blood (TRUE METRIX BLOOD GLUCOSE TEST) test strip Use as instructed to check blood sugar twice daily.    metFORMIN (GLUCOPHAGE) 1000 MG tablet Take 1 tablet (1,000 mg total) by mouth 2 (two) times daily with a meal.   NIFEdipine (PROCARDIA-XL/NIFEDICAL-XL) 30 MG 24 hr tablet Take 1 tablet (30 mg total) by mouth daily.   TRUEplus Lancets 28G MISC Use 2 (two) times daily at 8 am and 10 pm.   [DISCONTINUED] hydrochlorothiazide (HYDRODIURIL) 25 MG tablet Take 1 tablet (25 mg total) by mouth daily.   [DISCONTINUED] olmesartan (BENICAR) 40 MG tablet Take 1 tablet (40 mg total) by mouth daily.   Dulaglutide (TRULICITY) A999333 0000000 SOPN Inject 0.75 mg into the skin  once a week.   ezetimibe (ZETIA) 10 MG tablet Take 1 tablet (10 mg total) by mouth daily.   Multiple Vitamin (MULTIVITAMIN) tablet Take 1 tablet by mouth daily. (Patient not taking: Reported on 04/07/2022)   olmesartan-hydrochlorothiazide (BENICAR HCT) 40-25 MG tablet Take 1 tablet by mouth daily.   potassium chloride (KLOR-CON M) 10 MEQ  tablet Take 1 tablet (10 mEq total) by mouth daily. (Patient not taking: Reported on 04/19/2022)   sertraline (ZOLOFT) 50 MG tablet Take 50 mg by mouth daily. (Patient not taking: Reported on 04/19/2022)   [DISCONTINUED] amLODipine (NORVASC) 5 MG tablet Take 1 tablet (5 mg total) by mouth daily. (Patient not taking: Reported on 04/19/2022)   [DISCONTINUED] Dulaglutide (TRULICITY) A999333 0000000 SOPN Inject 0.75 mg into the skin once a week. (Patient not taking: Reported on 04/19/2022)   [DISCONTINUED] ezetimibe (ZETIA) 10 MG tablet Take 1 tablet (10 mg total) by mouth daily. (Patient not taking: Reported on 04/19/2022)   [DISCONTINUED] olmesartan-hydrochlorothiazide (BENICAR HCT) 40-25 MG tablet Take 1 tablet by mouth daily. (Patient not taking: Reported on 04/19/2022)   [DISCONTINUED] olmesartan-hydrochlorothiazide (BENICAR HCT) 40-25 MG tablet Take 1 tablet by mouth daily.   [DISCONTINUED] Semaglutide (RYBELSUS) 7 MG TABS Take by mouth. (Patient not taking: Reported on 04/19/2022)   [DISCONTINUED] Vitamin D, Ergocalciferol, (DRISDOL) 1.25 MG (50000 UNIT) CAPS capsule Take 50,000 Units by mouth every 7 (seven) days. (Patient not taking: Reported on 04/19/2022)   No facility-administered encounter medications on file as of 04/19/2022.    Follow-up: Return in about 4 weeks (around 05/17/2022) for HTN/DM.   Renee Rival, FNP

## 2022-04-19 NOTE — Assessment & Plan Note (Signed)
Lab Results  Component Value Date   CHOL 298 (H) 04/07/2022   HDL 43 04/07/2022   LDLCALC 194 (H) 04/07/2022   TRIG 308 (H) 04/07/2022   CHOLHDL 6.9 (H) 04/07/2022  LDL goal is less than 55 Patient was encouraged to start taking Zetia 10 mg daily He might need Repatha injection if his LDL went next recheck his labs plan discussed with the patient

## 2022-04-20 ENCOUNTER — Other Ambulatory Visit: Payer: Self-pay

## 2022-04-20 ENCOUNTER — Telehealth: Payer: Self-pay

## 2022-04-20 NOTE — Progress Notes (Signed)
   Care Guide Note  04/20/2022 Name: Darrell [name] MRN: [id number] DOB: 08/04/1955  Referred by: Renee Rival, FNP Reason for referral : Care Coordination (Outreach to schedule with Pharm d )   Darrell [name] is a 67 y.o. year old male who is a primary care patient of Renee Rival, FNP. [name] was referred to the pharmacist for assistance related to HTN and DM.    An unsuccessful telephone outreach was attempted today to contact the patient who was referred to the pharmacy team for assistance with medication assistance. Additional attempts will be made to contact the patient.   [name], [place], [place] [address] Direct Dial: [phone] [email or web address]

## 2022-04-23 ENCOUNTER — Other Ambulatory Visit (HOSPITAL_COMMUNITY): Payer: Self-pay

## 2022-04-23 NOTE — Progress Notes (Unsigned)
   Care Guide Note  04/23/2022 Name: BUNNIE GILLITZER MRN: [id number] DOB: 16-May-1955  Referred by: Renee Rival, FNP Reason for referral : Care Coordination (Outreach to schedule with Pharm d )   DARRYAL GOTTE is a 67 y.o. year old male who is a primary care patient of Renee Rival, FNP. [name] was referred to the pharmacist for assistance related to DM.    A second unsuccessful telephone outreach was attempted today to contact the patient who was referred to the pharmacy team for assistance with medication assistance. Additional attempts will be made to contact the patient.  [name], [place], [place] [address] Direct Dial: [phone] [email or web address]

## 2022-04-26 ENCOUNTER — Encounter: Payer: Self-pay | Admitting: Pharmacist

## 2022-04-26 NOTE — Progress Notes (Signed)
   Care Guide Note  04/26/2022 Name: Darrell [name] MRN: [id number] DOB: 01-16-1956  Referred by: Renee Rival, FNP Reason for referral : Care Coordination (Outreach to schedule with Pharm d )   Darrell [name] is a 67 y.o. year old male who is a primary care patient of Renee Rival, FNP. [name] was referred to the pharmacist for assistance related to DM.    Successful contact was made with the patient to discuss pharmacy services including being ready for the pharmacist to call at least 5 minutes before the scheduled appointment time, to have medication bottles and any blood sugar or blood pressure readings ready for review. The patient agreed to meet with the pharmacist via with the pharmacist via telephone visit on (date/time).  05/11/2022  [name], [place], [place] [address] Direct Dial: [phone] [email or web address]

## 2022-04-30 ENCOUNTER — Other Ambulatory Visit (HOSPITAL_COMMUNITY): Payer: Self-pay

## 2022-05-07 ENCOUNTER — Ambulatory Visit: Payer: Self-pay

## 2022-05-07 VITALS — BP 142/77 | HR 89 | Ht 65.0 in | Wt 144.0 lb

## 2022-05-07 DIAGNOSIS — I10 Essential (primary) hypertension: Secondary | ICD-10-CM

## 2022-05-07 NOTE — Progress Notes (Unsigned)
   Blood Pressure Recheck Visit  Name: SAHWN BODDEN MRN: [id number] Date of Birth: 15-Jul-1955  [name] presents today for Blood Pressure recheck with clinical support staff.  Order for BP recheck by Edwin Dada , ordered on 04/19/22 .   BP Readings from Last 3 Encounters:  05/07/22 (!) 142/77  04/19/22 (!) 156/79  04/07/22 (!) 166/107    Current Outpatient Medications  Medication Sig Dispense Refill   clopidogrel (PLAVIX) 75 MG tablet Take 1 tablet (75 mg total) by mouth daily. 30 tablet 1   glucose blood (TRUE METRIX BLOOD GLUCOSE TEST) test strip Use as instructed to check blood sugar twice daily.  100 each 12   metFORMIN (GLUCOPHAGE) 1000 MG tablet Take 1 tablet (1,000 mg total) by mouth 2 (two) times daily with a meal. 60 tablet 1   olmesartan-hydrochlorothiazide (BENICAR HCT) 40-25 MG tablet Take 1 tablet by mouth daily. 90 tablet 0   potassium chloride (KLOR-CON M) 10 MEQ tablet Take 1 tablet (10 mEq total) by mouth daily. 30 tablet 1   TRUEplus Lancets 28G MISC Use 2 (two) times daily at 8 am and 10 pm. 100 each 11   Dulaglutide (TRULICITY) 0.75 MG/0.5ML SOPN Inject 0.75 mg into the skin once a week. 0.5 mL 4   ezetimibe (ZETIA) 10 MG tablet Take 1 tablet (10 mg total) by mouth daily. (Patient not taking: Reported on 05/07/2022) 30 tablet 1   Multiple Vitamin (MULTIVITAMIN) tablet Take 1 tablet by mouth daily. (Patient not taking: Reported on 04/07/2022)     NIFEdipine (PROCARDIA-XL/NIFEDICAL-XL) 30 MG 24 hr tablet Take 1 tablet (30 mg total) by mouth daily. 90 tablet 1   sertraline (ZOLOFT) 50 MG tablet Take 50 mg by mouth daily. (Patient not taking: Reported on 04/19/2022)     No current facility-administered medications for this visit.    Hypertensive Medication Review: Patient states that they are taking all their hypertensive medications as prescribed and their last dose of hypertensive medications was this morning   Documentation of any medication adherence  discrepancies: none  Provider Recommendation:  Spoke to [employer]  and they stated: Recommend pt to continue with current medications.  Patient has been scheduled to follow up with 05/19/22    Patient has been given provider's recommendations and does not have any questions or concerns at this time. Patient will contact the office for any future questions or concerns.   Raj Janus, CMA

## 2022-05-08 LAB — BASIC METABOLIC PANEL
BUN/Creatinine Ratio: 14 (ref 10–24)
BUN: 12 mg/dL (ref 8–27)
CO2: 23 mmol/L (ref 20–29)
Calcium: 9.8 mg/dL (ref 8.6–10.2)
Chloride: 101 mmol/L (ref 96–106)
Creatinine, Ser: 0.83 mg/dL (ref 0.76–1.27)
Glucose: 197 mg/dL — ABNORMAL HIGH (ref 70–99)
Potassium: 3.4 mmol/L — ABNORMAL LOW (ref 3.5–5.2)
Sodium: 143 mmol/L (ref 134–144)
eGFR: 98 mL/min/{1.73_m2} (ref >59)

## 2022-05-11 ENCOUNTER — Other Ambulatory Visit: Payer: Self-pay | Admitting: Pharmacist

## 2022-05-11 ENCOUNTER — Ambulatory Visit: Payer: Self-pay | Admitting: Nurse Practitioner

## 2022-05-11 DIAGNOSIS — E1169 Type 2 diabetes mellitus with other specified complication: Secondary | ICD-10-CM

## 2022-05-11 DIAGNOSIS — Z8673 Personal history of transient ischemic attack (TIA), and cerebral infarction without residual deficits: Secondary | ICD-10-CM

## 2022-05-11 DIAGNOSIS — I10 Essential (primary) hypertension: Secondary | ICD-10-CM

## 2022-05-11 MED ORDER — CLOPIDOGREL BISULFATE 75 MG PO TABS: 75.0000 mg | ORAL_TABLET | Freq: Every day | ORAL | 1 refills | Status: DC

## 2022-05-11 MED ORDER — NIFEDIPINE ER OSMOTIC RELEASE 30 MG PO TB24
30.0000 mg | ORAL_TABLET | Freq: Every day | ORAL | 1 refills | Status: DC
Start: 2022-05-11 — End: 2022-05-19

## 2022-05-11 MED ORDER — DEXCOM G7 SENSOR MISC: 11 refills | Status: DC

## 2022-05-11 MED ORDER — TRULICITY 0.75 MG/0.5ML ~~LOC~~ SOAJ: 0.7500 mg | SUBCUTANEOUS | 2 refills | Status: DC

## 2022-05-11 MED ORDER — METFORMIN HCL 1000 MG PO TABS
1000.0000 mg | ORAL_TABLET | Freq: Two times a day (BID) | ORAL | 1 refills | Status: DC
Start: 2022-05-11 — End: 2022-08-13

## 2022-05-11 MED ORDER — OLMESARTAN MEDOXOMIL-HCTZ 40-25 MG PO TABS
1.0000 | ORAL_TABLET | Freq: Every day | ORAL | 1 refills | Status: DC
Start: 2022-05-11 — End: 2022-08-27

## 2022-05-11 NOTE — Progress Notes (Unsigned)
05/11/2022 Name: Darrell [name] MRN: [id number] DOB: 07/25/1955  Chief Complaint  Patient presents with   Medication Management   Hypertension    Darrell [name] is a 67 y.o. year old male who presented for a telephone visit.   They were referred to the pharmacist by their PCP for assistance in managing diabetes, hypertension, and hyperlipidemia.   Subjective:  Care Team: Primary Care Provider: Donell BeersPaseda, Folashade R, FNP ; Next Scheduled Visit: 05/19/22  Medication Access/Adherence  Current Pharmacy:  [facility] [address] - [place] ([address]), [place] - [address] [place] [address] Phone: [phone] Fax: [phone]  [facility] [place], [place] - [address] [place] [address] Phone: [phone] Fax: [phone]  [facility] - [place], [place] - [id number] [facility] [address] [place] [address] Phone: [phone] Fax: [phone]  [place] [facility] [address] [place] [address] Phone: [phone] Fax: [phone]   Patient reports affordability concerns with their medications: Yes  Patient reports access/transportation concerns to their pharmacy: No  Patient reports adherence concerns with their medications:  Yes     Diabetes:  Current medications: metformin 1000 mg twice daily, prescribed Trulicity 0.75 mg weekly  Medications tried in the past: previously on Rybelsus, Januvia, Janumet   Current glucose readings: 180-280  Patient denies hypoglycemic s/sx including dizziness, shakiness, sweating. Patient reports hyperglycemic symptoms including polyuria, polydipsia, blurred vision. Reports improvement in polyuria, improvement in nocturia.   Current meal patterns:  - Breakfast: oatmeal, eggs, meat (sausage, bacon); shredded wheat cereal   - Lunch: likes fish, occasional fried foods; similar meals  for supper; notes he is trying to cut back on white breads, sugars, pasta  - Snacks: toast; chips- moderates portion sizes  - Drinks: water, also flavored seltzer water (zero sugar)  Current physical activity: does calisthenics, chair exercises; will sometimes walk at the park   Current medication access support: unable to pick up Trulicity due to cost.   Hypertension:  Current medications: nifedipine XL 30 mg daily, olmesartan/HCTZ 40/25 mg daily- though reports he does not have this medication  Patient has a watch that checks; reports home readings have been 150-180s/70-90s  Patient denies hypotensive s/sx including dizziness, lightheadedness.  Patient denies hypertensive symptoms including headache, chest pain, shortness of breath  Hyperlipidemia/ASCVD Risk Reduction  Current lipid lowering medications: ezetimibe prescribed, but has not been able ot afford  Medications tried in the past: atorvastatin, rosuvastatin,   Antiplatelet regimen: clopidogrel 75 mg daily  SAMS: - Distribution: symmetric thigh aches - Temporal onset: reports symptoms started within 2-4 weeks of starting medication - Temporal offset: reports symptoms resolved "pretty quickly" upon medication discontinuation - Rechallenge: reports symptoms came back "pretty quickly" with rechallenge  History of ASCVD: TIA  SAMs score = 11 = probably statin associated muscle symptoms  PREVENT Risk Score: 10 year risk of CVD: 38.5% - 10 year risk of ASCVD: 29.3% - 10 year risk of HF: 26.9%  Objective:  Lab Results  Component Value Date   HGBA1C 12.0 (A) 04/07/2022    Lab Results  Component Value Date   CREATININE 0.83 05/07/2022   BUN 12 05/07/2022   NA 143 05/07/2022   K 3.4 (L) 05/07/2022   CL 101 05/07/2022   CO2 23 05/07/2022    Lab Results  Component Value Date   CHOL 298 (H)  04/07/2022   HDL 43 04/07/2022   LDLCALC 194 (H) 04/07/2022   TRIG 308 (H) 04/07/2022   CHOLHDL 6.9 (H) 04/07/2022     Medications Reviewed Today     Reviewed by Darrell [name], RPH-CPP (Pharmacist) on 05/11/22 at 1110  Med List Status: <None>   Medication Order Taking? Sig Documenting Provider Last Dose Status Informant  clopidogrel (PLAVIX) 75 MG tablet [id number]  Take 1 tablet (75 mg total) by mouth daily. Donell Beers, FNP  Active   Continuous Blood Gluc Sensor (DEXCOM G7 SENSOR) MISC [id number] Yes Use to check glucose continuously Paseda, Baird Kay, FNP  Active   Dulaglutide (TRULICITY) 0.75 MG/0.5ML SOPN [id number] No Inject 0.75 mg into the skin once a week.  Patient not taking: Reported on 05/11/2022   Donell Beers, FNP Not Taking Active   Patient not taking:  Discontinued 05/11/22 0925   glucose blood (TRUE METRIX BLOOD GLUCOSE TEST) test strip [id number]  Use as instructed to check blood sugar twice daily.  Mayers, Cari S, PA-C  Active   metFORMIN (GLUCOPHAGE) 1000 MG tablet [id number]  Take 1 tablet (1,000 mg total) by mouth 2 (two) times daily with a meal. Paseda, Baird Kay, FNP  Active   Multiple Vitamin (MULTIVITAMIN) tablet [id number]  Take 1 tablet by mouth daily.  Patient not taking: Reported on 04/07/2022   [provider]  Active Self  NIFEdipine (PROCARDIA-XL/NIFEDICAL-XL) 30 MG 24 hr tablet [id number]  Take 1 tablet (30 mg total) by mouth daily. Donell Beers, FNP  Active   olmesartan-hydrochlorothiazide (BENICAR HCT) 40-25 MG tablet [id number]  Take 1 tablet by mouth daily. Donell Beers, FNP  Active   potassium chloride (KLOR-CON M) 10 MEQ tablet [id number] Yes Take 1 tablet (10 mEq total) by mouth daily. Mayers, Kasandra Knudsen, PA-C Taking Active    Patient not taking:   Discontinued 05/11/22 0911 (Completed Course)   TRUEplus Lancets 28G MISC [id number]  Use 2 (two) times daily at 8 am and 10 pm. Mayers, Cari S, PA-C  Active               Assessment/Plan:   Diabetes: - Currently uncontrolled - Reviewed long term cardiovascular and renal outcomes  of uncontrolled blood sugar - Reviewed goal A1c, goal fasting, and goal 2 hour post prandial glucose - Reviewed dietary modifications including: focus on lean proteins, vegetables and fruits, whole grains, increase in hydration with water - Reviewed lifestyle modifications including: eventual goal of increasing physical activity to 150 minutes weekly - Recommend to continue metformin 1000 mg twice daily and add GLP. Patient is more interested in pursuing Rybelsus therapy as he was on this before. Will complete PA for Rybelsus. PA was denied as the plan prefers Trulicity/Victoza. Will complete PA for Trulicity. PA approved for Trulicity. Contacted pharmacy, copay is $25/month.  - Patient also interested in re-starting CGM therapy. Started PA for [place] and appears DexCom does not require a PA on his plan. Will follow up with pharmacy to determine cost.    Hypertension: - Currently uncontrolled due to medication access - Reviewed long term cardiovascular and renal outcomes of uncontrolled blood pressure - Reviewed appropriate blood pressure monitoring technique and reviewed goal blood pressure. Recommended to check home blood pressure and heart rate daily - Recommend to continue current regimen. Patient's [employer] plan may [facility] a [facility], discussed with PCP. [facility] Will contact pharmacy to discuss cost.   Hyperlipidemia/ASCVD Risk Reduction: - Currently uncontrolled.  -  Reviewed long term complications of uncontrolled cholesterol - Given statin due to muscle pains and high SAMs score, recommend non-statin therapy. Given history of ASCVD (TIA) with LDL >190, PCSK9i therapy is the most appropriate option at this time. Will complete PA on Cover My Meds.    Follow Up Plan: PCP next week, PharmD in 4 weeks  Catie TClearance Coots, PharmD, BCACP, CPP [facility] [phone]

## 2022-05-11 NOTE — Patient Instructions (Signed)
Darrell [name],   Check your blood sugars twice daily:  1) Fasting, first thing in the morning before breakfast and  2) 2 hours after your largest meal.   For a goal A1c of less than 7%, goal fasting readings are less than 130 and goal 2 hour after meal readings are less than 180.

## 2022-05-14 ENCOUNTER — Other Ambulatory Visit: Payer: Medicaid Other | Admitting: Pharmacist

## 2022-05-14 MED ORDER — REPATHA SURECLICK 140 MG/ML ~~LOC~~ SOAJ: 140.0000 mg | SUBCUTANEOUS | 2 refills | Status: DC

## 2022-05-14 NOTE — Progress Notes (Addendum)
Care Coordination Call  Received notification that Repatha was approved by insurance (through 11/12/2022). Order sent per prior discussion with PCP. Patient notified. He confirms he picked up medications, including Trulicity and [facility]Com, from [facility]. Will follow up with PCP next week on BP as scheduled.   Catie Eppie Gibson, PharmD, BCACP, CPP [facility] [phone]

## 2022-05-17 ENCOUNTER — Ambulatory Visit: Payer: Self-pay | Admitting: Nurse Practitioner

## 2022-05-19 ENCOUNTER — Ambulatory Visit (INDEPENDENT_AMBULATORY_CARE_PROVIDER_SITE_OTHER): Payer: Self-pay | Admitting: Nurse Practitioner

## 2022-05-19 VITALS — BP 143/83 | HR 73 | Temp 97.6°F | Ht 65.0 in | Wt 144.2 lb

## 2022-05-19 DIAGNOSIS — E782 Mixed hyperlipidemia: Secondary | ICD-10-CM

## 2022-05-19 DIAGNOSIS — E559 Vitamin D deficiency, unspecified: Secondary | ICD-10-CM

## 2022-05-19 DIAGNOSIS — I10 Essential (primary) hypertension: Secondary | ICD-10-CM

## 2022-05-19 DIAGNOSIS — E1169 Type 2 diabetes mellitus with other specified complication: Secondary | ICD-10-CM

## 2022-05-19 DIAGNOSIS — E785 Hyperlipidemia, unspecified: Secondary | ICD-10-CM

## 2022-05-19 MED ORDER — NIFEDIPINE ER OSMOTIC RELEASE 60 MG PO TB24
60.0000 mg | ORAL_TABLET | Freq: Every day | ORAL | 0 refills | Status: DC
Start: 2022-05-19 — End: 2022-08-27

## 2022-05-19 NOTE — Assessment & Plan Note (Signed)
Lab Results  Component Value Date   HGBA1C 12.0 (A) 04/07/2022  Reports blood sugar readings of around 190s Continue metformin 1000 mg twice daily Trulicity 0.75 mg once weekly injection, plan to increase Trulicity to 3 mg once weekly after 4 weeks Patient counseled on low-carb modified diet Encouraged to exercise daily at least 250 minutes weekly Diabetic foot exam completed today Appreciate collaboration with Catie

## 2022-05-19 NOTE — Assessment & Plan Note (Signed)
BP Readings from Last 3 Encounters:  05/19/22 (!) 143/83  05/07/22 (!) 142/77  04/19/22 (!) 156/79  Chronic condition currently uncontrolled Continue Benicar 40-25 mg 1 tablet daily, start nifedipine 60 mg daily Blood pressure goal is less than 130/80 discussed discussed with the patient Discussed DASH diet and dietary sodium restrictions Continue to increase dietary efforts and exercise.  Appreciate collaboration with Catie Follow-up in 2 months

## 2022-05-19 NOTE — Assessment & Plan Note (Addendum)
Patient encouraged to pick up the prescription for Repatha 140 mg every 2 weeks injection LDL goal is less than 55 Avoid fatty fried foods

## 2022-05-19 NOTE — Patient Instructions (Addendum)
Please start taking vitamin D 1000 units daily for vitamin D deficiency   1. Primary hypertension  - NIFEdipine (PROCARDIA XL/NIFEDICAL XL) 60 MG 24 hr tablet; Take 1 tablet (60 mg total) by mouth daily.  Dispense: 90 tablet; Refill: 0      It is important that you exercise regularly at least 30 minutes 5 times a week as tolerated  Think about what you will eat, plan ahead. Choose " clean, green, fresh or frozen" over canned, processed or packaged foods which are more sugary, salty and fatty. 70 to 75% of food eaten should be vegetables and fruit. Three meals at set times with snacks allowed between meals, but they must be fruit or vegetables. Aim to eat over a 12 hour period , example 7 am to 7 pm, and STOP after  your last meal of the day. Drink water,generally about 64 ounces per day, no other drink is as healthy. Fruit juice is best enjoyed in a healthy way, by EATING the fruit.  Thanks for choosing [facility] we consider it a privelige to serve you.

## 2022-05-19 NOTE — Progress Notes (Signed)
Established Patient Office Visit  Subjective:  Patient ID: Darrell [name], male    DOB: [birth year]  Age: 66 y.o. MRN: [id number]  CC:  Chief Complaint  Patient presents with   Follow-up    HPI Darrell [name] is a 67 y.o. male   has a past medical history of Dyslipidemia, Essential hypertension, benign, Hyperlipidemia, Thyroid cyst, Type 2 diabetes mellitus (HCC), and Vitamin D deficiency.  Patient presents for follow-up for his chronic medical conditions .  Hypertension.  Currently on nifedipine 30 mg daily, olmesartan-hydrochlorothiazide 40-25 mg 1 tablet daily.  He has been checking his blood pressure at home but does not remember the readings.  He has also started doing some exercises, reports that his diet can be better.  Type 2 diabetes.  Currently on metformin 1000 mg twice daily, Trulicity 0.75 mg once weekly injection.  He denies polyuria, polydipsia polyphagia, hypoglycemia.  He plans going to pick up the prescription for Repatha injection today  Patient denies any adverse reaction to current medications    Past Medical History:  Diagnosis Date   Dyslipidemia    Essential hypertension, benign    Hyperlipidemia    Thyroid cyst    Type 2 diabetes mellitus (HCC)    Vitamin D deficiency     Past Surgical History:  Procedure Laterality Date   COLONOSCOPY WITH PROPOFOL N/A 04/15/2021   Procedure: COLONOSCOPY WITH PROPOFOL;  Surgeon: Hilarie Fredrickson, MD;  Location: [facility];  Service: Gastroenterology;  Laterality: N/A;   ESOPHAGOGASTRODUODENOSCOPY (EGD) WITH PROPOFOL N/A 04/15/2021   Procedure: ESOPHAGOGASTRODUODENOSCOPY (EGD) WITH PROPOFOL;  Surgeon: Hilarie Fredrickson, MD;  Location: [facility];  Service: Gastroenterology;  Laterality: N/A;   MELANOMA EXCISION     Back   POLYPECTOMY  04/15/2021   Procedure: POLYPECTOMY;  Surgeon: Hilarie Fredrickson, MD;  Location: [facility];  Service: Gastroenterology;;   WISDOM TOOTH EXTRACTION      Family History  Problem Relation Age of  Onset   Pulmonary embolism Mother    Diabetes Father    Hypertension Father    Alzheimer's disease Father    Diabetes Maternal Grandmother    Colon cancer Neg Hx     Social History   Socioeconomic History   Marital status: Single    Spouse name: Not on file   Number of children: 4   Years of education: Not on file   Highest education level: Not on file  Occupational History    Employer: [employer]  Tobacco Use   Smoking status: Never   Smokeless tobacco: Never  Vaping Use   Vaping Use: Never used  Substance and Sexual Activity   Alcohol use: Yes    Comment: occasional   Drug use: No   Sexual activity: Not on file  Other Topics Concern   Not on file  Social History Narrative   Lives home alone   Social Determinants of Health   Financial Resource Strain: Not on file  Food Insecurity: Not on file  Transportation Needs: Not on file  Physical Activity: Not on file  Stress: Not on file  Social Connections: Not on file  Intimate Partner Violence: Not on file    Outpatient Medications Prior to Visit  Medication Sig Dispense Refill   clopidogrel (PLAVIX) 75 MG tablet Take 1 tablet (75 mg total) by mouth daily. 90 tablet 1   Continuous Blood Gluc Sensor (DEXCOM G7 SENSOR) MISC Use to check glucose continuously 3 each 11   Dulaglutide (TRULICITY) 0.75 MG/0.5ML SOPN  Inject 0.75 mg into the skin once a week. 2 mL 2   glucose blood (TRUE METRIX BLOOD GLUCOSE TEST) test strip Use as instructed to check blood sugar twice daily.  100 each 12   metFORMIN (GLUCOPHAGE) 1000 MG tablet Take 1 tablet (1,000 mg total) by mouth 2 (two) times daily with a meal. 90 tablet 1   Multiple Vitamin (MULTIVITAMIN) tablet Take 1 tablet by mouth daily.     olmesartan-hydrochlorothiazide (BENICAR HCT) 40-25 MG tablet Take 1 tablet by mouth daily. 90 tablet 1   potassium chloride (KLOR-CON M) 10 MEQ tablet Take 1 tablet (10 mEq total) by mouth daily. 30 tablet 1   TRUEplus Lancets 28G MISC Use 2 (two)  times daily at 8 am and 10 pm. 100 each 11   NIFEdipine (PROCARDIA-XL/NIFEDICAL-XL) 30 MG 24 hr tablet Take 1 tablet (30 mg total) by mouth daily. 90 tablet 1   Evolocumab (REPATHA SURECLICK) 140 MG/ML SOAJ Inject 140 mg into the skin every 14 (fourteen) days. (Patient not taking: Reported on 05/19/2022) 2 mL 2   No facility-administered medications prior to visit.    No Known Allergies  ROS Review of Systems  Constitutional: Negative.   Respiratory: Negative.    Cardiovascular: Negative.   Endocrine: Negative.   Genitourinary: Negative.   Musculoskeletal: Negative.   Neurological: Negative.   Psychiatric/Behavioral: Negative.        Objective:    Physical Exam Constitutional:      General: He is not in acute distress.    Appearance: Normal appearance. He is not ill-appearing, toxic-appearing or diaphoretic.  Cardiovascular:     Rate and Rhythm: Normal rate and regular rhythm.     Pulses: Normal pulses.     Heart sounds: Normal heart sounds. No murmur heard.    No friction rub. No gallop.  Pulmonary:     Effort: Pulmonary effort is normal. No respiratory distress.     Breath sounds: Normal breath sounds. No stridor. No wheezing, rhonchi or rales.  Chest:     Chest wall: No tenderness.  Abdominal:     General: There is no distension.     Palpations: Abdomen is soft.     Tenderness: There is no abdominal tenderness. There is no guarding.  Musculoskeletal:        General: No tenderness, deformity or signs of injury.     Right lower leg: No edema.     Left lower leg: No edema.  Skin:    General: Skin is warm and dry.     Capillary Refill: Capillary refill takes less than 2 seconds.  Neurological:     Mental Status: He is alert and oriented to person, place, and time.     Motor: No weakness.     Coordination: Coordination normal.     Gait: Gait normal.  Psychiatric:        Mood and Affect: Mood normal.        Behavior: Behavior normal.        Thought Content:  Thought content normal.        Judgment: Judgment normal.     BP (!) 143/83   Pulse 73   Temp 97.6 F (36.4 C)   Ht  (1.651 m)   Wt 144 lb 3.2 oz (65.4 kg)   SpO2 98%   BMI 24.00 kg/m  Wt Readings from Last 3 Encounters:  05/19/22 144 lb 3.2 oz (65.4 kg)  05/07/22 144 lb (65.3 kg)  04/19/22 144 lb 12.8  oz (65.7 kg)    Lab Results  Component Value Date   TSH 1.310 04/07/2022   Lab Results  Component Value Date   WBC 8.0 04/07/2022   HGB 15.0 04/07/2022   HCT 42.8 04/07/2022   MCV 93 04/07/2022   PLT 200 04/07/2022   Lab Results  Component Value Date   NA 143 05/07/2022   K 3.4 (L) 05/07/2022   CO2 23 05/07/2022   GLUCOSE 197 (H) 05/07/2022   BUN 12 05/07/2022   CREATININE 0.83 05/07/2022   BILITOT 0.5 04/07/2022   ALKPHOS 78 04/07/2022   AST 12 04/07/2022   ALT 13 04/14/2021   PROT 7.1 04/07/2022   ALBUMIN 4.6 04/07/2022   CALCIUM 9.8 05/07/2022   ANIONGAP 9 04/15/2021   EGFR 98 05/07/2022   Lab Results  Component Value Date   CHOL 298 (H) 04/07/2022   Lab Results  Component Value Date   HDL 43 04/07/2022   Lab Results  Component Value Date   LDLCALC 194 (H) 04/07/2022   Lab Results  Component Value Date   TRIG 308 (H) 04/07/2022   Lab Results  Component Value Date   CHOLHDL 6.9 (H) 04/07/2022   Lab Results  Component Value Date   HGBA1C 12.0 (A) 04/07/2022      Assessment & Plan:   Problem List Items Addressed This Visit       Cardiovascular and Mediastinum   Hypertension - Primary    BP Readings from Last 3 Encounters:  05/19/22 (!) 143/83  05/07/22 (!) 142/77  04/19/22 (!) 156/79  Chronic condition currently uncontrolled Continue Benicar 40-25 mg 1 tablet daily, start nifedipine 60 mg daily Blood pressure goal is less than 130/80 discussed discussed with the patient Discussed DASH diet and dietary sodium restrictions Continue to increase dietary efforts and exercise.  Appreciate collaboration with Catie Follow-up in 2  months      Relevant Medications   NIFEdipine (PROCARDIA XL/NIFEDICAL XL) 60 MG 24 hr tablet     Endocrine   Type 2 diabetes mellitus with hyperlipidemia    Lab Results  Component Value Date   HGBA1C 12.0 (A) 04/07/2022  Reports blood sugar readings of around 190s Continue metformin 1000 mg twice daily Trulicity 0.75 mg once weekly injection, plan to increase Trulicity to 3 mg once weekly after 4 weeks Patient counseled on low-carb modified diet Encouraged to exercise daily at least 250 minutes weekly Diabetic foot exam completed today Appreciate collaboration with Catie      Relevant Medications   NIFEdipine (PROCARDIA XL/NIFEDICAL XL) 60 MG 24 hr tablet     Other   Hyperlipidemia    Patient encouraged to pick up the prescription for Repatha 140 mg every 2 weeks injection LDL goal is less than 55 Avoid fatty fried foods      Relevant Medications   NIFEdipine (PROCARDIA XL/NIFEDICAL XL) 60 MG 24 hr tablet   Vitamin D deficiency    Takes daily multiple vitamin that contains some vitamin D but does not know its units of vitamin D content.  Patient encouraged to start taking vitamin D 1000 units daily.       Meds ordered this encounter  Medications   NIFEdipine (PROCARDIA XL/NIFEDICAL XL) 60 MG 24 hr tablet    Sig: Take 1 tablet (60 mg total) by mouth daily.    Dispense:  90 tablet    Refill:  0    Follow-up: Return in about 2 months (around 07/19/2022) for T2DM.    [employer]  Cato Mulligan, FNP

## 2022-05-19 NOTE — Assessment & Plan Note (Signed)
Takes daily multiple vitamin that contains some vitamin D but does not know its units of vitamin D content.  Patient encouraged to start taking vitamin D 1000 units daily.

## 2022-05-27 ENCOUNTER — Telehealth: Payer: Self-pay

## 2022-05-27 NOTE — Progress Notes (Signed)
  Care Coordination Note  05/27/2022 Name: Darrell [name] MRN: [id number] DOB: 1955-07-03  Darrell [name] is a 67 y.o. year old male who is a primary care patient of Donell Beers, FNP and is actively engaged with the Chronic Care Management team. I reached out to Darrell [name] by phone today to assist with re-scheduling a follow up visit with the Pharmacist  Follow up plan: Unsuccessful telephone outreach attempt made. A HIPAA compliant phone message was left for the patient providing contact information and requesting a return call.  The care management team will reach out to the patient again over the next 7 days.  If patient returns call to provider office, please advise to call CCM Care Guide Atharv Barriere  at [phone]  Penne Lash, RMA Care Guide [facility]  [place], [place] [address] Direct Dial: [phone] [email or web address]

## 2022-06-15 NOTE — Progress Notes (Signed)
   Care Guide Note  06/15/2022 Name: Darrell [name] MRN: [id number] DOB: 03-Sep-1955  Referred by: Donell Beers, FNP Reason for referral : Care Coordination (Outreach to reschedule with Pharm d due to being out of office )   [name] is a 67 y.o. year old male who is a primary care patient of Donell Beers, FNP. [name] was referred to the pharmacist for assistance related to DM.    An unsuccessful telephone outreach was attempted today to contact the patient who was referred to the pharmacy team for assistance with medication management. Additional attempts will be made to contact the patient.   Penne Lash, RMA Care Guide [facility]  [place], [place] [address] Direct Dial: [phone] [email or web address]

## 2022-06-17 ENCOUNTER — Other Ambulatory Visit: Payer: Medicaid Other | Admitting: Pharmacist

## 2022-06-22 NOTE — Progress Notes (Signed)
   Care Guide Note  06/22/2022 Name: Darrell [name] MRN: [id number] DOB: 08-Nov-1955  Referred by: Donell Beers, FNP Reason for referral : Care Coordination (Outreach to reschedule with Pharm d due to being out of office )   Darrell [name] is a 67 y.o. year old male who is a primary care patient of Donell Beers, FNP. Darrell [name] was referred to the pharmacist for assistance related to HLD and DM.    A third unsuccessful telephone outreach was attempted today to contact the patient who was referred to the pharmacy team for assistance with medication management. The Population Health team is pleased to engage with this patient at any time in the future upon receipt of referral and should he/she be interested in assistance from the [facility] team.   Penne Lash, RMA Care Guide [facility]  [place], [place] [address] Direct Dial: [phone] [email or web address]

## 2022-08-13 ENCOUNTER — Other Ambulatory Visit: Payer: Self-pay | Admitting: Nurse Practitioner

## 2022-08-13 DIAGNOSIS — E1169 Type 2 diabetes mellitus with other specified complication: Secondary | ICD-10-CM

## 2022-08-27 ENCOUNTER — Ambulatory Visit (INDEPENDENT_AMBULATORY_CARE_PROVIDER_SITE_OTHER): Payer: 59 | Admitting: Nurse Practitioner

## 2022-08-27 ENCOUNTER — Encounter: Payer: Self-pay | Admitting: Nurse Practitioner

## 2022-08-27 VITALS — BP 171/103 | HR 57 | Temp 97.3°F | Ht 65.0 in | Wt 146.8 lb

## 2022-08-27 DIAGNOSIS — Z789 Other specified health status: Secondary | ICD-10-CM

## 2022-08-27 DIAGNOSIS — Z8673 Personal history of transient ischemic attack (TIA), and cerebral infarction without residual deficits: Secondary | ICD-10-CM

## 2022-08-27 DIAGNOSIS — E785 Hyperlipidemia, unspecified: Secondary | ICD-10-CM | POA: Diagnosis not present

## 2022-08-27 DIAGNOSIS — I10 Essential (primary) hypertension: Secondary | ICD-10-CM

## 2022-08-27 DIAGNOSIS — E782 Mixed hyperlipidemia: Secondary | ICD-10-CM | POA: Diagnosis not present

## 2022-08-27 DIAGNOSIS — E1169 Type 2 diabetes mellitus with other specified complication: Secondary | ICD-10-CM | POA: Diagnosis not present

## 2022-08-27 DIAGNOSIS — Z91148 Patient's other noncompliance with medication regimen for other reason: Secondary | ICD-10-CM

## 2022-08-27 HISTORY — DX: Other specified health status: Z78.9

## 2022-08-27 LAB — POCT GLYCOSYLATED HEMOGLOBIN (HGB A1C)
HbA1c POC (<> result, manual entry): 7.9 % (ref 4.0–5.6)
Hemoglobin A1C: 7.9 % — AB (ref 4.0–5.6)

## 2022-08-27 MED ORDER — CLONIDINE HCL 0.1 MG PO TABS
0.1000 mg | ORAL_TABLET | Freq: Once | ORAL | Status: AC
Start: 2022-08-27 — End: 2022-08-27
  Administered 2022-08-27: 0.1 mg via ORAL

## 2022-08-27 MED ORDER — OLMESARTAN MEDOXOMIL-HCTZ 40-25 MG PO TABS
1.0000 | ORAL_TABLET | Freq: Every day | ORAL | 1 refills | Status: DC
Start: 2022-08-27 — End: 2022-09-24

## 2022-08-27 MED ORDER — REPATHA SURECLICK 140 MG/ML ~~LOC~~ SOAJ
140.0000 mg | SUBCUTANEOUS | 2 refills | Status: DC
Start: 2022-08-27 — End: 2022-09-24

## 2022-08-27 MED ORDER — METFORMIN HCL 1000 MG PO TABS
1000.0000 mg | ORAL_TABLET | Freq: Two times a day (BID) | ORAL | 3 refills | Status: DC
Start: 2022-08-27 — End: 2022-09-24

## 2022-08-27 MED ORDER — NIFEDIPINE ER OSMOTIC RELEASE 60 MG PO TB24: 60.0000 mg | ORAL_TABLET | Freq: Every day | ORAL | 1 refills | Status: DC

## 2022-08-27 MED ORDER — CLOPIDOGREL BISULFATE 75 MG PO TABS: 75.0000 mg | ORAL_TABLET | Freq: Every day | ORAL | 1 refills | Status: DC

## 2022-08-27 NOTE — Assessment & Plan Note (Signed)
Need to take all medications daily as ordered discussed with the patient

## 2022-08-27 NOTE — Patient Instructions (Addendum)
Darrell [name], PharmD, BCACP, CPP [facility] [phone]   Your blood pressure goal is less than 130/80.  Goal for fasting blood sugar ranges from 80 to 120 and 2 hours after any meal or at bedtime should be between 130 to 170.   1. History of TIA (transient ischemic attack)  - clopidogrel (PLAVIX) 75 MG tablet; Take 1 tablet (75 mg total) by mouth daily.  Dispense: 90 tablet; Refill: 1 - Evolocumab (REPATHA SURECLICK) 140 MG/ML SOAJ; Inject 140 mg into the skin every 14 (fourteen) days.  Dispense: 2 mL; Refill: 2 - Ambulatory referral to Neurology  2. Type 2 diabetes mellitus with hyperlipidemia (HCC)  - POCT glycosylated hemoglobin (Hb A1C) - metFORMIN (GLUCOPHAGE) 1000 MG tablet; Take 1 tablet (1,000 mg total) by mouth 2 (two) times daily with a meal.  Dispense: 60 tablet; Refill: 3 - Ambulatory referral to [facility]pert[facility] - AMB Referral to Pharmacy Medication Management - Ambulatory referral to Ophthalmology  3. Primary hypertension  - NIFEdipine (PROCARDIA XL/NIFEDICAL XL) 60 MG 24 hr tablet; Take 1 tablet (60 mg total) by mouth daily.  Dispense: 90 tablet; Refill: 1 - olmesartan-hydrochlorothiazide (BENICAR HCT) 40-25 MG tablet; Take 1 tablet by mouth daily.  Dispense: 90 tablet; Refill: 1 - CMP14+EGFR - Ambulatory referral to [facility] Hypertension Clinic  4. Mixed hyperlipidemia  - Evolocumab (REPATHA SURECLICK) 140 MG/ML SOAJ; Inject 140 mg into the skin every 14 (fourteen) days.  Dispense: 2 mL; Refill: 2   It is important that you exercise regularly at least 30 minutes 5 times a week as tolerated  Think about what you will eat, plan ahead. Choose " clean, green, fresh or frozen" over canned, processed or packaged foods which are more sugary, salty and fatty. 70 to 75% of food eaten should be vegetables and fruit. Three meals at set times with snacks allowed between meals, but they must be fruit or vegetables. Aim to eat over a 12 hour  period , example 7 am to 7 pm, and STOP after  your last meal of the day. Drink water,generally about 64 ounces per day, no other drink is as healthy. Fruit juice is best enjoyed in a healthy way, by EATING the fruit.  Thanks for choosing [facility] we consider it a privelige to serve you.

## 2022-08-27 NOTE — Assessment & Plan Note (Signed)
Has not been able to pick up Repatha due to cost of medication Patient referred to the clinical pharmacist for assistance

## 2022-08-27 NOTE — Progress Notes (Signed)
Established Patient Office Visit  Subjective:  Patient ID: Darrell [name], male    DOB: 1955/10/13  Age: 67 y.o. MRN: [id number]  CC:  Chief Complaint  Patient presents with   Follow-up    HPI Darrell [name] is a 67 y.o. male  has a past medical history of Dyslipidemia, Essential hypertension, benign, Hyperlipidemia, Thyroid cyst, Type 2 diabetes mellitus (HCC), and Vitamin D deficiency.  Patient presents for follow-up for his chronic medical conditions  Hypertension.  Has nifedipine 60 mg daily, olmesartan-hydrochlorothiazide 40-25 mg daily ordered.  Patient stated that he ran out of his medications some weeks ago.  Stated that he went to the pharmacy but he was not given medication refills.  He denies chest pain, dizziness, edema.   Type 2 diabetes.  Currently on metformin 1000 mg twice daily. he expressed concerns with not being able to afford cost of some of his medication especially Trulicity and Repatha.  Has not been able to refill Trulicity since he ran out of the medication never picked up Repatha due to cost.  He has been staying away from sugars, has a nurse I did check titers diabetes education.   History of TIA.  Currently on Plavix 75 mg daily.  Has been out of the medication medication refilled today.  I discussed referral to neurology to determine if he needs to continue on Plavix.  Patient was referred to the clinical pharmacist to assist with medication management but he has been missing the appointments.  Need to keep all his appointments and get his chronic medical conditions under control were discussed  Records were requested from the [place] clinic at his last appointment but we have not received his records yet.    Past Medical History:  Diagnosis Date   Dyslipidemia    Essential hypertension, benign    Hyperlipidemia    Thyroid cyst    Type 2 diabetes mellitus (HCC)    Vitamin D deficiency     Past Surgical History:  Procedure Laterality Date   COLONOSCOPY  WITH PROPOFOL N/A 04/15/2021   Procedure: COLONOSCOPY WITH PROPOFOL;  Surgeon: Hilarie Fredrickson, MD;  Location: [facility];  Service: Gastroenterology;  Laterality: N/A;   ESOPHAGOGASTRODUODENOSCOPY (EGD) WITH PROPOFOL N/A 04/15/2021   Procedure: ESOPHAGOGASTRODUODENOSCOPY (EGD) WITH PROPOFOL;  Surgeon: Hilarie Fredrickson, MD;  Location: [facility];  Service: Gastroenterology;  Laterality: N/A;   MELANOMA EXCISION     Back   POLYPECTOMY  04/15/2021   Procedure: POLYPECTOMY;  Surgeon: Hilarie Fredrickson, MD;  Location: [facility];  Service: Gastroenterology;;   WISDOM TOOTH EXTRACTION      Family History  Problem Relation Age of Onset   Pulmonary embolism Mother    Diabetes Father    Hypertension Father    Alzheimer's disease Father    Diabetes Maternal Grandmother    Colon cancer Neg Hx     Social History   Socioeconomic History   Marital status: Single    Spouse name: Not on file   Number of children: 4   Years of education: Not on file   Highest education level: Not on file  Occupational History    Employer: [employer]  Tobacco Use   Smoking status: Never   Smokeless tobacco: Never  Vaping Use   Vaping status: Never Used  Substance and Sexual Activity   Alcohol use: Yes    Comment: occasional   Drug use: No   Sexual activity: Not on file  Other Topics Concern   Not  on file  Social History Narrative   Lives home alone   Social Determinants of Health   Financial Resource Strain: Not on file  Food Insecurity: Not on file  Transportation Needs: Not on file  Physical Activity: Not on file  Stress: Not on file  Social Connections: Not on file  Intimate Partner Violence: Not on file    Outpatient Medications Prior to Visit  Medication Sig Dispense Refill   glucose blood (TRUE METRIX BLOOD GLUCOSE TEST) test strip Use as instructed to check blood sugar twice daily.  100 each 12   Multiple Vitamin (MULTIVITAMIN) tablet Take 1 tablet by mouth daily.     potassium chloride  (KLOR-CON M) 10 MEQ tablet Take 1 tablet (10 mEq total) by mouth daily. 30 tablet 1   TRUEplus Lancets 28G MISC Use 2 (two) times daily at 8 am and 10 pm. 100 each 11   metFORMIN (GLUCOPHAGE) 1000 MG tablet TAKE 1 TABLET (1,000 MG TOTAL) BY MOUTH TWICE A DAY WITH FOOD 60 tablet 2   Continuous Blood Gluc Sensor (DEXCOM G7 SENSOR) MISC Use to check glucose continuously (Patient not taking: Reported on 08/27/2022) 3 each 11   Dulaglutide (TRULICITY) 0.75 MG/0.5ML SOPN Inject 0.75 mg into the skin once a week. (Patient not taking: Reported on 08/27/2022) 2 mL 2   clopidogrel (PLAVIX) 75 MG tablet Take 1 tablet (75 mg total) by mouth daily. (Patient not taking: Reported on 08/27/2022) 90 tablet 1   Evolocumab (REPATHA SURECLICK) 140 MG/ML SOAJ Inject 140 mg into the skin every 14 (fourteen) days. (Patient not taking: Reported on 05/19/2022) 2 mL 2   NIFEdipine (PROCARDIA XL/NIFEDICAL XL) 60 MG 24 hr tablet Take 1 tablet (60 mg total) by mouth daily. (Patient not taking: Reported on 08/27/2022) 90 tablet 0   olmesartan-hydrochlorothiazide (BENICAR HCT) 40-25 MG tablet Take 1 tablet by mouth daily. (Patient not taking: Reported on 08/27/2022) 90 tablet 1   No facility-administered medications prior to visit.    No Known Allergies  ROS Review of Systems    Objective:    Physical Exam  BP (!) 171/103   Pulse (!) 57   Temp (!) 97.3 F (36.3 C)   Ht 5\' 5"  (1.651 m)   Wt 146 lb 12.8 oz (66.6 kg)   SpO2 97%   BMI 24.43 kg/m  Wt Readings from Last 3 Encounters:  08/27/22 146 lb 12.8 oz (66.6 kg)  05/19/22 144 lb 3.2 oz (65.4 kg)  05/07/22 144 lb (65.3 kg)    Lab Results  Component Value Date   TSH 1.310 04/07/2022   Lab Results  Component Value Date   WBC 8.0 04/07/2022   HGB 15.0 04/07/2022   HCT 42.8 04/07/2022   MCV 93 04/07/2022   PLT 200 04/07/2022   Lab Results  Component Value Date   NA 143 05/07/2022   K 3.4 (L) 05/07/2022   CO2 23 05/07/2022   GLUCOSE 197 (H) 05/07/2022    BUN 12 05/07/2022   CREATININE 0.83 05/07/2022   BILITOT 0.5 04/07/2022   ALKPHOS 78 04/07/2022   AST 12 04/07/2022   ALT 13 04/14/2021   PROT 7.1 04/07/2022   ALBUMIN 4.6 04/07/2022   CALCIUM 9.8 05/07/2022   ANIONGAP 9 04/15/2021   EGFR 98 05/07/2022   Lab Results  Component Value Date   CHOL 298 (H) 04/07/2022   Lab Results  Component Value Date   HDL 43 04/07/2022   Lab Results  Component Value Date   LDLCALC  194 (H) 04/07/2022   Lab Results  Component Value Date   TRIG 308 (H) 04/07/2022   Lab Results  Component Value Date   CHOLHDL 6.9 (H) 04/07/2022   Lab Results  Component Value Date   HGBA1C 7.9 (A) 08/27/2022   HGBA1C 7.9 08/27/2022      Assessment & Plan:   Problem List Items Addressed This Visit       Cardiovascular and Mediastinum   Hypertension    BP Readings from Last 3 Encounters:  08/27/22 (!) 166/78  05/19/22 (!) 143/83  05/07/22 (!) 142/77  Has been out of all his bp medications Medications refilled today Need to take all medications daily as prescribed discussed DASH diet advised engage in regular moderate exercises at least 150 minutes weekly Clonidine 0.2 mg one-time dose given Benicar 40-25 mg 1 tablet daily, nifedipine 60 mg daily refilled Patient referred to advanced hypertension clinic Encouraged to maintain close follow-up with Katie Follow-up in 4 weeks Urgent referral sent to the clinical pharmacist      Relevant Medications   NIFEdipine (PROCARDIA XL/NIFEDICAL XL) 60 MG 24 hr tablet   olmesartan-hydrochlorothiazide (BENICAR HCT) 40-25 MG tablet   Evolocumab (REPATHA SURECLICK) 140 MG/ML SOAJ   Other Relevant Orders   CMP14+EGFR   Ambulatory referral to Advanced Hypertension Clinic     Endocrine   Type 2 diabetes mellitus with hyperlipidemia (HCC)    Lab Results  Component Value Date   HGBA1C 12.0 (A) 04/07/2022  A1c is much improved to 7.9 today However he has not been able to continue on Trulicity due to cost,  will consider adding glipizide if Trulicity is not affordable for the patient Continue metformin 1000 mg twice daily Referral sent to the clinical pharmacist to assist with cost of medication Patient counseled on low-carb modified diet He was encouraged to pick his calls as to be reaching out to him      Relevant Medications   metFORMIN (GLUCOPHAGE) 1000 MG tablet   NIFEdipine (PROCARDIA XL/NIFEDICAL XL) 60 MG 24 hr tablet   olmesartan-hydrochlorothiazide (BENICAR HCT) 40-25 MG tablet   Evolocumab (REPATHA SURECLICK) 140 MG/ML SOAJ   Other Relevant Orders   POCT glycosylated hemoglobin (Hb A1C) (Completed)   Ambulatory referral to Advanced Hypertension Clinic   AMB Referral to Pharmacy Medication Management   Ambulatory referral to Ophthalmology     Other   Hyperlipidemia - Primary    Has not been able to pick up Repatha due to cost of medication Patient referred to the clinical pharmacist for assistance      Relevant Medications   NIFEdipine (PROCARDIA XL/NIFEDICAL XL) 60 MG 24 hr tablet   olmesartan-hydrochlorothiazide (BENICAR HCT) 40-25 MG tablet   Evolocumab (REPATHA SURECLICK) 140 MG/ML SOAJ   Other Relevant Orders   Lipid panel   Noncompliance with medications    Need to take all medications daily as ordered discussed with the patient      Hx of transient ischemic attack (TIA)    Continue Plavix 75 mg daily Patient referred to neurology to determine if patient should continue on medication      Statin intolerance    Statin intolerance cannot afford cost of Repatha.  Patient referred to the pharmacy for assistance      Other Visit Diagnoses     History of TIA (transient ischemic attack)       Relevant Medications   clopidogrel (PLAVIX) 75 MG tablet   Evolocumab (REPATHA SURECLICK) 140 MG/ML SOAJ   Other Relevant Orders  Ambulatory referral to Neurology   Lipid panel       Meds ordered this encounter  Medications   clopidogrel (PLAVIX) 75 MG tablet     Sig: Take 1 tablet (75 mg total) by mouth daily.    Dispense:  90 tablet    Refill:  1   metFORMIN (GLUCOPHAGE) 1000 MG tablet    Sig: Take 1 tablet (1,000 mg total) by mouth 2 (two) times daily with a meal.    Dispense:  60 tablet    Refill:  3   NIFEdipine (PROCARDIA XL/NIFEDICAL XL) 60 MG 24 hr tablet    Sig: Take 1 tablet (60 mg total) by mouth daily.    Dispense:  90 tablet    Refill:  1   olmesartan-hydrochlorothiazide (BENICAR HCT) 40-25 MG tablet    Sig: Take 1 tablet by mouth daily.    Dispense:  90 tablet    Refill:  1   Evolocumab (REPATHA SURECLICK) 140 MG/ML SOAJ    Sig: Inject 140 mg into the skin every 14 (fourteen) days.    Dispense:  2 mL    Refill:  2   cloNIDine (CATAPRES) tablet 0.1 mg   cloNIDine (CATAPRES) tablet 0.1 mg    Follow-up: Return in about 4 weeks (around 09/24/2022) for HTN, HYPERLIPIDEMIA, FASTING LABS THIS WEEK.    Donell Beers, FNP

## 2022-08-27 NOTE — Assessment & Plan Note (Signed)
Continue Plavix 75 mg daily Patient referred to neurology to determine if patient should continue on medication

## 2022-08-27 NOTE — Assessment & Plan Note (Signed)
Statin intolerance cannot afford cost of Repatha.  Patient referred to the pharmacy for assistance

## 2022-08-27 NOTE — Assessment & Plan Note (Signed)
BP Readings from Last 3 Encounters:  08/27/22 (!) 166/78  05/19/22 (!) 143/83  05/07/22 (!) 142/77  Has been out of all his bp medications Medications refilled today Need to take all medications daily as prescribed discussed DASH diet advised engage in regular moderate exercises at least 150 minutes weekly Clonidine 0.2 mg one-time dose given Benicar 40-25 mg 1 tablet daily, nifedipine 60 mg daily refilled Patient referred to advanced hypertension clinic Encouraged to maintain close follow-up with Katie Follow-up in 4 weeks Urgent referral sent to the clinical pharmacist

## 2022-08-27 NOTE — Assessment & Plan Note (Addendum)
Lab Results  Component Value Date   HGBA1C 12.0 (A) 04/07/2022  A1c is much improved to 7.9 today However he has not been able to continue on Trulicity due to cost, will consider adding glipizide if Trulicity is not affordable for the patient Continue metformin 1000 mg twice daily Referral sent to the clinical pharmacist to assist with cost of medication Patient counseled on low-carb modified diet He was encouraged to pick his calls as to be reaching out to him

## 2022-09-24 ENCOUNTER — Other Ambulatory Visit (HOSPITAL_COMMUNITY): Payer: Self-pay

## 2022-09-24 ENCOUNTER — Other Ambulatory Visit: Payer: Self-pay

## 2022-09-24 ENCOUNTER — Encounter: Payer: Self-pay | Admitting: Nurse Practitioner

## 2022-09-24 ENCOUNTER — Ambulatory Visit (INDEPENDENT_AMBULATORY_CARE_PROVIDER_SITE_OTHER): Payer: 59 | Admitting: Nurse Practitioner

## 2022-09-24 VITALS — BP 141/85 | HR 87 | Resp 16 | Ht 65.0 in | Wt 143.6 lb

## 2022-09-24 DIAGNOSIS — Z8673 Personal history of transient ischemic attack (TIA), and cerebral infarction without residual deficits: Secondary | ICD-10-CM | POA: Diagnosis not present

## 2022-09-24 DIAGNOSIS — G720 Drug-induced myopathy: Secondary | ICD-10-CM

## 2022-09-24 DIAGNOSIS — E782 Mixed hyperlipidemia: Secondary | ICD-10-CM

## 2022-09-24 DIAGNOSIS — E785 Hyperlipidemia, unspecified: Secondary | ICD-10-CM

## 2022-09-24 DIAGNOSIS — I10 Essential (primary) hypertension: Secondary | ICD-10-CM

## 2022-09-24 DIAGNOSIS — E1169 Type 2 diabetes mellitus with other specified complication: Secondary | ICD-10-CM

## 2022-09-24 DIAGNOSIS — T466X5A Adverse effect of antihyperlipidemic and antiarteriosclerotic drugs, initial encounter: Secondary | ICD-10-CM

## 2022-09-24 HISTORY — DX: Drug-induced myopathy: G72.0

## 2022-09-24 MED ORDER — METFORMIN HCL 1000 MG PO TABS
1000.0000 mg | ORAL_TABLET | Freq: Two times a day (BID) | ORAL | 1 refills | Status: DC
Start: 2022-09-24 — End: 2022-10-11

## 2022-09-24 MED ORDER — REPATHA SURECLICK 140 MG/ML ~~LOC~~ SOAJ: 140.0000 mg | SUBCUTANEOUS | 2 refills | Status: DC

## 2022-09-24 MED ORDER — OLMESARTAN MEDOXOMIL-HCTZ 40-25 MG PO TABS
1.0000 | ORAL_TABLET | Freq: Every day | ORAL | 1 refills | Status: DC
Start: 2022-09-24 — End: 2022-10-11

## 2022-09-24 MED ORDER — NIFEDIPINE ER OSMOTIC RELEASE 60 MG PO TB24
60.0000 mg | ORAL_TABLET | Freq: Every day | ORAL | 1 refills | Status: DC
Start: 2022-09-24 — End: 2022-10-11

## 2022-09-24 MED ORDER — TRULICITY 0.75 MG/0.5ML ~~LOC~~ SOAJ: 0.7500 mg | SUBCUTANEOUS | 2 refills | Status: DC

## 2022-09-24 MED ORDER — CLOPIDOGREL BISULFATE 75 MG PO TABS
75.0000 mg | ORAL_TABLET | Freq: Every day | ORAL | 1 refills | Status: DC
Start: 2022-09-24 — End: 2022-10-11

## 2022-09-24 NOTE — Patient Instructions (Signed)

## 2022-09-24 NOTE — Progress Notes (Signed)
Established Patient Office Visit  Subjective:  Patient ID: Darrell [name], male    DOB: 08/30/1955  Age: 67 y.o. MRN: [id number]  CC:  Chief Complaint  Patient presents with   Medical Management of Chronic Issues    Patient ran out of procardia and hasn't been taking it. He doesn't take potassium everyday. Not checking blood sugars. A1 was 7.9 in July     HPI Darrell [name] is a 67 y.o. male  has a past medical history of Dyslipidemia, Essential hypertension, benign, Hyperlipidemia, Thyroid cyst, Type 2 diabetes mellitus (HCC), and Vitamin D deficiency.  Patient presents for follow-up for his chronic medical conditions Patient was told to bring all his current medications with him to his appointment today but the patient did not bring his medications.  Patient initially stated that he went to the pharmacy and he was told that he had no prescriptions ready except the prescription for metformin.  Upon further inquiry patient stated that he actually has no money to pay for his order prescriptions.  Prescriptions were sent to [facility], since they have plans for people unable to pay for their medications.   He has upcoming appointment with Catie the clinical pharmacist who has been assisting with getting Trulicity and Repatha.    Patient denies chest pain, shortness of breath, abdominal pain nausea vomiting.  Past Medical History:  Diagnosis Date   Dyslipidemia    Essential hypertension, benign    Hyperlipidemia    Thyroid cyst    Type 2 diabetes mellitus (HCC)    Vitamin D deficiency     Past Surgical History:  Procedure Laterality Date   COLONOSCOPY WITH PROPOFOL N/A 04/15/2021   Procedure: COLONOSCOPY WITH PROPOFOL;  Surgeon: Hilarie Fredrickson, MD;  Location: [facility];  Service: Gastroenterology;  Laterality: N/A;   ESOPHAGOGASTRODUODENOSCOPY (EGD) WITH PROPOFOL N/A 04/15/2021   Procedure: ESOPHAGOGASTRODUODENOSCOPY (EGD) WITH PROPOFOL;  Surgeon: Hilarie Fredrickson, MD;   Location: [facility];  Service: Gastroenterology;  Laterality: N/A;   MELANOMA EXCISION     Back   POLYPECTOMY  04/15/2021   Procedure: POLYPECTOMY;  Surgeon: Hilarie Fredrickson, MD;  Location: [facility];  Service: Gastroenterology;;   WISDOM TOOTH EXTRACTION      Family History  Problem Relation Age of Onset   Pulmonary embolism Mother    Diabetes Father    Hypertension Father    Alzheimer's disease Father    Diabetes Maternal Grandmother    Colon cancer Neg Hx     Social History   Socioeconomic History   Marital status: Single    Spouse name: Not on file   Number of children: 4   Years of education: Not on file   Highest education level: Not on file  Occupational History    Employer: [employer]  Tobacco Use   Smoking status: Never   Smokeless tobacco: Never  Vaping Use   Vaping status: Never Used  Substance and Sexual Activity   Alcohol use: Yes    Comment: occasional   Drug use: No   Sexual activity: Not on file  Other Topics Concern   Not on file  Social History Narrative   Lives home alone   Social Determinants of Health   Financial Resource Strain: Not on file  Food Insecurity: Not on file  Transportation Needs: Not on file  Physical Activity: Not on file  Stress: Not on file  Social Connections: Not on file  Intimate Partner Violence: Not on file  Outpatient Medications Prior to Visit  Medication Sig Dispense Refill   Multiple Vitamin (MULTIVITAMIN) tablet Take 1 tablet by mouth daily.     clopidogrel (PLAVIX) 75 MG tablet Take 1 tablet (75 mg total) by mouth daily. 90 tablet 1   metFORMIN (GLUCOPHAGE) 1000 MG tablet Take 1 tablet (1,000 mg total) by mouth 2 (two) times daily with a meal. 60 tablet 3   olmesartan-hydrochlorothiazide (BENICAR HCT) 40-25 MG tablet Take 1 tablet by mouth daily. 90 tablet 1   Continuous Blood Gluc Sensor (DEXCOM G7 SENSOR) MISC Use to check glucose continuously (Patient not taking: Reported on 08/27/2022) 3 each 11    glucose blood (TRUE METRIX BLOOD GLUCOSE TEST) test strip Use as instructed to check blood sugar twice daily.  (Patient not taking: Reported on 09/24/2022) 100 each 12   potassium chloride (KLOR-CON M) 10 MEQ tablet Take 1 tablet (10 mEq total) by mouth daily. (Patient not taking: Reported on 09/24/2022) 30 tablet 1   TRUEplus Lancets 28G MISC Use 2 (two) times daily at 8 am and 10 pm. (Patient not taking: Reported on 09/24/2022) 100 each 11   Dulaglutide (TRULICITY) 0.75 MG/0.5ML SOPN Inject 0.75 mg into the skin once a week. (Patient not taking: Reported on 08/27/2022) 2 mL 2   Evolocumab (REPATHA SURECLICK) 140 MG/ML SOAJ Inject 140 mg into the skin every 14 (fourteen) days. (Patient not taking: Reported on 09/24/2022) 2 mL 2   NIFEdipine (PROCARDIA XL/NIFEDICAL XL) 60 MG 24 hr tablet Take 1 tablet (60 mg total) by mouth daily. (Patient not taking: Reported on 09/24/2022) 90 tablet 1   No facility-administered medications prior to visit.    Allergies  Allergen Reactions   Statins     ROS Review of Systems  Constitutional:  Negative for activity change, appetite change, chills, diaphoresis, fatigue, fever and unexpected weight change.  HENT:  Negative for congestion, dental problem, drooling and ear discharge.   Respiratory:  Negative for apnea, cough, choking, chest tightness, shortness of breath and wheezing.   Cardiovascular: Negative.  Negative for chest pain, palpitations and leg swelling.  Gastrointestinal:  Negative for abdominal distention, abdominal pain, anal bleeding, blood in stool, constipation, diarrhea and vomiting.  Endocrine: Negative for polydipsia, polyphagia and polyuria.  Genitourinary:  Negative for difficulty urinating, flank pain, frequency and genital sores.  Musculoskeletal: Negative.  Negative for arthralgias, back pain, gait problem and joint swelling.  Skin:  Negative for color change, pallor and rash.  Neurological:  Negative for dizziness, facial asymmetry,  light-headedness, numbness and headaches.  Psychiatric/Behavioral:  Negative for agitation, behavioral problems, confusion, hallucinations, self-injury, sleep disturbance and suicidal ideas.       Objective:    Physical Exam Vitals and nursing note reviewed.  Constitutional:      General: He is not in acute distress.    Appearance: Normal appearance. He is not ill-appearing, toxic-appearing or diaphoretic.  HENT:     Mouth/Throat:     Mouth: Mucous membranes are moist.     Pharynx: Oropharynx is clear. No oropharyngeal exudate or posterior oropharyngeal erythema.  Eyes:     General: No scleral icterus.       Right eye: No discharge.        Left eye: No discharge.     Extraocular Movements: Extraocular movements intact.     Conjunctiva/sclera: Conjunctivae normal.  Cardiovascular:     Rate and Rhythm: Normal rate and regular rhythm.     Pulses: Normal pulses.     Heart sounds: Normal  heart sounds. No murmur heard.    No friction rub. No gallop.  Pulmonary:     Effort: Pulmonary effort is normal. No respiratory distress.     Breath sounds: Normal breath sounds. No stridor. No wheezing, rhonchi or rales.  Chest:     Chest wall: No tenderness.  Abdominal:     General: There is no distension.     Palpations: Abdomen is soft.     Tenderness: There is no abdominal tenderness. There is no right CVA tenderness, left CVA tenderness or guarding.  Musculoskeletal:        General: No swelling, tenderness, deformity or signs of injury.     Right lower leg: No edema.     Left lower leg: No edema.  Skin:    General: Skin is warm and dry.     Capillary Refill: Capillary refill takes less than 2 seconds.     Coloration: Skin is not jaundiced or pale.     Findings: No bruising, erythema or lesion.  Neurological:     Mental Status: He is alert and oriented to person, place, and time.     Motor: No weakness.     Coordination: Coordination normal.     Gait: Gait normal.  Psychiatric:         Mood and Affect: Mood normal.        Behavior: Behavior normal.        Thought Content: Thought content normal.        Judgment: Judgment normal.     BP (!) 141/85   Pulse 87   Resp 16   Ht 5\' 5"  (1.651 m)   Wt 143 lb 9.6 oz (65.1 kg)   SpO2 97%   BMI 23.90 kg/m  Wt Readings from Last 3 Encounters:  09/24/22 143 lb 9.6 oz (65.1 kg)  08/27/22 146 lb 12.8 oz (66.6 kg)  05/19/22 144 lb 3.2 oz (65.4 kg)    Lab Results  Component Value Date   TSH 1.310 04/07/2022   Lab Results  Component Value Date   WBC 8.0 04/07/2022   HGB 15.0 04/07/2022   HCT 42.8 04/07/2022   MCV 93 04/07/2022   PLT 200 04/07/2022   Lab Results  Component Value Date   NA 142 08/27/2022   K 4.0 08/27/2022   CO2 26 08/27/2022   GLUCOSE 189 (H) 08/27/2022   BUN 12 08/27/2022   CREATININE 0.83 08/27/2022   BILITOT 0.6 08/27/2022   ALKPHOS 56 08/27/2022   AST 12 08/27/2022   ALT 10 08/27/2022   PROT 6.7 08/27/2022   ALBUMIN 4.5 08/27/2022   CALCIUM 9.7 08/27/2022   ANIONGAP 9 04/15/2021   EGFR 98 08/27/2022   Lab Results  Component Value Date   CHOL 298 (H) 04/07/2022   Lab Results  Component Value Date   HDL 43 04/07/2022   Lab Results  Component Value Date   LDLCALC 194 (H) 04/07/2022   Lab Results  Component Value Date   TRIG 308 (H) 04/07/2022   Lab Results  Component Value Date   CHOLHDL 6.9 (H) 04/07/2022   Lab Results  Component Value Date   HGBA1C 7.9 (A) 08/27/2022   HGBA1C 7.9 08/27/2022      Assessment & Plan:   Problem List Items Addressed This Visit       Cardiovascular and Mediastinum   Hypertension - Primary    BP Readings from Last 3 Encounters:  09/24/22 (!) 141/85  08/27/22 (!) 171/103  05/19/22 Marland Kitchen)  143/83  Blood pressure uncontrolled because the patient has not been taking his medications due to not being able to afford cost of meds Medication sent to [facility] pharmacy today, patient to to go to the pharmacy and pick up the medication and  take them as ordered.  Encouraged to keep upcoming appointment with Catie our clinical pharmacist. Patient is stable condition      Relevant Medications   Evolocumab (REPATHA SURECLICK) 140 MG/ML SOAJ   NIFEdipine (PROCARDIA XL/NIFEDICAL XL) 60 MG 24 hr tablet   olmesartan-hydrochlorothiazide (BENICAR HCT) 40-25 MG tablet     Endocrine   Type 2 diabetes mellitus with hyperlipidemia (HCC)    Lab Results  Component Value Date   HGBA1C 7.9 (A) 08/27/2022   HGBA1C 7.9 08/27/2022  Takes only metformin 1000 mg twice daily Was getting Trulicity but stated that the cost of Trulicity has gone all Patient encouraged to keep his upcoming appointment with the clinical pharmacist who has been assisting with cost of medication      Relevant Medications   Dulaglutide (TRULICITY) 0.75 MG/0.5ML SOPN   Evolocumab (REPATHA SURECLICK) 140 MG/ML SOAJ   metFORMIN (GLUCOPHAGE) 1000 MG tablet   NIFEdipine (PROCARDIA XL/NIFEDICAL XL) 60 MG 24 hr tablet   olmesartan-hydrochlorothiazide (BENICAR HCT) 40-25 MG tablet     Musculoskeletal and Integument   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  Encouraged to keep the appointment      Relevant Medications   Evolocumab (REPATHA SURECLICK) 140 MG/ML SOAJ   NIFEdipine (PROCARDIA XL/NIFEDICAL XL) 60 MG 24 hr tablet   olmesartan-hydrochlorothiazide (BENICAR HCT) 40-25 MG tablet   History of TIA (transient ischemic attack)    LDL goal is less than 55, takes Plavix 75 mg daily Has repatha ordered but not taking medication due to cost  Has upcoming appointment with the clinical pharmacist  The 10-year ASCVD risk score (Arnett DK, et al., 2019) is: 36.7%   Values used to calculate the score:     Age: 8 years     Sex: Male     Is Non-Hispanic African American: Yes     Diabetic: Yes     Tobacco smoker: No      Systolic Blood Pressure: 141 mmHg     Is BP treated: Yes     HDL Cholesterol: 43 mg/dL     Total Cholesterol: 298 mg/dL       Relevant Medications   clopidogrel (PLAVIX) 75 MG tablet   Evolocumab (REPATHA SURECLICK) 140 MG/ML SOAJ    Meds ordered this encounter  Medications   clopidogrel (PLAVIX) 75 MG tablet    Sig: Take 1 tablet (75 mg total) by mouth daily.    Dispense:  90 tablet    Refill:  1   Dulaglutide (TRULICITY) 0.75 MG/0.5ML SOPN    Sig: Inject 0.75 mg into the skin once a week.    Dispense:  2 mL    Refill:  2   Evolocumab (REPATHA SURECLICK) 140 MG/ML SOAJ    Sig: Inject 140 mg into the skin every 14 (fourteen) days.    Dispense:  2 mL    Refill:  2   metFORMIN (GLUCOPHAGE) 1000 MG tablet    Sig: Take 1 tablet (1,000 mg total) by mouth 2 (two) times daily with a meal.    Dispense:  180 tablet  Refill:  1   NIFEdipine (PROCARDIA XL/NIFEDICAL XL) 60 MG 24 hr tablet    Sig: Take 1 tablet (60 mg total) by mouth daily.    Dispense:  90 tablet    Refill:  1   olmesartan-hydrochlorothiazide (BENICAR HCT) 40-25 MG tablet    Sig: Take 1 tablet by mouth daily.    Dispense:  90 tablet    Refill:  1    Follow-up: Return in about 4 weeks (around 10/22/2022) for HTN, DM.    Donell Beers, FNP

## 2022-09-24 NOTE — Assessment & Plan Note (Signed)
LDL goal is less than 55, takes Plavix 75 mg daily Has repatha ordered but not taking medication due to cost  Has upcoming appointment with the clinical pharmacist  The 10-year ASCVD risk score (Arnett DK, et al., 2019) is: 36.7%   Values used to calculate the score:     Age: 67 years     Sex: Male     Is Non-Hispanic African American: Yes     Diabetic: Yes     Tobacco smoker: No     Systolic Blood Pressure: 141 mmHg     Is BP treated: Yes     HDL Cholesterol: 43 mg/dL     Total Cholesterol: 298 mg/dL

## 2022-09-24 NOTE — Assessment & Plan Note (Signed)
Has not picked up prescription for Repatha due to cost of medication Has upcoming appointment with the clinical pharmacist.  Encouraged to keep the appointment

## 2022-09-24 NOTE — Assessment & Plan Note (Signed)
BP Readings from Last 3 Encounters:  09/24/22 (!) 141/85  08/27/22 (!) 171/103  05/19/22 (!) 143/83  Blood pressure uncontrolled because the patient has not been taking his medications due to not being able to afford cost of meds Medication sent to [facility] pharmacy today, patient to to go to the pharmacy and pick up the medication and take them as ordered.  Encouraged to keep upcoming appointment with Catie our clinical pharmacist. Patient is stable condition

## 2022-09-24 NOTE — Assessment & Plan Note (Signed)
Lab Results  Component Value Date   HGBA1C 7.9 (A) 08/27/2022   HGBA1C 7.9 08/27/2022  Takes only metformin 1000 mg twice daily Was getting Trulicity but stated that the cost of Trulicity has gone all Patient encouraged to keep his upcoming appointment with the clinical pharmacist who has been assisting with cost of medication

## 2022-10-11 ENCOUNTER — Other Ambulatory Visit (HOSPITAL_COMMUNITY): Payer: Self-pay

## 2022-10-11 ENCOUNTER — Other Ambulatory Visit: Payer: 59 | Admitting: Pharmacist

## 2022-10-11 DIAGNOSIS — Z8673 Personal history of transient ischemic attack (TIA), and cerebral infarction without residual deficits: Secondary | ICD-10-CM

## 2022-10-11 DIAGNOSIS — E782 Mixed hyperlipidemia: Secondary | ICD-10-CM

## 2022-10-11 DIAGNOSIS — E1169 Type 2 diabetes mellitus with other specified complication: Secondary | ICD-10-CM

## 2022-10-11 DIAGNOSIS — I10 Essential (primary) hypertension: Secondary | ICD-10-CM

## 2022-10-11 MED ORDER — METFORMIN HCL 1000 MG PO TABS
1000.0000 mg | ORAL_TABLET | Freq: Two times a day (BID) | ORAL | 1 refills | Status: DC
Start: 2022-10-11 — End: 2022-12-28
  Filled 2022-10-11 – 2022-10-12 (×2): qty 60, 30d supply, fill #0
  Filled 2022-10-25 (×2): qty 180, 90d supply, fill #0

## 2022-10-11 MED ORDER — REPATHA SURECLICK 140 MG/ML ~~LOC~~ SOAJ
140.0000 mg | SUBCUTANEOUS | 2 refills | Status: DC
Start: 2022-10-11 — End: 2022-11-15
  Filled 2022-10-11 – 2022-10-25 (×3): qty 2, 28d supply, fill #0

## 2022-10-11 MED ORDER — OLMESARTAN MEDOXOMIL-HCTZ 40-25 MG PO TABS
1.0000 | ORAL_TABLET | Freq: Every day | ORAL | 1 refills | Status: DC
Start: 2022-10-11 — End: 2023-02-15
  Filled 2022-10-11 (×2): qty 30, 30d supply, fill #0
  Filled 2022-10-25 (×2): qty 90, 90d supply, fill #0
  Filled 2023-02-15: qty 90, 90d supply, fill #1

## 2022-10-11 MED ORDER — TRULICITY 0.75 MG/0.5ML ~~LOC~~ SOAJ
0.7500 mg | SUBCUTANEOUS | 2 refills | Status: DC
  Filled 2022-10-11: qty 2, 28d supply, fill #0

## 2022-10-11 MED ORDER — NIFEDIPINE ER OSMOTIC RELEASE 60 MG PO TB24
60.0000 mg | ORAL_TABLET | Freq: Every day | ORAL | 1 refills | Status: DC
Start: 2022-10-11 — End: 2022-12-17
  Filled 2022-10-11 – 2022-10-25 (×4): qty 30, 30d supply, fill #0

## 2022-10-11 MED ORDER — OZEMPIC (0.25 OR 0.5 MG/DOSE) 2 MG/3ML ~~LOC~~ SOPN
0.5000 mg | PEN_INJECTOR | SUBCUTANEOUS | 2 refills | Status: DC
  Filled 2022-10-11 – 2022-10-12 (×2): qty 3, 28d supply, fill #0

## 2022-10-11 MED ORDER — CLOPIDOGREL BISULFATE 75 MG PO TABS
75.0000 mg | ORAL_TABLET | Freq: Every day | ORAL | 1 refills | Status: DC
Start: 2022-10-11 — End: 2022-11-15
  Filled 2022-10-11 (×2): qty 30, 30d supply, fill #0
  Filled 2022-10-25 (×2): qty 90, 90d supply, fill #0

## 2022-10-11 NOTE — Progress Notes (Signed)
10/11/2022 Name: Darrell [name] MRN: [id number] DOB: Apr 21, 1955  Chief Complaint  Patient presents with   Medication Management   Diabetes    Darrell [name] is a 67 y.o. year old male who presented for a telephone visit.   They were referred to the pharmacist by their PCP for assistance in managing diabetes.    Subjective:  Care Team: Primary Care Provider: Donell Beers, FNP ; Next Scheduled Visit: 10/22/22  Medication Access/Adherence  Current Pharmacy:  [facility] [address] [place] [address] Phone: [phone] Fax: [phone]   Patient reports affordability concerns with their medications: Yes  Patient reports access/transportation concerns to their pharmacy: Yes  Patient reports adherence concerns with their medications:  Yes    Reports he has been without Repatha, Trulicity for several weeks; without clopidogrel, nifedipine for a while as well   Diabetes:  Current medications: prescribed Trulicity 0.75 mg weekly, metformin 1000 mg twice daily. Has had metformin at home, but ran out of Trulicity several weeks ago.   Discussed with patient today that if we need to pursue patient assistance from manufacturer, Trulicity program is closed. We could pursue Ozempic assistance. However, patient has concerns about potential weight loss associated with Ozempic.   Hypertension:  Current medications: olmesartan/hydrochlorothiazide 40/25 mg daily, nifedipine XL 60 mg daily   Hyperlipidemia/ASCVD Risk Reduction  Current lipid lowering medications: Repatha 140 mg every 14 days  Prior agents: atorvastatin, rosuvastatin - myalgias   Antiplatelet regimen: clopidogrel 75 mg daily    Objective:  Lab Results  Component Value Date   HGBA1C 7.9 (A) 08/27/2022   HGBA1C 7.9 08/27/2022    Lab Results  Component Value Date   CREATININE 0.83 08/27/2022   BUN 12 08/27/2022   NA 142 08/27/2022   K 4.0 08/27/2022   CL 104  08/27/2022   CO2 26 08/27/2022    Lab Results  Component Value Date   CHOL 298 (H) 04/07/2022   HDL 43 04/07/2022   LDLCALC 194 (H) 04/07/2022   TRIG 308 (H) 04/07/2022   CHOLHDL 6.9 (H) 04/07/2022    Medications Reviewed Today     Reviewed by Alden Hipp, RPH-CPP (Pharmacist) on 10/11/22 at 1501  Med List Status: <None>   Medication Order Taking? Sig Documenting Provider Last Dose Status Informant  clopidogrel (PLAVIX) 75 MG tablet [id number] No Take 1 tablet (75 mg total) by mouth daily.  Patient not taking: Reported on 10/11/2022   Donell Beers, FNP Not Taking Active   Continuous Blood Gluc Sensor (DEXCOM G7 SENSOR) MISC [id number] No Use to check glucose continuously  Patient not taking: Reported on 08/27/2022   Donell Beers, FNP Not Taking Active   Dulaglutide (TRULICITY) 0.75 MG/0.5ML SOPN [id number] Yes Inject 0.75 mg into the skin once a week. Donell Beers, FNP Taking Active   Evolocumab (REPATHA SURECLICK) 140 MG/ML SOAJ [id number] No Inject 140 mg into the skin every 14 (fourteen) days.  Patient not taking: Reported on 10/11/2022   Donell Beers, FNP Not Taking Active   glucose blood (TRUE METRIX BLOOD GLUCOSE TEST) test strip [id number]  Use as instructed to check blood sugar twice daily.   Patient not taking: Reported on 09/24/2022   Mayers, Cari S, PA-C  Active   metFORMIN (GLUCOPHAGE) 1000 MG tablet [id number] Yes Take 1 tablet (1,000 mg total) by mouth 2 (two) times daily with a meal. Paseda, Baird Kay, FNP Taking Active   Multiple Vitamin (  MULTIVITAMIN) tablet [id number]  Take 1 tablet by mouth daily. [provider]  Active Self  NIFEdipine (PROCARDIA XL/NIFEDICAL XL) 60 MG 24 hr tablet [id number] No Take 1 tablet (60 mg total) by mouth daily.  Patient not taking: Reported on 10/11/2022   Donell Beers, FNP Not Taking Active   olmesartan-hydrochlorothiazide (BENICAR HCT) 40-25 MG tablet [id number] No Take 1 tablet by mouth daily.   Patient not taking: Reported on 10/11/2022   Donell Beers, FNP Not Taking Active   potassium chloride (KLOR-CON M) 10 MEQ tablet [id number] No Take 1 tablet (10 mEq total) by mouth daily.  Patient not taking: Reported on 09/24/2022   Diona Foley Not Taking Active   TRUEplus Lancets 28G MISC [id number] No Use 2 (two) times daily at 8 am and 10 pm.  Patient not taking: Reported on 09/24/2022   Mayers, Cari S, PA-C Not Taking Active               Assessment/Plan:   Diabetes: - Currently uncontrolled but anticipate improvement with medication access - Discussed income. Patient is over income for Medicaid and Medicare Extra Help, but would qualify for GLP1 assistance. Trulicity assistance is currently closed, could consider Ozempic. Patient notes he is not interested in weight loss. Completed PA for Ozempic on both [address] and [employer]. Denied on [employer]. Checked price for Trulicity - copay >$900 due to [employer]. Trulicity on [facility] is $45/month. Patient notes he cannot afford this. Discussed options for GLP1 patient assistance. Given patient desire to avoid weight loss, recommend to pursue assistance for [employer]bels[employer] through [employer].   Hypertension: - Currently uncontrolled due to medication access - Collaborated with [facility]. They will mail medication to patient.  - Will discuss home blood pressure readings moving forward.   Hyperlipidemia/ASCVD Risk Reduction: - Currently uncontrolled due to medication access - Collaborated with [facility]. They will mail medication to patient. Patient notes he can afford $24.99 copay  - Continue current regimen at this time  Follow Up Plan: phone call in 4 weeks  Catie TClearance Coots, PharmD, BCACP, CPP Clinical Pharmacist [facility] [phone]

## 2022-10-12 ENCOUNTER — Encounter (HOSPITAL_BASED_OUTPATIENT_CLINIC_OR_DEPARTMENT_OTHER): Payer: Self-pay | Admitting: Cardiovascular Disease

## 2022-10-12 ENCOUNTER — Encounter: Payer: Self-pay | Admitting: Pharmacist

## 2022-10-12 ENCOUNTER — Other Ambulatory Visit (HOSPITAL_COMMUNITY): Payer: Self-pay

## 2022-10-12 ENCOUNTER — Ambulatory Visit (INDEPENDENT_AMBULATORY_CARE_PROVIDER_SITE_OTHER): Payer: Medicare HMO | Admitting: Cardiovascular Disease

## 2022-10-12 ENCOUNTER — Other Ambulatory Visit: Payer: Self-pay

## 2022-10-12 VITALS — BP 184/94 | HR 75 | Ht 65.0 in | Wt 147.1 lb

## 2022-10-12 DIAGNOSIS — G459 Transient cerebral ischemic attack, unspecified: Secondary | ICD-10-CM | POA: Diagnosis not present

## 2022-10-12 DIAGNOSIS — E782 Mixed hyperlipidemia: Secondary | ICD-10-CM

## 2022-10-12 DIAGNOSIS — I10 Essential (primary) hypertension: Secondary | ICD-10-CM

## 2022-10-12 DIAGNOSIS — Z006 Encounter for examination for normal comparison and control in clinical research program: Secondary | ICD-10-CM

## 2022-10-12 NOTE — Addendum Note (Signed)
Addended by: Chilton Si C on: 10/12/2022 07:50 PM   Modules accepted: Orders

## 2022-10-12 NOTE — Patient Instructions (Addendum)
Medication Instructions:  HAVE CALLED AND CONFIRMED YOUR PRESCRIPTIONS WILL BE SHIPPED   Labwork: RENIN/ALDOSTERONE LEVEL TODAY    Testing/Procedures: Your physician has requested that you have a renal artery duplex. During this test, an ultrasound is used to evaluate blood flow to the kidneys. Allow one hour for this exam. Do not eat after midnight the day before and avoid carbonated beverages. Take your medications as you usually do.   Follow-Up: 2 MONTHS WITH PHARM D   4 MONTHS WITH DR Henry IN [facility]    Special Instructions:   MONITOR YOUR BLOOD PRESSURE TWICE A DAY WITH MACHINE PROVIDED. MAKE SURE YOU ARE LOGGED INTO YOUR VIVIFY APP WHEN CHECKING   DASH Eating Plan DASH stands for "Dietary Approaches to Stop Hypertension." The DASH eating plan is a healthy eating plan that has been shown to reduce high blood pressure (hypertension). It may also reduce your risk for type 2 diabetes, heart disease, and stroke. The DASH eating plan may also help with weight loss. What are tips for following this plan?  General guidelines Avoid eating more than 2,300 mg (milligrams) of salt (sodium) a day. If you have hypertension, you may need to reduce your sodium intake to 1,500 mg a day. Limit alcohol intake to no more than 1 drink a day for nonpregnant women and 2 drinks a day for men. One drink equals 12 oz of beer, 5 oz of wine, or 1 oz of hard liquor. Work with your health care provider to maintain a healthy body weight or to lose weight. Ask what an ideal weight is for you. Get at least 30 minutes of exercise that causes your heart to beat faster (aerobic exercise) most days of the week. Activities may include walking, swimming, or biking. Work with your health care provider or diet and nutrition specialist (dietitian) to adjust your eating plan to your individual calorie needs. Reading food labels  Check food labels for the amount of sodium per serving. Choose foods with less than  5 percent of the Daily Value of sodium. Generally, foods with less than 300 mg of sodium per serving fit into this eating plan. To find whole grains, look for the word "whole" as the first word in the ingredient list. Shopping Buy products labeled as "low-sodium" or "no salt added." Buy fresh foods. Avoid canned foods and premade or frozen meals. Cooking Avoid adding salt when cooking. Use salt-free seasonings or herbs instead of table salt or sea salt. Check with your health care provider or pharmacist before using salt substitutes. Do not fry foods. Cook foods using healthy methods such as baking, boiling, grilling, and broiling instead. Cook with heart-healthy oils, such as olive, canola, soybean, or sunflower oil. Meal planning Eat a balanced diet that includes: 5 or more servings of fruits and vegetables each day. At each meal, try to fill half of your plate with fruits and vegetables. Up to 6-8 servings of whole grains each day. Less than 6 oz of lean meat, poultry, or fish each day. A 3-oz serving of meat is about the same size as a deck of cards. One egg equals 1 oz. 2 servings of low-fat dairy each day. A serving of nuts, seeds, or beans 5 times each week. Heart-healthy fats. Healthy fats called Omega-3 fatty acids are found in foods such as flaxseeds and coldwater fish, like sardines, salmon, and mackerel. Limit how much you eat of the following: Canned or prepackaged foods. Food that is high in trans fat,  such as fried foods. Food that is high in saturated fat, such as fatty meat. Sweets, desserts, sugary drinks, and other foods with added sugar. Full-fat dairy products. Do not salt foods before eating. Try to eat at least 2 vegetarian meals each week. Eat more home-cooked food and less restaurant, buffet, and fast food. When eating at a restaurant, ask that your food be prepared with less salt or no salt, if possible. What foods are recommended? The items listed may not be a  complete list. Talk with your dietitian about what dietary choices are best for you. Grains Whole-grain or whole-wheat bread. Whole-grain or whole-wheat pasta. Brown rice. Orpah Cobb. Bulgur. Whole-grain and low-sodium cereals. Pita bread. Low-fat, low-sodium crackers. Whole-wheat flour tortillas. Vegetables Fresh or frozen vegetables (raw, steamed, roasted, or grilled). Low-sodium or reduced-sodium tomato and vegetable juice. Low-sodium or reduced-sodium tomato sauce and tomato paste. Low-sodium or reduced-sodium canned vegetables. Fruits All fresh, dried, or frozen fruit. Canned fruit in natural juice (without added sugar). Meat and other protein foods Skinless chicken or [place]. Ground chicken or [place]. Pork with fat trimmed off. Fish and seafood. Egg whites. Dried beans, peas, or lentils. Unsalted nuts, nut butters, and seeds. Unsalted canned beans. Lean cuts of beef with fat trimmed off. Low-sodium, lean deli meat. Dairy Low-fat (1%) or fat-free (skim) milk. Fat-free, low-fat, or reduced-fat cheeses. Nonfat, low-sodium ricotta or cottage cheese. Low-fat or nonfat yogurt. Low-fat, low-sodium cheese. Fats and oils Soft margarine without trans fats. Vegetable oil. Low-fat, reduced-fat, or light mayonnaise and salad dressings (reduced-sodium). Canola, safflower, olive, soybean, and sunflower oils. Avocado. Seasoning and other foods Herbs. Spices. Seasoning mixes without salt. Unsalted popcorn and pretzels. Fat-free sweets. What foods are not recommended? The items listed may not be a complete list. Talk with your dietitian about what dietary choices are best for you. Grains Baked goods made with fat, such as croissants, muffins, or some breads. Dry pasta or rice meal packs. Vegetables Creamed or fried vegetables. Vegetables in a cheese sauce. Regular canned vegetables (not low-sodium or reduced-sodium). Regular canned tomato sauce and paste (not low-sodium or reduced-sodium). Regular  tomato and vegetable juice (not low-sodium or reduced-sodium). Rosita Fire. Olives. Fruits Canned fruit in a light or heavy syrup. Fried fruit. Fruit in cream or butter sauce. Meat and other protein foods Fatty cuts of meat. Ribs. Fried meat. Tomasa Blase. Sausage. Bologna and other processed lunch meats. Salami. Fatback. Hotdogs. Bratwurst. Salted nuts and seeds. Canned beans with added salt. Canned or smoked fish. Whole eggs or egg yolks. Chicken or [place] with skin. Dairy Whole or 2% milk, cream, and half-and-half. Whole or full-fat cream cheese. Whole-fat or sweetened yogurt. Full-fat cheese. Nondairy creamers. Whipped toppings. Processed cheese and cheese spreads. Fats and oils Butter. Stick margarine. Lard. Shortening. Ghee. Bacon fat. Tropical oils, such as coconut, palm kernel, or palm oil. Seasoning and other foods Salted popcorn and pretzels. Onion salt, garlic salt, seasoned salt, table salt, and sea salt. Worcestershire sauce. Tartar sauce. Barbecue sauce. Teriyaki sauce. Soy sauce, including reduced-sodium. Steak sauce. Canned and packaged gravies. Fish sauce. Oyster sauce. Cocktail sauce. Horseradish that you find on the shelf. Ketchup. Mustard. Meat flavorings and tenderizers. Bouillon cubes. Hot sauce and Tabasco sauce. Premade or packaged marinades. Premade or packaged taco seasonings. Relishes. Regular salad dressings. Where to find more information: National Heart, Lung, and Blood Institute: PopSteam.is American Heart Association: www.heart.org Summary The DASH eating plan is a healthy eating plan that has been shown to reduce high blood pressure (hypertension). It may also  reduce your risk for type 2 diabetes, heart disease, and stroke. With the DASH eating plan, you should limit salt (sodium) intake to 2,300 mg a day. If you have hypertension, you may need to reduce your sodium intake to 1,500 mg a day. When on the DASH eating plan, aim to eat more fresh fruits and vegetables, whole  grains, lean proteins, low-fat dairy, and heart-healthy fats. Work with your health care provider or diet and nutrition specialist (dietitian) to adjust your eating plan to your individual calorie needs. This information is not intended to replace advice given to you by your health care provider. Make sure you discuss any questions you have with your health care provider. Document Released: 01/07/2011 Document Revised: 12/31/2016 Document Reviewed: 01/12/2016 Elsevier Patient Education  2020 [employer].

## 2022-10-12 NOTE — Progress Notes (Addendum)
[facility] Initial Assessment:    Date:  10/12/2022   ID:  Darrell [name], DOB 1955-12-26, MRN [id number]  PCP:  Donell Beers, FNP  Cardiologist:  None  Nephrologist:  Referring MD: Donell Beers, FNP   CC: Hypertension  History of Present Illness:    Darrell [name] is a 67 y.o. male with a hx of TIA, hypertension, hyperlipidemia, diabetes, vitamin D deficiency, thyroid cyst, here to establish care in the [facility]. He was seen by his PCP 08/27/2022 and his blood pressure was elevated to 171/103. He had run out of his medications for several weeks (nifedipine 60 mg daily, olmesartan-HCTZ 40-25 mg daily). He was given a one-time dose of clonidine 0.2 mg and his other medications were refilled. Noted to have a history of TIA and had been on Plavix 75 mg. At his follow-up with his PCP 09/2022 his blood pressure was 141/85. He was not taking medications consistently due to being unable to afford them. His medications were sent to the [facility]. Previously had an echocardiogram 07/2018 showing LVEF 60-65%, severely increased left ventricular wall thickness, and diastolic parameters consistent with impaired relaxation. Mean left atrial pressure was elevated. No valvular abnormalities.  Today, he states he has had high blood pressure since he was in his 40's. He notes that it has been difficult to control since he stopped exercising routinely in 2021. Over a year, he lost weight from about 189 lbs to 146 lbs, and then fell out of the habit of exercising. He is planning on returning to the gym soon for exercise. In the office his blood pressure is elevated to 168/104 initially, and 184/94 on manual recheck. His home readings are mostly closer to 150s/75-88. He will take his medications first thing in the morning. He is monitoring sodium intake at home. If he does use salt, he will use Himalayan pink salt or sea salt. The only caffeinated beverages he  consumes are [employer], one a day at most. Alcohol consumption is occasional. He does not use pain medication. Current supplements include One-A-Day gummies and CoQ10. He does snore, but feels rested in the mornings. If he feels bored, he may fall asleep easily during the day. He confirms that in 2020, he suffered a mild stroke. At the time he woke up with blurry vision and some aphasia. He had started taking 81 mg ASA a few weeks prior to his stroke as he began experiencing some chest pain and left arm pain. Today he denies any issues with chest pain or arm pain. He denies any palpitations, shortness of breath, peripheral edema, lightheadedness, headaches, syncope, orthopnea, or PND.  Previous antihypertensives: N/a  Past Medical History:  Diagnosis Date   Acute blood loss anemia 04/14/2021   Benign neoplasm of sigmoid colon    Benign prostatic hyperplasia without lower urinary tract symptoms 06/08/2017   Diverticulosis of colon with hemorrhage    Dyslipidemia    Esophageal stricture    Essential hypertension, benign    Expressive aphasia 07/14/2018   Gastroesophageal reflux disease with esophagitis without hemorrhage    History of TIA (transient ischemic attack) 04/14/2021   Hyperlipidemia    Hypertension 01/30/2015   Hypertensive emergency 07/14/2018   Prolonged QT interval 07/14/2018   RBBB    Statin intolerance 08/27/2022   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, 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] 09/24/2022   Thyroid cyst    TIA (transient ischemic attack) 07/14/2018   Type 2 diabetes mellitus (HCC)  Type 2 diabetes mellitus with hyperlipidemia (HCC) 01/30/2015   Vitamin D deficiency     Past Surgical History:  Procedure Laterality Date   COLONOSCOPY WITH PROPOFOL N/A 04/15/2021   Procedure: COLONOSCOPY WITH PROPOFOL;  Surgeon: Hilarie Fredrickson, MD;  Location: [facility];  Service: Gastroenterology;  Laterality: N/A;   ESOPHAGOGASTRODUODENOSCOPY (EGD) WITH PROPOFOL N/A 04/15/2021   Procedure:  ESOPHAGOGASTRODUODENOSCOPY (EGD) WITH PROPOFOL;  Surgeon: Hilarie Fredrickson, MD;  Location: [facility];  Service: Gastroenterology;  Laterality: N/A;   MELANOMA EXCISION     Back   POLYPECTOMY  04/15/2021   Procedure: POLYPECTOMY;  Surgeon: Hilarie Fredrickson, MD;  Location: [facility];  Service: Gastroenterology;;   WISDOM TOOTH EXTRACTION      Current Medications: Current Meds  Medication Sig   clopidogrel (PLAVIX) 75 MG tablet Take 1 tablet (75 mg total) by mouth daily.   Continuous Blood Gluc Sensor (DEXCOM G7 SENSOR) MISC Use to check glucose continuously   Evolocumab (REPATHA SURECLICK) 140 MG/ML SOAJ Inject 140 mg into the skin every 14 (fourteen) days.   glucose blood (TRUE METRIX BLOOD GLUCOSE TEST) test strip Use as instructed to check blood sugar twice daily.    metFORMIN (GLUCOPHAGE) 1000 MG tablet Take 1 tablet (1,000 mg total) by mouth 2 (two) times daily with a meal.   Multiple Vitamin (MULTIVITAMIN) tablet Take 1 tablet by mouth daily.   NIFEdipine (PROCARDIA XL/NIFEDICAL XL) 60 MG 24 hr tablet Take 1 tablet (60 mg total) by mouth daily.   olmesartan-hydrochlorothiazide (BENICAR HCT) 40-25 MG tablet Take 1 tablet by mouth daily.   Semaglutide,0.25 or 0.5MG /DOS, (OZEMPIC, 0.25 OR 0.5 MG/DOSE,) 2 MG/3ML SOPN Inject 0.5 mg into the skin once a week.   TRUEplus Lancets 28G MISC Use 2 (two) times daily at 8 am and 10 pm.     Allergies:   Statins   Social History   Socioeconomic History   Marital status: Single    Spouse name: Not on file   Number of children: 4   Years of education: Not on file   Highest education level: Not on file  Occupational History    Employer: [employer]  Tobacco Use   Smoking status: Never   Smokeless tobacco: Never  Vaping Use   Vaping status: Never Used  Substance and Sexual Activity   Alcohol use: Yes    Comment: occasional   Drug use: No   Sexual activity: Not on file  Other Topics Concern   Not on file  Social History Narrative   Lives  home alone   Social Determinants of Health   Financial Resource Strain: Not on file  Food Insecurity: No Food Insecurity (10/12/2022)   Hunger Vital Sign    Worried About Running Out of Food in the Last Year: Never true    Ran Out of Food in the Last Year: Never true  Transportation Needs: No Transportation Needs (10/12/2022)   PRAPARE - Administrator, Civil Service (Medical): No    Lack of Transportation (Non-Medical): No  Physical Activity: Inactive (10/12/2022)   Exercise Vital Sign    Days of Exercise per Week: 0 days    Minutes of Exercise per Session: 0 min  Stress: Not on file  Social Connections: Not on file     Family History: The patient's family history includes Alzheimer's disease in his brother and father; Diabetes in his father and maternal grandmother; Heart disease in his brother; Hypertension in his father and mother; Pulmonary embolism in his  mother. There is no history of Colon cancer.  ROS:   Please see the history of present illness.    (+) Snoring All other systems reviewed and are negative.  EKGs/Labs/Other Studies Reviewed:    Echo  07/15/2018: Sonographer Comments: Respiratory motion.  IMPRESSIONS   1. The left ventricle has normal systolic function with an ejection  fraction of 60-65%. The cavity size was normal. There is severely  increased left ventricular wall thickness. Left ventricular diastolic  Doppler parameters are consistent with impaired  relaxation. Elevated mean left atrial pressure.   2. The right ventricle has normal systolic function. The cavity was  normal. There is no increase in right ventricular wall thickness.   3. No evidence of mitral valve stenosis.   4. The aortic valve is tricuspid. No stenosis of the aortic valve.   5. The aortic root is normal in size and structure.   6. Pulmonary hypertension is indeterminant, inadequate TR jet.   7. The interatrial septum was not well visualized.   EKG:  EKG is personally  reviewed. 10/12/2022: Sinus rhythm. Rate 75 bpm.  RBBB.  LAD.  Lateral TWI  Recent Labs: 04/07/2022: Hemoglobin 15.0; Platelets 200; TSH 1.310 08/27/2022: ALT 10; BUN 12; Creatinine, Ser 0.83; Potassium 4.0; Sodium 142   Recent Lipid Panel    Component Value Date/Time   CHOL 298 (H) 04/07/2022 1701   TRIG 308 (H) 04/07/2022 1701   HDL 43 04/07/2022 1701   CHOLHDL 6.9 (H) 04/07/2022 1701   CHOLHDL 2.5 07/15/2018 1115   VLDL 12 07/15/2018 1115   LDLCALC 194 (H) 04/07/2022 1701    Physical Exam:    VS:  BP (!) 184/94 (BP Location: Right Arm, Patient Position: Sitting, Cuff Size: Normal)   Pulse 75   Ht 5\' 5"  (1.651 m)   Wt 147 lb 1.6 oz (66.7 kg)   BMI 24.48 kg/m  , BMI Body mass index is 24.48 kg/m. GENERAL:  Well appearing HEENT: Pupils equal round and reactive, fundi not visualized, oral mucosa unremarkable NECK:  No jugular venous distention, waveform within normal limits, carotid upstroke brisk and symmetric, no bruits, no thyromegaly LUNGS:  Clear to auscultation bilaterally HEART:  RRR.  PMI not displaced or sustained, S1 and S2 within normal limits, no S3, no S4, no clicks, no rubs, no murmurs ABD:  Flat, positive bowel sounds normal in frequency in pitch, no bruits, no rebound, no guarding, no midline pulsatile mass, no hepatomegaly, no splenomegaly EXT:  2 plus pulses throughout, no edema, no cyanosis, no clubbing SKIN:  No rashes, no nodules NEURO:  Cranial nerves II through XII grossly intact, motor grossly intact throughout PSYCH:  Cognitively intact, oriented to person place and time   ASSESSMENT/PLAN:    # Resistant Hypertension Longstanding hypertension, currently on Olmesartan and hydrochlorothiazide. Nifedipine was prescribed but not picked up. Patient has a history of stroke and family history of hypertension. Lifestyle factors include sedentary behavior and high salt intake. -Resume Nifedipine 60mg  daily. -Continue Olmesartan and hydrochlorothiazide. -Check  renin and aldosterone levels. -Order renal dopplers to rule out renal artery stenosis. -Encourage regular exercise and low salt diet. -Track blood pressure readings at home and follow up in a month. - Patient has agreed to participate in a study involving remote monitoring of blood pressure. -Enroll patient in study and provide necessary equipment and instructions.  # History of Stroke Patient had a mild stroke in 2020, precipitated by heavy physical exertion. No current symptoms of chest pain or shortness of  breath. -Continue current blood pressure management to reduce risk of future stroke.  # Hyperlipidemia Patient is on Repatha for cholesterol management.  Not taking as prescribed.  Discussed the importance of taking it every two weeks.  LDL goal <70 -Continue Repatha as prescribed.  # Potential Sleep Apnea Patient reports snoring but no excessive daytime sleepiness. -Monitor for further symptoms suggestive of sleep apnea.     Screening for Secondary Hypertension:     10/12/2022    2:27 PM  Causes  Drugs/Herbals Screened     - Comments tries to limit salt, minimal caffeine, occasional EtOH.  Renovascular HTN Screened     - Comments Check renal artery Dopplers.  Sleep Apnea Screened     - Comments snores.  Daytime somnolence.  Thyroid Disease Screened  Hyperaldosteronism Screened     - Comments check renin and aldosterone  Pheochromocytoma N/A  Cushing's Syndrome N/A  Hyperparathyroidism N/A  Coarctation of the Aorta Screened     - Comments BP symmetric  Compliance Screened     - Comments Meds not filled recnelty    Relevant Labs/Studies:    Latest Ref Rng & Units 08/27/2022    2:30 PM 05/07/2022   11:55 AM 04/07/2022    5:01 PM  Basic Labs  Sodium 134 - 144 mmol/L 142  143  143   Potassium 3.5 - 5.2 mmol/L 4.0  3.4  3.2   Creatinine 0.76 - 1.27 mg/dL 5.18  8.41  6.60        Latest Ref Rng & Units 04/07/2022    5:01 PM 07/14/2018    3:07 PM  Thyroid   TSH 0.450 -  4.500 uIU/mL 1.310  0.698                 10/12/2022    2:51 PM  Renovascular   Renal Artery [place] Completed Yes     Disposition:    FU with APP/PharmD in 1 month for the next 3 months.   FU with Dollie Bressi C. Duke Salvia, MD, [facility] in 4 months.  Medication Adjustments/Labs and Tests Ordered: Current medicines are reviewed at length with the patient today.  Concerns regarding medicines are outlined above.   Orders Placed This Encounter  Procedures   Aldosterone + renin activity w/ ratio   EKG 12-Lead   VAS US RENAL ARTERY DUPLEX   No orders of the defined types were placed in this encounter.  I,Mathew Stumpf,acting as a Neurosurgeon for Chilton Si, MD.,have documented all relevant documentation on the behalf of Chilton Si, MD,as directed by  Chilton Si, MD while in the presence of Chilton Si, MD.  Signed, Chilton Si, MD  10/12/2022 5:45 PM    [facility]

## 2022-10-12 NOTE — Research (Signed)
  Subject Name: Darrell [name] met inclusion and exclusion criteria for the Virtual Care and Social Determinant Interventions for the management of hypertension trial.  The informed consent form, study requirements and expectations were reviewed with the subject by Dr. Duke Salvia and myself. The subject was given the opportunity to read the consent and ask questions. The subject verbalized understanding of the trial requirements.  All questions were addressed prior to the signing of the consent form. The subject agreed to participate in the trial and signed the informed consent. The informed consent was obtained prior to performance of any protocol-specific procedures for the subject.  A copy of the signed informed consent was given to the subject and a copy was placed in the subject's medical record.  Darrell [name] was randomized to Group 2.

## 2022-10-13 ENCOUNTER — Telehealth: Payer: Self-pay

## 2022-10-13 DIAGNOSIS — Z0000 Encounter for general adult medical examination without abnormal findings: Secondary | ICD-10-CM

## 2022-10-13 NOTE — Telephone Encounter (Signed)
Called patient to conduct [employer] welcome call and offer health coaching per patient's survey responses regarding physical activity and healthy eating. Patient did not answer. Left message for patient to return call.   Renaee Munda, MS, ERHD, [facility]  Care Guide, Health & Wellness Coach [address], [address] [place] [address] Telephone: [phone] Email: [email or web address]

## 2022-10-15 ENCOUNTER — Other Ambulatory Visit: Payer: Self-pay

## 2022-10-15 ENCOUNTER — Encounter: Payer: Self-pay | Admitting: Pharmacist

## 2022-10-15 ENCOUNTER — Other Ambulatory Visit (HOSPITAL_COMMUNITY): Payer: Self-pay

## 2022-10-15 MED ORDER — TRULICITY 0.75 MG/0.5ML ~~LOC~~ SOAJ
0.7500 mg | SUBCUTANEOUS | 3 refills | Status: DC
  Filled 2022-10-15: qty 2, 28d supply, fill #0

## 2022-10-15 MED ORDER — RYBELSUS 3 MG PO TABS: 3.0000 mg | ORAL_TABLET | Freq: Every day | ORAL | Status: DC

## 2022-10-15 NOTE — Patient Instructions (Addendum)
Darrell [name],   It was great talking with you!  We will pursue assistance for the Rybelsus from [employer], to replace Trulicity.   Please call [facility] to discuss shipping for your other medicaitons.   Check your blood sugars twice daily:  1) Fasting, first thing in the morning before breakfast and  2) 2 hours after your largest meal.   For a goal A1c of less than 7%, goal fasting readings are less than 130 and goal 2 hour after meal readings are less than 180.    Take care!  Catie

## 2022-10-18 LAB — ALDOSTERONE + RENIN ACTIVITY W/ RATIO
Aldos/Renin Ratio: 62 — ABNORMAL HIGH (ref 0.0–30.0)
Aldosterone: 15.5 ng/dL (ref 0.0–30.0)
Renin Activity, Plasma: 0.25 ng/mL/h (ref 0.167–5.380)

## 2022-10-20 ENCOUNTER — Other Ambulatory Visit: Payer: Self-pay

## 2022-10-22 ENCOUNTER — Ambulatory Visit: Payer: Self-pay | Admitting: Nurse Practitioner

## 2022-10-25 ENCOUNTER — Ambulatory Visit (INDEPENDENT_AMBULATORY_CARE_PROVIDER_SITE_OTHER): Payer: 59 | Admitting: Nurse Practitioner

## 2022-10-25 ENCOUNTER — Encounter: Payer: Self-pay | Admitting: Nurse Practitioner

## 2022-10-25 ENCOUNTER — Other Ambulatory Visit (HOSPITAL_COMMUNITY): Payer: Self-pay

## 2022-10-25 VITALS — BP 176/101 | HR 68 | Temp 97.2°F | Wt 147.6 lb

## 2022-10-25 DIAGNOSIS — Z23 Encounter for immunization: Secondary | ICD-10-CM | POA: Diagnosis not present

## 2022-10-25 DIAGNOSIS — E785 Hyperlipidemia, unspecified: Secondary | ICD-10-CM

## 2022-10-25 DIAGNOSIS — E1169 Type 2 diabetes mellitus with other specified complication: Secondary | ICD-10-CM

## 2022-10-25 DIAGNOSIS — I10 Essential (primary) hypertension: Secondary | ICD-10-CM

## 2022-10-25 MED ORDER — CLONIDINE HCL 0.1 MG PO TABS
0.2000 mg | ORAL_TABLET | Freq: Once | ORAL | Status: AC
Start: 2022-10-25 — End: 2022-10-25
  Administered 2022-10-25: 0.2 mg via ORAL

## 2022-10-25 NOTE — Patient Instructions (Addendum)
   Please let us know if you have any trouble getting your medications Your medications are ready for you to pick up at the [facility] long outpatient pharmacy as discussed   Around 3 times per week, check your blood pressure 2 times per day. once in the morning and once in the evening. The readings should be at least one minute apart. Write down these values and bring them to your next nurse visit/appointment.  When you check your BP, make sure you have been doing something calm/relaxing 5 minutes prior to checking. Both feet should be flat on the floor and you should be sitting. Use your left arm and make sure it is in a relaxed position (on a table), and that the cuff is at the approximate level/height of your heart.      It is important that you exercise regularly at least 30 minutes 5 times a week as tolerated  Think about what you will eat, plan ahead. Choose " clean, green, fresh or frozen" over canned, processed or packaged foods which are more sugary, salty and fatty. 70 to 75% of food eaten should be vegetables and fruit. Three meals at set times with snacks allowed between meals, but they must be fruit or vegetables. Aim to eat over a 12 hour period , example 7 am to 7 pm, and STOP after  your last meal of the day. Drink water,generally about 64 ounces per day, no other drink is as healthy. Fruit juice is best enjoyed in a healthy way, by EATING the fruit.  Thanks for choosing [facility] we consider it a privelige to serve you.

## 2022-10-25 NOTE — Assessment & Plan Note (Signed)
Patient educated on CDC recommendation for the vaccine. Verbal consent was obtained from the patient, vaccine administered by nurse, no sign of adverse reactions noted at this time. Patient education on arm soreness and use of tylenol o for this patient  was discussed. Patient educated on the signs and symptoms of adverse effect and advise to contact the office if they occur. Vaccine information sheet given to patient.

## 2022-10-25 NOTE — Assessment & Plan Note (Addendum)
BP Readings from Last 3 Encounters:  10/25/22 (!) 176/101  10/12/22 (!) 184/94  09/24/22 (!) 141/85  Patient encouraged to go to the pharmacy today and pick up the prescription for nifedipine 60 mg daily, olmesartan-hydrochlorothiazide 40-25 mg 1 tablet daily Appreciate collaboration with the clinical pharmacist and cardiology Clonidine 0.2 mg given in the office today DASH diet advised Patient left the  clinic in a stable condition Pharmacy called by the nurse, with so that the patients medication was ready for pickup. I encouraged the patient to contact this office if he has any trouble getting his medication Follow-up in the office in 2 weeks

## 2022-10-25 NOTE — Assessment & Plan Note (Deleted)
Pharmacy is working on getting a PA for [employer] with pharmacist

## 2022-10-25 NOTE — Assessment & Plan Note (Addendum)
Continue metformin 1000 mg twice daily The clinical pharmacist is working on getting a medication assistance for Rybelsus an d a PA for repatha

## 2022-10-25 NOTE — Progress Notes (Signed)
Established Patient Office Visit  Subjective:  Patient ID: Darrell [name], male    DOB: August 07, 1955  Age: 67 y.o. MRN: [id number]  CC:  Chief Complaint  Patient presents with   Hypertension    HPI [name] is a 67 y.o. male  has a past medical history of Acute blood loss anemia (04/14/2021), Benign neoplasm of sigmoid colon, Benign prostatic hyperplasia without lower urinary tract symptoms (06/08/2017), Diverticulosis of colon with hemorrhage, Dyslipidemia, Esophageal stricture, Essential hypertension, benign, Expressive aphasia (07/14/2018), Gastroesophageal reflux disease with esophagitis without hemorrhage, History of TIA (transient ischemic attack) (04/14/2021), Hyperlipidemia, Hypertension (01/30/2015), Hypertensive emergency (07/14/2018), Prolonged QT interval (07/14/2018), RBBB, Statin intolerance (08/27/2022), 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  Patient presents for follow-up for his chronic medical conditions.  He is still not taking any medication Patient stated that he has been waiting for pharmacy to mail his medications however pharmacy had sent a message to the patient to provide an updated payment method in order for them to mail his prescriptions.  Blood pressure remains significantly elevated, patient denies chest pain, dizziness, headaches.  He has established care with cardiology for his uncontrolled HTN.  The clinical pharmacist working on getting medication assistance for Repatha and Rybelsus.  He has also enrolled in a research for virtual care and social determinants intervention for management of hypertension.       Past Medical History:  Diagnosis Date   Acute blood loss anemia 04/14/2021   Benign neoplasm of sigmoid colon    Benign prostatic hyperplasia without lower  urinary tract symptoms 06/08/2017   Diverticulosis of colon with hemorrhage    Dyslipidemia    Esophageal stricture    Essential hypertension, benign    Expressive aphasia 07/14/2018   Gastroesophageal reflux disease with esophagitis without hemorrhage    History of TIA (transient ischemic attack) 04/14/2021   Hyperlipidemia    Hypertension 01/30/2015   Hypertensive emergency 07/14/2018   Prolonged QT interval 07/14/2018   RBBB    Statin intolerance 08/27/2022   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, 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] 09/24/2022   Thyroid cyst    TIA (transient ischemic attack) 07/14/2018   Type 2 diabetes mellitus (HCC)    Type 2 diabetes mellitus with hyperlipidemia (HCC) 01/30/2015   Vitamin D deficiency     Past Surgical History:  Procedure Laterality Date   COLONOSCOPY WITH PROPOFOL N/A 04/15/2021   Procedure: COLONOSCOPY WITH PROPOFOL;  Surgeon: Hilarie Fredrickson, MD;  Location: [facility];  Service: Gastroenterology;  Laterality: N/A;   ESOPHAGOGASTRODUODENOSCOPY (EGD) WITH PROPOFOL N/A 04/15/2021   Procedure: ESOPHAGOGASTRODUODENOSCOPY (EGD) WITH PROPOFOL;  Surgeon: Hilarie Fredrickson, MD;  Location: [facility];  Service: Gastroenterology;  Laterality: N/A;   MELANOMA EXCISION     Back   POLYPECTOMY  04/15/2021   Procedure: POLYPECTOMY;  Surgeon: Hilarie Fredrickson, MD;  Location: [facility];  Service: Gastroenterology;;   WISDOM TOOTH EXTRACTION      Family History  Problem Relation Age of Onset   Hypertension Mother    Pulmonary embolism Mother    Diabetes Father    Hypertension Father    Alzheimer's disease Father    Heart disease Brother    Alzheimer's disease Brother    Diabetes Maternal Grandmother    Colon cancer Neg Hx     Social History   Socioeconomic History   Marital status: Single  Spouse name: Not on file   Number of children: 4   Years of education: Not on file   Highest education level: Not on file  Occupational History    Employer: [employer]  Tobacco Use   Smoking  status: Never   Smokeless tobacco: Never  Vaping Use   Vaping status: Never Used  Substance and Sexual Activity   Alcohol use: Yes    Comment: occasional   Drug use: No   Sexual activity: Not on file  Other Topics Concern   Not on file  Social History Narrative   Lives home alone   Social Determinants of Health   Financial Resource Strain: Not on file  Food Insecurity: No Food Insecurity (10/12/2022)   Hunger Vital Sign    Worried About Running Out of Food in the Last Year: Never true    Ran Out of Food in the Last Year: Never true  Transportation Needs: No Transportation Needs (10/12/2022)   PRAPARE - Administrator, Civil Service (Medical): No    Lack of Transportation (Non-Medical): No  Physical Activity: Inactive (10/12/2022)   Exercise Vital Sign    Days of Exercise per Week: 0 days    Minutes of Exercise per Session: 0 min  Stress: Not on file  Social Connections: Not on file  Intimate Partner Violence: Not on file    Outpatient Medications Prior to Visit  Medication Sig Dispense Refill   clopidogrel (PLAVIX) 75 MG tablet Take 1 tablet (75 mg total) by mouth daily. 90 tablet 1   metFORMIN (GLUCOPHAGE) 1000 MG tablet Take 1 tablet (1,000 mg total) by mouth 2 (two) times daily with a meal. 180 tablet 1   Multiple Vitamin (MULTIVITAMIN) tablet Take 1 tablet by mouth daily.     Continuous Blood Gluc Sensor (DEXCOM G7 SENSOR) MISC Use to check glucose continuously (Patient not taking: Reported on 10/25/2022) 3 each 11   Evolocumab (REPATHA SURECLICK) 140 MG/ML SOAJ Inject 140 mg into the skin every 14 (fourteen) days. (Patient not taking: Reported on 10/25/2022) 2 mL 2   glucose blood (TRUE METRIX BLOOD GLUCOSE TEST) test strip Use as instructed to check blood sugar twice daily.  (Patient not taking: Reported on 10/25/2022) 100 each 12   NIFEdipine (PROCARDIA XL/NIFEDICAL XL) 60 MG 24 hr tablet Take 1 tablet (60 mg total) by mouth daily. (Patient not taking: Reported  on 10/25/2022) 90 tablet 1   olmesartan-hydrochlorothiazide (BENICAR HCT) 40-25 MG tablet Take 1 tablet by mouth daily. (Patient not taking: Reported on 10/25/2022) 90 tablet 1   Semaglutide (RYBELSUS) 3 MG TABS Take 1 tablet (3 mg total) by mouth daily. (Patient not taking: Reported on 10/25/2022)     TRUEplus Lancets 28G MISC Use 2 (two) times daily at 8 am and 10 pm. (Patient not taking: Reported on 10/25/2022) 100 each 11   No facility-administered medications prior to visit.    Allergies  Allergen Reactions   Statins     ROS Review of Systems  Constitutional:  Negative for activity change, appetite change, chills, diaphoresis, fatigue, fever and unexpected weight change.  HENT:  Negative for congestion, dental problem, drooling and ear discharge.   Eyes:  Negative for pain, discharge, redness and itching.  Respiratory:  Negative for apnea, cough, choking, chest tightness, shortness of breath and wheezing.   Cardiovascular: Negative.  Negative for chest pain, palpitations and leg swelling.  Gastrointestinal:  Negative for abdominal distention, abdominal pain, anal bleeding, blood in stool, constipation, diarrhea and vomiting.  Endocrine: Negative for polydipsia, polyphagia and polyuria.  Genitourinary:  Negative for difficulty urinating, flank pain, frequency and genital sores.  Musculoskeletal: Negative.  Negative for arthralgias, back pain, gait problem and joint swelling.  Skin:  Negative for color change, pallor and rash.  Neurological:  Negative for dizziness, facial asymmetry, light-headedness, numbness and headaches.  Psychiatric/Behavioral:  Negative for agitation, behavioral problems, confusion, hallucinations, self-injury, sleep disturbance and suicidal ideas.       Objective:    Physical Exam Vitals and nursing note reviewed.  Constitutional:      General: He is not in acute distress.    Appearance: Normal appearance. He is obese. He is not ill-appearing,  toxic-appearing or diaphoretic.  HENT:     Mouth/Throat:     Mouth: Mucous membranes are moist.     Pharynx: Oropharynx is clear. No oropharyngeal exudate or posterior oropharyngeal erythema.  Eyes:     General: No scleral icterus.       Right eye: No discharge.        Left eye: No discharge.     Extraocular Movements: Extraocular movements intact.     Conjunctiva/sclera: Conjunctivae normal.  Cardiovascular:     Rate and Rhythm: Normal rate and regular rhythm.     Pulses: Normal pulses.     Heart sounds: Normal heart sounds. No murmur heard.    No friction rub. No gallop.  Pulmonary:     Effort: Pulmonary effort is normal. No respiratory distress.     Breath sounds: Normal breath sounds. No stridor. No wheezing, rhonchi or rales.  Chest:     Chest wall: No tenderness.  Abdominal:     General: There is no distension.     Palpations: Abdomen is soft.     Tenderness: There is no abdominal tenderness. There is no right CVA tenderness, left CVA tenderness or guarding.  Musculoskeletal:        General: No swelling, tenderness, deformity or signs of injury.     Right lower leg: No edema.     Left lower leg: No edema.  Skin:    General: Skin is warm and dry.     Capillary Refill: Capillary refill takes less than 2 seconds.     Coloration: Skin is not jaundiced or pale.     Findings: No bruising, erythema or lesion.  Neurological:     Mental Status: He is alert and oriented to person, place, and time.     Motor: No weakness.     Coordination: Coordination normal.     Gait: Gait normal.  Psychiatric:        Mood and Affect: Mood normal.        Behavior: Behavior normal.        Thought Content: Thought content normal.        Judgment: Judgment normal.     BP (!) 176/101   Pulse 68   Temp (!) 97.2 F (36.2 C)   Wt 147 lb 9.6 oz (67 kg)   SpO2 97%   BMI 24.56 kg/m  Wt Readings from Last 3 Encounters:  10/25/22 147 lb 9.6 oz (67 kg)  10/12/22 147 lb 1.6 oz (66.7 kg)   09/24/22 143 lb 9.6 oz (65.1 kg)    Lab Results  Component Value Date   TSH 1.310 04/07/2022   Lab Results  Component Value Date   WBC 8.0 04/07/2022   HGB 15.0 04/07/2022   HCT 42.8 04/07/2022   MCV 93 04/07/2022   PLT 200  04/07/2022   Lab Results  Component Value Date   NA 142 08/27/2022   K 4.0 08/27/2022   CO2 26 08/27/2022   GLUCOSE 189 (H) 08/27/2022   BUN 12 08/27/2022   CREATININE 0.83 08/27/2022   BILITOT 0.6 08/27/2022   ALKPHOS 56 08/27/2022   AST 12 08/27/2022   ALT 10 08/27/2022   PROT 6.7 08/27/2022   ALBUMIN 4.5 08/27/2022   CALCIUM 9.7 08/27/2022   ANIONGAP 9 04/15/2021   EGFR 98 08/27/2022   Lab Results  Component Value Date   CHOL 298 (H) 04/07/2022   Lab Results  Component Value Date   HDL 43 04/07/2022   Lab Results  Component Value Date   LDLCALC 194 (H) 04/07/2022   Lab Results  Component Value Date   TRIG 308 (H) 04/07/2022   Lab Results  Component Value Date   CHOLHDL 6.9 (H) 04/07/2022   Lab Results  Component Value Date   HGBA1C 7.9 (A) 08/27/2022   HGBA1C 7.9 08/27/2022      Assessment & Plan:   Problem List Items Addressed This Visit       Cardiovascular and Mediastinum   Hypertension - Primary    BP Readings from Last 3 Encounters:  10/25/22 (!) 176/101  10/12/22 (!) 184/94  09/24/22 (!) 141/85  Patient encouraged to go to the pharmacy today and pick up the prescription for nifedipine 60 mg daily, olmesartan-hydrochlorothiazide 40-25 mg 1 tablet daily Appreciate collaboration with the clinical pharmacist and cardiology Clonidine 0.2 mg given in the office today DASH diet advised Patient left the  clinic in a stable condition Pharmacy called by the nurse, with so that the patients medication was ready for pickup. I encouraged the patient to contact this office if he has any trouble getting his medication Follow-up in the office in 2 weeks        Endocrine   Type 2 diabetes mellitus with hyperlipidemia  (HCC)    Encouraged to go to the pharmacy and pick up prescription for metformin 1000 mg twice daily The clinical pharmacist is working on getting a medication assistance for Rybelsus an d a PA for repatha         Other   Need for influenza vaccination    Patient educated on CDC recommendation for the vaccine. Verbal consent was obtained from the patient, vaccine administered by nurse, no sign of adverse reactions noted at this time. Patient education on arm soreness and use of tylenol o for this patient  was discussed. Patient educated on the signs and symptoms of adverse effect and advise to contact the office if they occur. Vaccine information sheet given to patient.        Relevant Orders   Flu vaccine trivalent PF, 6mos and older(Flulaval,Afluria,Fluarix,Fluzone) (Completed)    Meds ordered this encounter  Medications   cloNIDine (CATAPRES) tablet 0.2 mg    Follow-up: Return in about 2 weeks (around 11/08/2022) for HTN.    Donell Beers, FNP

## 2022-10-26 ENCOUNTER — Telehealth: Payer: Self-pay

## 2022-10-26 ENCOUNTER — Telehealth (HOSPITAL_BASED_OUTPATIENT_CLINIC_OR_DEPARTMENT_OTHER): Payer: Self-pay

## 2022-10-26 DIAGNOSIS — Z0000 Encounter for general adult medical examination without abnormal findings: Secondary | ICD-10-CM

## 2022-10-26 NOTE — Telephone Encounter (Signed)
Called patient back to determine if he was successful in accessing the [employer] app. Patient stated that when trying to access the app this time, the screen blanked out white. Informed patient that he could try uninstalling and reinstalling the app. Offered the patient to submit a ticket to tech support for troubleshooting assistance. Patient agreed that would be okay. Patient's ticket number is [phone]. Patient expressed interest in increasing his physical activity as well. Patient has been scheduled for his initial health coaching session on 9/27 at 4:00pm. Patient will be called at this time and will also confirm that his [employer] concerns have been resolved.    Renaee Munda, MS, ERHD, [facility]  Care Guide, Health & Wellness Coach [address], [address] [place] [address] Telephone: [phone] Email: [email or web address]

## 2022-10-26 NOTE — Telephone Encounter (Signed)
Called patient to determine reason for noncompliance. Patient stated that he has not been able to access the app and that it does not recognize the pin number that he is entering. Provided patient with the pin number from [employer] [phone]) to try accessing the app. Patient requested a call back in 15 minutes to try logging in.   Renaee Munda, MS, ERHD, [facility]  Care Guide, Health & Wellness Coach [address], [address] [place] [address] Telephone: [phone] Email: [email or web address]

## 2022-10-26 NOTE — Telephone Encounter (Addendum)
Contacted patient, line answered and disconnected.   ----- Message from Chilton Si sent at 10/23/2022  8:13 AM EDT ----- Lab are indeterminate for hyperaldosteronism as the cause for difficult to control BP.  We need to do confirmatory testing.  We need to salt load with sodium chloride tablet 2g tid x3 days. On the third day of the high-sodium diet, need to get a BMP a 24-hour urine specimen for aldosterone, sodium, and creatinine.

## 2022-10-27 NOTE — Telephone Encounter (Signed)
Left message for patient to call back    ----- Message from [facility] sent at 10/23/2022  8:13 AM EDT ----- Lab are indeterminate for hyperaldosteronism as the cause for difficult to control BP.  We need to do confirmatory testing.  We need to salt load with sodium chloride tablet 2g tid x3 days. On the third day of the high-sodium diet, need to get a BMP a 24-hour urine specimen for aldosterone, sodium, and creatinine.

## 2022-10-28 ENCOUNTER — Institutional Professional Consult (permissible substitution) (HOSPITAL_BASED_OUTPATIENT_CLINIC_OR_DEPARTMENT_OTHER): Payer: 59 | Admitting: Family

## 2022-10-29 ENCOUNTER — Telehealth: Payer: Self-pay

## 2022-10-29 ENCOUNTER — Encounter (HOSPITAL_BASED_OUTPATIENT_CLINIC_OR_DEPARTMENT_OTHER): Payer: Self-pay

## 2022-10-29 ENCOUNTER — Ambulatory Visit: Payer: 59

## 2022-10-29 DIAGNOSIS — Z0000 Encounter for general adult medical examination without abnormal findings: Secondary | ICD-10-CM

## 2022-10-29 NOTE — Telephone Encounter (Signed)
Called patient as scheduled. Patient answered. I introduced myself and reason for call. Patient stated this was a good time to talk. As I proceed with the reason for the call the patient hung up.   Renaee Munda, MS, ERHD, [facility]  Care Guide, Health & Wellness Coach [address] [place] [address] Telephone: [phone] Email: [email or web address]

## 2022-10-29 NOTE — Telephone Encounter (Signed)
3rd call attempt, no answer, left message to call back, results mailed to patient with instructions to call the office to discuss.

## 2022-11-04 ENCOUNTER — Encounter (HOSPITAL_BASED_OUTPATIENT_CLINIC_OR_DEPARTMENT_OTHER): Payer: 59

## 2022-11-08 ENCOUNTER — Ambulatory Visit: Payer: Self-pay | Admitting: Nurse Practitioner

## 2022-11-15 ENCOUNTER — Other Ambulatory Visit (HOSPITAL_COMMUNITY): Payer: Self-pay

## 2022-11-15 ENCOUNTER — Ambulatory Visit: Payer: Medicare PPO | Admitting: Neurology

## 2022-11-15 ENCOUNTER — Encounter: Payer: Self-pay | Admitting: Neurology

## 2022-11-15 VITALS — BP 144/77 | HR 59 | Ht 65.0 in | Wt 150.0 lb

## 2022-11-15 DIAGNOSIS — Z8673 Personal history of transient ischemic attack (TIA), and cerebral infarction without residual deficits: Secondary | ICD-10-CM | POA: Diagnosis not present

## 2022-11-15 DIAGNOSIS — I6381 Other cerebral infarction due to occlusion or stenosis of small artery: Secondary | ICD-10-CM | POA: Diagnosis not present

## 2022-11-15 MED ORDER — CLOPIDOGREL BISULFATE 75 MG PO TABS
75.0000 mg | ORAL_TABLET | Freq: Every day | ORAL | 3 refills | Status: DC
  Filled 2022-11-15 – 2023-02-16 (×2): qty 90, 90d supply, fill #0
  Filled 2023-05-10: qty 90, 90d supply, fill #1
  Filled 2023-08-08: qty 90, 90d supply, fill #2
  Filled 2023-11-06: qty 90, 90d supply, fill #3

## 2022-11-15 NOTE — Patient Instructions (Signed)
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

## 2022-11-15 NOTE — Progress Notes (Signed)
[facility]  PATIENT: Darrell [name] DOB: April 08, 1955  REQUESTING CLINICIAN: Donell Beers, FNP HISTORY FROM: Patient/Chart review  REASON FOR VISIT: History of stroke/TIA. Need for Plavix?    HISTORICAL  CHIEF COMPLAINT:  Chief Complaint  Patient presents with   New Patient (Initial Visit)    Rm 12. Alone. Hx of TIA, discuss Plavix.    HISTORY OF PRESENT ILLNESS:  67 year old gentleman past medical history hypertension, hyperlipidemia, diabetes, history of stroke in 2020 and TIA who is presenting for management of his medications.  Patient reports in 2020 he did have a stroke, at that time he was on aspirin.  At discharge he was put on Plavix.  He reports after that he did have a colonic polyp that ruptured and he was bleeding from that.  He did stop the Plavix for a certain amount of time, not sure how long, and currently he has restarted the Plavix in the past couple months.  Denies any new bleeding.  Denies any new stroke symptoms.  No other complaints.   OTHER MEDICAL CONDITIONS: Hypertension, Hyperlipidemia, Diabetes, History of stroke in 2020   REVIEW OF SYSTEMS: Full 14 system review of systems performed and negative with exception of: As noted in the HPI   ALLERGIES: Allergies  Allergen Reactions   Statins     HOME MEDICATIONS: Outpatient Medications Prior to Visit  Medication Sig Dispense Refill   metFORMIN (GLUCOPHAGE) 1000 MG tablet Take 1 tablet (1,000 mg total) by mouth 2 (two) times daily with a meal. 180 tablet 1   Multiple Vitamin (MULTIVITAMIN) tablet Take 1 tablet by mouth daily.     NIFEdipine (PROCARDIA XL/NIFEDICAL XL) 60 MG 24 hr tablet Take 1 tablet (60 mg total) by mouth daily. 90 tablet 1   clopidogrel (PLAVIX) 75 MG tablet Take 1 tablet (75 mg total) by mouth daily. 90 tablet 1   olmesartan-hydrochlorothiazide (BENICAR HCT) 40-25 MG tablet Take 1 tablet by mouth daily. 90 tablet 1   Semaglutide (RYBELSUS) 3 MG TABS Take 1  tablet (3 mg total) by mouth daily. (Patient not taking: Reported on 10/25/2022)     Continuous Blood Gluc Sensor (DEXCOM G7 SENSOR) MISC Use to check glucose continuously (Patient not taking: Reported on 10/25/2022) 3 each 11   Evolocumab (REPATHA SURECLICK) 140 MG/ML SOAJ Inject 140 mg into the skin every 14 (fourteen) days. (Patient not taking: Reported on 10/25/2022) 2 mL 2   glucose blood (TRUE METRIX BLOOD GLUCOSE TEST) test strip Use as instructed to check blood sugar twice daily.  (Patient not taking: Reported on 10/25/2022) 100 each 12   TRUEplus Lancets 28G MISC Use 2 (two) times daily at 8 am and 10 pm. (Patient not taking: Reported on 10/25/2022) 100 each 11   No facility-administered medications prior to visit.    PAST MEDICAL HISTORY: Past Medical History:  Diagnosis Date   Acute blood loss anemia 04/14/2021   Benign neoplasm of sigmoid colon    Benign prostatic hyperplasia without lower urinary tract symptoms 06/08/2017   Diverticulosis of colon with hemorrhage    Dyslipidemia    Esophageal stricture    Essential hypertension, benign    Expressive aphasia 07/14/2018   Gastroesophageal reflux disease with esophagitis without hemorrhage    History of TIA (transient ischemic attack) 04/14/2021   Hyperlipidemia    Hypertension 01/30/2015   Hypertensive emergency 07/14/2018   Prolonged QT interval 07/14/2018   RBBB    Statin intolerance 08/27/2022   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, T46.6X5A] 09/24/2022  Thyroid cyst    TIA (transient ischemic attack) 07/14/2018   Type 2 diabetes mellitus (HCC)    Type 2 diabetes mellitus with hyperlipidemia (HCC) 01/30/2015   Vitamin D deficiency     PAST SURGICAL HISTORY: Past Surgical History:  Procedure Laterality Date   COLONOSCOPY WITH PROPOFOL N/A 04/15/2021   Procedure: COLONOSCOPY WITH PROPOFOL;  Surgeon: Hilarie Fredrickson, MD;  Location: [facility];  Service: Gastroenterology;  Laterality: N/A;   ESOPHAGOGASTRODUODENOSCOPY (EGD) WITH  PROPOFOL N/A 04/15/2021   Procedure: ESOPHAGOGASTRODUODENOSCOPY (EGD) WITH PROPOFOL;  Surgeon: Hilarie Fredrickson, MD;  Location: [facility];  Service: Gastroenterology;  Laterality: N/A;   MELANOMA EXCISION     Back   POLYPECTOMY  04/15/2021   Procedure: POLYPECTOMY;  Surgeon: Hilarie Fredrickson, MD;  Location: [facility];  Service: Gastroenterology;;   WISDOM TOOTH EXTRACTION      FAMILY HISTORY: Family History  Problem Relation Age of Onset   Hypertension Mother    Pulmonary embolism Mother    Diabetes Father    Hypertension Father    Alzheimer's disease Father    Heart disease Brother    Alzheimer's disease Brother    Diabetes Maternal Grandmother    Colon cancer Neg Hx     SOCIAL HISTORY: Social History   Socioeconomic History   Marital status: Single    Spouse name: Not on file   Number of children: 4   Years of education: Not on file   Highest education level: Not on file  Occupational History    Employer: [employer]  Tobacco Use   Smoking status: Never   Smokeless tobacco: Never  Vaping Use   Vaping status: Never Used  Substance and Sexual Activity   Alcohol use: Yes    Comment: occasional   Drug use: No   Sexual activity: Not on file  Other Topics Concern   Not on file  Social History Narrative   Lives home alone   Social Determinants of Health   Financial Resource Strain: Not on file  Food Insecurity: No Food Insecurity (10/12/2022)   Hunger Vital Sign    Worried About Running Out of Food in the Last Year: Never true    Ran Out of Food in the Last Year: Never true  Transportation Needs: No Transportation Needs (10/12/2022)   PRAPARE - Administrator, Civil Service (Medical): No    Lack of Transportation (Non-Medical): No  Physical Activity: Inactive (10/12/2022)   Exercise Vital Sign    Days of Exercise per Week: 0 days    Minutes of Exercise per Session: 0 min  Stress: Not on file  Social Connections: Not on file  Intimate Partner Violence:  Not on file    PHYSICAL EXAM  GENERAL EXAM/CONSTITUTIONAL: Vitals:  Vitals:   11/15/22 0851  BP: (!) 144/77  Pulse: (!) 59  Weight: 150 lb (68 kg)  Height: 5\' 5"  (1.651 m)   Body mass index is 24.96 kg/m. Wt Readings from Last 3 Encounters:  11/15/22 150 lb (68 kg)  10/25/22 147 lb 9.6 oz (67 kg)  10/12/22 147 lb 1.6 oz (66.7 kg)   Patient is in no distress; well developed, nourished and groomed; neck is supple  MUSCULOSKELETAL: Gait, strength, tone, movements noted in Neurologic exam below  NEUROLOGIC: MENTAL STATUS:      No data to display         awake, alert, oriented to person, place and time recent and remote memory intact normal attention and concentration language fluent,  comprehension intact, naming intact fund of knowledge appropriate  CRANIAL NERVE:  2nd, 3rd, 4th, 6th - Visual fields full to confrontation, extraocular muscles intact, no nystagmus 5th - facial sensation symmetric 7th - facial strength symmetric 8th - hearing intact 9th - palate elevates symmetrically, uvula midline 11th - shoulder shrug symmetric 12th - tongue protrusion midline  MOTOR:  normal bulk and tone, full strength in the BUE, BLE  SENSORY:  normal and symmetric to light touch  COORDINATION:  finger-nose-finger, fine finger movements normal  GAIT/STATION:  normal     DIAGNOSTIC DATA (LABS, IMAGING, TESTING) - I reviewed patient records, labs, notes, testing and imaging myself where available.  Lab Results  Component Value Date   WBC 8.0 04/07/2022   HGB 15.0 04/07/2022   HCT 42.8 04/07/2022   MCV 93 04/07/2022   PLT 200 04/07/2022      Component Value Date/Time   NA 142 08/27/2022 1430   K 4.0 08/27/2022 1430   CL 104 08/27/2022 1430   CO2 26 08/27/2022 1430   GLUCOSE 189 (H) 08/27/2022 1430   GLUCOSE 99 04/15/2021 0311   BUN 12 08/27/2022 1430   CREATININE 0.83 08/27/2022 1430   CALCIUM 9.7 08/27/2022 1430   PROT 6.7 08/27/2022 1430   ALBUMIN  4.5 08/27/2022 1430   AST 12 08/27/2022 1430   ALT 10 08/27/2022 1430   ALKPHOS 56 08/27/2022 1430   BILITOT 0.6 08/27/2022 1430   GFRNONAA >60 04/15/2021 0311   GFRAA >60 07/15/2018 1115   Lab Results  Component Value Date   CHOL 298 (H) 04/07/2022   HDL 43 04/07/2022   LDLCALC 194 (H) 04/07/2022   TRIG 308 (H) 04/07/2022   CHOLHDL 6.9 (H) 04/07/2022   Lab Results  Component Value Date   HGBA1C 7.9 (A) 08/27/2022   HGBA1C 7.9 08/27/2022   No results found for: "VITAMINB12" Lab Results  Component Value Date   TSH 1.310 04/07/2022    CTA Head and Neck 2020 1. Minimal atherosclerotic changes in the cavernous internal carotid arteries bilaterally without a significant stenosis. Otherwise normal CT of the neck. 2. Minimal irregularity of the distal left M1 segment without a significant stenosis. 3. CTA circle-of-Willis otherwise within normal limits.   MRI Brain 2020 Possible punctate acute infarction in the right side of the pons base axial diffusion imaging and ADC map. This is not confirmed on the coronal study however. Mild chronic small-vessel changes elsewhere affecting the pons. Moderate to severe chronic small-vessel ischemic changes of the cerebral hemispheric white matter. Old infarction left basal ganglia and radiating white matter tracts    ASSESSMENT AND PLAN  67 y.o. year old male with vascular risk factor including hypertension, hyperlipidemia, diabetes who is presenting for management of his medications in terms of stroke prevention.  Patient was previously on aspirin but did have a stroke in 2020.  From the stroke, he was switched to Plavix.  He did have bleeding after having a colonic polyp ruptured but since then has not had any additional bleeding.  In terms of stroke prevention, I have advised patient to continue with Plavix but if he does have additional bleeding in the future we will need to reconsider.  His LDL is elevated, he is on Repatha.  I have advised  patient to continue medication, continue maintaining good diet, good health and good sleep.  Continue to follow with PCP and return as needed.   1. 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 History of TIA (transient ischemic attack)      Patient Instructions  Continue current medications  Continue with Plavix 75 mg, He did have a stroke in 2020, while on Aspirin  Continue to follow up with PCP  Return as needed   No orders of the defined types were placed in this encounter.   Meds ordered this encounter  Medications   clopidogrel (PLAVIX) 75 MG tablet    Sig: Take 1 tablet (75 mg total) by mouth daily.    Dispense:  90 tablet    Refill:  3    For mail order    Return if symptoms worsen or fail to improve.  I have spent a total of 45 minutes dedicated to this patient today, preparing to see patient, performing a medically appropriate examination and evaluation, ordering tests and/or medications and procedures, and counseling and educating the patient/family/caregiver; independently interpreting result and communicating results to the family/patient/caregiver; and documenting clinical information in the electronic medical record.   Windell Norfolk, MD 11/15/2022, 9:21 AM  [facility] [address], [address] [place], [place] [address] [phone]

## 2022-11-16 ENCOUNTER — Other Ambulatory Visit (HOSPITAL_COMMUNITY): Payer: Self-pay

## 2022-11-24 ENCOUNTER — Other Ambulatory Visit: Payer: Self-pay

## 2022-12-01 ENCOUNTER — Encounter (HOSPITAL_BASED_OUTPATIENT_CLINIC_OR_DEPARTMENT_OTHER): Payer: Medicare PPO

## 2022-12-16 ENCOUNTER — Ambulatory Visit: Payer: Medicare PPO

## 2022-12-16 ENCOUNTER — Ambulatory Visit (HOSPITAL_BASED_OUTPATIENT_CLINIC_OR_DEPARTMENT_OTHER): Payer: 59 | Admitting: Family

## 2022-12-16 NOTE — Progress Notes (Incomplete)
Patient ID: Darrell [name]                 DOB: [birth year]                      MRN: [id number]     HPI: Darrell [name] is a 67 y.o. male referred by Dr. Duke Salvia to HTN clinic. PMH is significant for  Current HTN meds:  Olmesartan-hydrochlorothiazide 40-25mg  daily Nifedipine 60mg  daily  Previously tried:  BP goal:   Family History:   Social History:   Diet:   Exercise:   Home BP readings:   Wt Readings from Last 3 Encounters:  11/15/22 150 lb (68 kg)  10/25/22 147 lb 9.6 oz (67 kg)  10/12/22 147 lb 1.6 oz (66.7 kg)   BP Readings from Last 3 Encounters:  11/15/22 (!) 144/77  10/25/22 (!) 176/101  10/12/22 (!) 184/94   Pulse Readings from Last 3 Encounters:  11/15/22 (!) 59  10/25/22 68  10/12/22 75    Renal function: CrCl cannot be calculated (Patient's most recent lab result is older than the maximum 21 days allowed.).  Past Medical History:  Diagnosis Date  . Acute blood loss anemia 04/14/2021  . Benign neoplasm of sigmoid colon   . Benign prostatic hyperplasia without lower urinary tract symptoms 06/08/2017  . Diverticulosis of colon with hemorrhage   . Dyslipidemia   . Esophageal stricture   . Essential hypertension, benign   . Expressive aphasia 07/14/2018  . Gastroesophageal reflux disease with esophagitis without hemorrhage   . History of TIA (transient ischemic attack) 04/14/2021  . Hyperlipidemia   . Hypertension 01/30/2015  . Hypertensive emergency 07/14/2018  . Prolonged QT interval 07/14/2018  . RBBB   . Statin intolerance 08/27/2022  . 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 Thyroid cyst   . TIA (transient ischemic attack) 07/14/2018  . Type 2 diabetes mellitus (HCC)   . Type 2 diabetes mellitus with hyperlipidemia (HCC) 01/30/2015  . Vitamin D deficiency     Current Outpatient Medications on File Prior to Visit  Medication Sig Dispense Refill  . clopidogrel (PLAVIX) 75 MG tablet Take 1 tablet (75 mg total) by mouth daily. 90  tablet 3  . metFORMIN (GLUCOPHAGE) 1000 MG tablet Take 1 tablet (1,000 mg total) by mouth 2 (two) times daily with a meal. 180 tablet 1  . Multiple Vitamin (MULTIVITAMIN) tablet Take 1 tablet by mouth daily.    Marland Kitchen NIFEdipine (PROCARDIA XL/NIFEDICAL XL) 60 MG 24 hr tablet Take 1 tablet (60 mg total) by mouth daily. 90 tablet 1  . olmesartan-hydrochlorothiazide (BENICAR HCT) 40-25 MG tablet Take 1 tablet by mouth daily. 90 tablet 1  . Semaglutide (RYBELSUS) 3 MG TABS Take 1 tablet (3 mg total) by mouth daily. (Patient not taking: Reported on 10/25/2022)     No current facility-administered medications on file prior to visit.    Allergies  Allergen Reactions  . Statins      Assessment/Plan:  1. Hypertension -

## 2022-12-17 ENCOUNTER — Ambulatory Visit: Payer: Medicare PPO | Attending: Internal Medicine | Admitting: Pharmacist Clinician (PhC)/ Clinical Pharmacy Specialist

## 2022-12-17 ENCOUNTER — Other Ambulatory Visit (HOSPITAL_COMMUNITY): Payer: Self-pay

## 2022-12-17 VITALS — BP 174/93 | HR 66

## 2022-12-17 DIAGNOSIS — I10 Essential (primary) hypertension: Secondary | ICD-10-CM

## 2022-12-17 MED ORDER — NIFEDIPINE ER OSMOTIC RELEASE 90 MG PO TB24
90.0000 mg | ORAL_TABLET | Freq: Every day | ORAL | 3 refills | Status: DC
  Filled 2022-12-17: qty 90, 90d supply, fill #0

## 2022-12-17 NOTE — Patient Instructions (Addendum)
Follow up appointment: January 16 at 1:30 pm  Take your BP meds as follows:  Continue with olmesartan hct 40/25 mg  Re-start nifedipine xl at 90 mg once daily   Continue with all other medications  Check your blood pressure at home daily (if able) and keep record of the readings.  Hypertension "High blood pressure"  Hypertension is often called "The Silent Killer." It rarely causes symptoms until it is extremely  high or has done damage to other organs in the body. For this reason, you should have your  blood pressure checked regularly by your physician. We will check your blood pressure  every time you see a provider at one of our offices.   Your blood pressure reading consists of two numbers. Ideally, blood pressure should be  below 120/80. The first ("top") number is called the systolic pressure. It measures the  pressure in your arteries as your heart beats. The second ("bottom") number is called the diastolic pressure. It measures the pressure in your arteries as the heart relaxes between beats.  The benefits of getting your blood pressure under control are enormous. A 10-point  reduction in systolic blood pressure can reduce your risk of stroke by 27% and heart failure by 28%  Your blood pressure goal is < 130/80  To check your pressure at home you will need to:  1. Sit up in a chair, with feet flat on the floor and back supported. Do not cross your ankles or legs. 2. Rest your left arm so that the cuff is about heart level. If the cuff goes on your upper arm,  then just relax the arm on the table, arm of the chair or your lap. If you have a wrist cuff, we  suggest relaxing your wrist against your chest (think of it as Pledging the Flag with the  wrong arm).  3. Place the cuff snugly around your arm, about 1 inch above the crook of your elbow. The  cords should be inside the groove of your elbow.  4. Sit quietly, with the cuff in place, for about 5 minutes. After that 5  minutes press the power  button to start a reading. 5. Do not talk or move while the reading is taking place.  6. Record your readings on a sheet of paper. Although most cuffs have a memory, it is often  easier to see a pattern developing when the numbers are all in front of you.  7. You can repeat the reading after 1-3 minutes if it is recommended  Make sure your bladder is empty and you have not had caffeine or tobacco within the last 30 min  Always bring your blood pressure log with you to your appointments. If you have not brought your monitor in to be double checked for accuracy, please bring it to your next appointment.  You can find a list of quality blood pressure cuffs at validatebp.org

## 2022-12-17 NOTE — Progress Notes (Unsigned)
Office Visit    Patient Name: Darrell [name] Date of Encounter: 01/31/2023  Primary Care Provider:  Donell Beers, FNP Primary Cardiologist:  Chilton Si MD  Chief Complaint    Hypertension - Advanced hypertension clinic  Past Medical History   TIA Old infarct found in 2020  HLD 3/24 LDL 194, now on Repatha  DM2 On metformin, Rybelsus  Vit D deficiency  3/24 at 26.3       Allergies  Allergen Reactions   Statins     History of Present Illness    Darrell [name] TABACCO is a 67 y.o. male patient who was referred to the Advanced Hypertension Clinic by Edwin Dada FNP after an office reading of 171/103.   Patient admitted had been out of medications for several weeks (nifedipine xl 60, olmesartan hctz 40/25).  He admitted to having issues with the costs of meds.  When he saw Dr. Duke Salvia in September, he noted being first diagnosed in his 40's, pressure was 168/104.  She did labs for secondary hypertension, which showed an elevated ARR, in light of norma renin and aldosterone.  She requested a salt load study, but patient never responded to the message.    Today he notes he is out of the nifedipine, and has not been taking his blood pressure regularly.  He is unsure when it was last checked.    Blood Pressure Goal:  130/80  Current Medications: olmesartan hctz 40/25, nifedipine xl 60   Family Hx:  mother had hypertension, PE; father hypertension, DM, dementia, brother with heart disease, dementia  Diet:    mostly home cooked foods, high in protein (beef, chicken and fish), protein shakes and water  Home BP readings:    no readings in [employer]   Accessory Clinical Findings    Lab Results  Component Value Date   CREATININE 0.86 12/28/2022   BUN 12 12/28/2022   NA 147 (H) 12/28/2022   K 3.6 12/28/2022   CL 102 12/28/2022   CO2 26 12/28/2022   Lab Results  Component Value Date   ALT 10 08/27/2022   AST 12 08/27/2022   ALKPHOS 56 08/27/2022   BILITOT 0.6  08/27/2022   Lab Results  Component Value Date   HGBA1C 7.7 (A) 12/28/2022    Screening for Secondary Hypertension: { Click here to document screening for secondary causes of HTN  :1}     10/12/2022    2:27 PM  Causes  Drugs/Herbals Screened     - Comments tries to limit salt, minimal caffeine, occasional EtOH.  Renovascular HTN Screened     - Comments Check renal artery Dopplers.  Sleep Apnea Screened     - Comments snores.  Daytime somnolence.  Thyroid Disease Screened  Hyperaldosteronism Screened     - Comments check renin and aldosterone  Pheochromocytoma N/A  Cushing's Syndrome N/A  Hyperparathyroidism N/A  Coarctation of the Aorta Screened     - Comments BP symmetric  Compliance Screened     - Comments Meds not filled recnelty    Relevant Labs/Studies:    Latest Ref Rng & Units 12/28/2022    3:29 PM 08/27/2022    2:30 PM 05/07/2022   11:55 AM  Basic Labs  Sodium 134 - 144 mmol/L 147  142  143   Potassium 3.5 - 5.2 mmol/L 3.6  4.0  3.4   Creatinine 0.76 - 1.27 mg/dL 1.61  0.96  0.45        Latest Ref Rng &  Units 04/07/2022    5:01 PM 07/14/2018    3:07 PM  Thyroid   TSH 0.450 - 4.500 uIU/mL 1.310  0.698        Latest Ref Rng & Units 10/12/2022    3:36 PM  Renin/Aldosterone   Aldosterone 0.0 - 30.0 ng/dL 47.4   Aldos/Renin Ratio 0.0 - 30.0 62.0              10/12/2022    2:51 PM  Renovascular   Renal Artery [place] Completed Yes      Home Medications    Current Outpatient Medications  Medication Sig Dispense Refill   clopidogrel (PLAVIX) 75 MG tablet Take 1 tablet (75 mg total) by mouth daily. 90 tablet 3   olmesartan-hydrochlorothiazide (BENICAR HCT) 40-25 MG tablet Take 1 tablet by mouth daily. 90 tablet 1   Evolocumab (REPATHA SURECLICK) 140 MG/ML SOAJ Inject 140 mg into the skin every 14 (fourteen) days. 6 mL 2   metFORMIN (GLUCOPHAGE) 1000 MG tablet Take 1 tablet (1,000 mg total) by mouth 2 (two) times daily with a meal. 180 tablet 1   Multiple  Vitamin (MULTIVITAMIN) tablet Take 1 tablet by mouth daily.     NIFEdipine (PROCARDIA XL/NIFEDICAL XL) 60 MG 24 hr tablet Take 1 tablet (60 mg total) by mouth in the morning and at bedtime. 180 tablet 1   Semaglutide (RYBELSUS) 3 MG TABS Take 1 tablet (3 mg total) by mouth daily. (Patient not taking: Reported on 01/20/2023) 30 tablet 0   Semaglutide (RYBELSUS) 7 MG TABS Take 1 tablet (7 mg total) by mouth daily. (Patient not taking: Reported on 01/20/2023) 30 tablet 0   No current facility-administered medications for this visit.     Assessment & Plan   174/93  Hypertension Assessment: BP is uncontrolled in office BP 174/93 mmHg;  above the goal (<130/80). Tolerates current medications well without any side effects Denies SOB, palpitation, chest pain, headaches,or swelling Reiterated the importance of regular exercise and low salt diet   Plan:  Increase nifedipine to 90 mg daily Continue taking olmesartan hctz 40/25 Patient to keep record of BP readings with heart rate and report to [place] at the next visit Patient to follow up with Dr. Duke Salvia in 2 months  Labs ordered today:  none   Phillips Hay PharmD CPP [facility]  [address] [place], [place] [address] [phone]

## 2022-12-27 ENCOUNTER — Ambulatory Visit: Payer: Self-pay | Admitting: Nurse Practitioner

## 2022-12-28 ENCOUNTER — Other Ambulatory Visit (HOSPITAL_COMMUNITY): Payer: Self-pay | Admitting: Nurse Practitioner

## 2022-12-28 ENCOUNTER — Other Ambulatory Visit: Payer: Self-pay

## 2022-12-28 ENCOUNTER — Other Ambulatory Visit (INDEPENDENT_AMBULATORY_CARE_PROVIDER_SITE_OTHER): Payer: Self-pay | Admitting: Pharmacist

## 2022-12-28 ENCOUNTER — Encounter: Payer: Self-pay | Admitting: Nurse Practitioner

## 2022-12-28 ENCOUNTER — Ambulatory Visit (INDEPENDENT_AMBULATORY_CARE_PROVIDER_SITE_OTHER): Payer: Self-pay | Admitting: Nurse Practitioner

## 2022-12-28 ENCOUNTER — Other Ambulatory Visit (HOSPITAL_COMMUNITY): Payer: Self-pay

## 2022-12-28 VITALS — BP 160/84 | HR 71 | Wt 146.6 lb

## 2022-12-28 DIAGNOSIS — I10 Essential (primary) hypertension: Secondary | ICD-10-CM

## 2022-12-28 DIAGNOSIS — E1169 Type 2 diabetes mellitus with other specified complication: Secondary | ICD-10-CM

## 2022-12-28 DIAGNOSIS — Z8673 Personal history of transient ischemic attack (TIA), and cerebral infarction without residual deficits: Secondary | ICD-10-CM

## 2022-12-28 DIAGNOSIS — D1722 Benign lipomatous neoplasm of skin and subcutaneous tissue of left arm: Secondary | ICD-10-CM | POA: Insufficient documentation

## 2022-12-28 DIAGNOSIS — E782 Mixed hyperlipidemia: Secondary | ICD-10-CM

## 2022-12-28 DIAGNOSIS — E785 Hyperlipidemia, unspecified: Secondary | ICD-10-CM | POA: Diagnosis not present

## 2022-12-28 LAB — POCT GLYCOSYLATED HEMOGLOBIN (HGB A1C): HbA1c, POC (controlled diabetic range): 7.7 % — AB (ref 0.0–7.0)

## 2022-12-28 MED ORDER — METFORMIN HCL 1000 MG PO TABS
1000.0000 mg | ORAL_TABLET | Freq: Two times a day (BID) | ORAL | 1 refills | Status: DC
  Filled 2022-12-28 – 2023-02-15 (×2): qty 180, 90d supply, fill #0
  Filled 2023-05-09: qty 180, 90d supply, fill #1

## 2022-12-28 MED ORDER — REPATHA SURECLICK 140 MG/ML ~~LOC~~ SOAJ
140.0000 mg | SUBCUTANEOUS | 2 refills | Status: DC
  Filled 2022-12-28 (×2): qty 2, 28d supply, fill #0
  Filled 2023-02-15 – 2023-03-03 (×4): qty 2, 28d supply, fill #1

## 2022-12-28 NOTE — Assessment & Plan Note (Signed)
LDL goal is less than 55 Restart Repatha injection Avoid fatty fried foods Appreciate collaboration with the clinical pharmacist

## 2022-12-28 NOTE — Patient Instructions (Signed)
Restart Repatha. We helped you enroll in the [employer] to pay for the copay for cholesterol medications. This will cover your Repatha copays for the next 12 months.   We applied for Rybelsus (diabetes medication) assistance. It can take a few weeks for this to process, but we will call you when the order arrives to clinic.    Please consider purchasing a good quality blood pressure machine, such as an Omron-brand upper arm cuff. This can be purchased from any pharmacy, including our [facility] outpatient pharmacies, for ~$30-40. We recommend a blood pressure cuff that goes around your upper arm, as these are generally the most accurate.    Check your blood pressure twice daily, and any time you have concerning symptoms like headache, chest pain, dizziness, shortness of breath, or vision changes.   Our goal is less than 130/80.  To appropriately check your blood pressure, make sure you do the following:  1) Avoid caffeine, exercise, or tobacco products for 30 minutes before checking. Empty your bladder. 2) Sit with your back supported in a flat-backed chair. Rest your arm on something flat (arm of the chair, table, etc). 3) Sit still with your feet flat on the floor, resting, for at least 5 minutes.  4) Check your blood pressure. Take 1-2 readings.  5) Write down these readings and bring with you to any provider appointments.  Bring your home blood pressure machine with you to a provider's office for accuracy comparison at least once a year.   Make sure you take your blood pressure medications before you come to any office visit, even if you were asked to fast for labs.    Catie Eppie Gibson, PharmD, BCACP, CPP Clinical Pharmacist [facility] [phone]

## 2022-12-28 NOTE — Assessment & Plan Note (Addendum)
Continue Plavix 75 mg daily Starting Repatha 140 mg every 2 weeks for hyperlipidemia

## 2022-12-28 NOTE — Progress Notes (Signed)
Established Patient Office Visit  Subjective:  Patient ID: Darrell [name], male    DOB: Jun 08, 1955  Age: 67 y.o. MRN: [id number]  CC:  Chief Complaint  Patient presents with   Medical Management of Chronic Issues    HPI Darrell [name] is a 67 y.o. male  has a past medical history of Acute blood loss anemia (04/14/2021), Benign neoplasm of sigmoid colon, Benign prostatic hyperplasia without lower urinary tract symptoms (06/08/2017), Diverticulosis of colon with hemorrhage, Dyslipidemia, Esophageal stricture, Essential hypertension, benign, Expressive aphasia (07/14/2018), Gastroesophageal reflux disease with esophagitis without hemorrhage, History of TIA (transient ischemic attack) (04/14/2021), Hyperlipidemia, Hypertension (01/30/2015), Hypertensive emergency (07/14/2018), Prolonged QT interval (07/14/2018), RBBB, Statin intolerance (08/27/2022), 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     Hypertension.  Currently on olmesartan-hydrochlorothiazide 40-25 mg 1 tablets daily, He stopped taking nifedipine 90 mg tablet last Friday because it made him choke.  We discussed trying taking the medication with applesauce.  Patient currently denies chest pain, shortness of breath, edema  Type 2 diabetes.  Currently on metformin 1000 mg twice daily, has Rybelsus ordered but not taking the medication.  Also not taking Repatha due to cost  I consulted with Catie the clinical pharmacist who spoke with the patient today, she  is working on getting patient restarted on Repatha and Rybelsus     Past Medical History:  Diagnosis Date   Acute blood loss anemia 04/14/2021   Benign neoplasm of sigmoid colon    Benign prostatic hyperplasia without lower urinary tract symptoms 06/08/2017   Diverticulosis of colon with  hemorrhage    Dyslipidemia    Esophageal stricture    Essential hypertension, benign    Expressive aphasia 07/14/2018   Gastroesophageal reflux disease with esophagitis without hemorrhage    History of TIA (transient ischemic attack) 04/14/2021   Hyperlipidemia    Hypertension 01/30/2015   Hypertensive emergency 07/14/2018   Prolonged QT interval 07/14/2018   RBBB    Statin intolerance 08/27/2022   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, 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] 09/24/2022   Thyroid cyst    TIA (transient ischemic attack) 07/14/2018   Type 2 diabetes mellitus (HCC)    Type 2 diabetes mellitus with hyperlipidemia (HCC) 01/30/2015   Vitamin D deficiency     Past Surgical History:  Procedure Laterality Date   COLONOSCOPY WITH PROPOFOL N/A 04/15/2021   Procedure: COLONOSCOPY WITH PROPOFOL;  Surgeon: Hilarie Fredrickson, MD;  Location: [facility];  Service: Gastroenterology;  Laterality: N/A;   ESOPHAGOGASTRODUODENOSCOPY (EGD) WITH PROPOFOL N/A 04/15/2021   Procedure: ESOPHAGOGASTRODUODENOSCOPY (EGD) WITH PROPOFOL;  Surgeon: Hilarie Fredrickson, MD;  Location: [facility];  Service: Gastroenterology;  Laterality: N/A;   MELANOMA EXCISION     Back   POLYPECTOMY  04/15/2021   Procedure: POLYPECTOMY;  Surgeon: Hilarie Fredrickson, MD;  Location: [facility];  Service: Gastroenterology;;   WISDOM TOOTH EXTRACTION      Family History  Problem Relation Age of Onset   Hypertension Mother    Pulmonary embolism Mother    Diabetes Father    Hypertension Father    Alzheimer's disease Father    Heart disease Brother    Alzheimer's disease Brother    Diabetes Maternal Grandmother    Colon cancer Neg Hx     Social History   Socioeconomic History   Marital status: Single    Spouse name: Not on file  Number of children: 4   Years of education: Not on file   Highest education level: Not on file  Occupational History    Employer: [employer]  Tobacco Use   Smoking status: Never   Smokeless tobacco: Never  Vaping Use   Vaping  status: Never Used  Substance and Sexual Activity   Alcohol use: Yes    Comment: occasional   Drug use: No   Sexual activity: Not on file  Other Topics Concern   Not on file  Social History Narrative   Lives home alone   Social Determinants of Health   Financial Resource Strain: Not on file  Food Insecurity: No Food Insecurity (10/12/2022)   Hunger Vital Sign    Worried About Running Out of Food in the Last Year: Never true    Ran Out of Food in the Last Year: Never true  Transportation Needs: No Transportation Needs (10/12/2022)   PRAPARE - Administrator, Civil Service (Medical): No    Lack of Transportation (Non-Medical): No  Physical Activity: Inactive (10/12/2022)   Exercise Vital Sign    Days of Exercise per Week: 0 days    Minutes of Exercise per Session: 0 min  Stress: Not on file  Social Connections: Not on file  Intimate Partner Violence: Not on file    Outpatient Medications Prior to Visit  Medication Sig Dispense Refill   clopidogrel (PLAVIX) 75 MG tablet Take 1 tablet (75 mg total) by mouth daily. 90 tablet 3   metFORMIN (GLUCOPHAGE) 1000 MG tablet Take 1 tablet (1,000 mg total) by mouth 2 (two) times daily with a meal. 180 tablet 1   olmesartan-hydrochlorothiazide (BENICAR HCT) 40-25 MG tablet Take 1 tablet by mouth daily. 90 tablet 1   Multiple Vitamin (MULTIVITAMIN) tablet Take 1 tablet by mouth daily.     NIFEdipine (PROCARDIA XL/NIFEDICAL-XL) 90 MG 24 hr tablet Take 1 tablet (90 mg total) by mouth daily. (Patient not taking: Reported on 12/28/2022) 90 tablet 3   Semaglutide (RYBELSUS) 3 MG TABS Take 1 tablet (3 mg total) by mouth daily. (Patient not taking: Reported on 12/28/2022)     No facility-administered medications prior to visit.    Allergies  Allergen Reactions   Statins     ROS Review of Systems  Constitutional:  Negative for appetite change, chills, fatigue and fever.  HENT:  Negative for congestion, postnasal drip, rhinorrhea and  sneezing.   Respiratory:  Negative for cough, shortness of breath and wheezing.   Cardiovascular:  Negative for chest pain, palpitations and leg swelling.  Gastrointestinal:  Negative for abdominal pain, constipation, nausea and vomiting.  Genitourinary:  Negative for difficulty urinating, dysuria, flank pain and frequency.  Musculoskeletal:  Negative for arthralgias, back pain, joint swelling and myalgias.  Skin:  Negative for color change, pallor, rash and wound.  Neurological:  Negative for dizziness, facial asymmetry, weakness, numbness and headaches.  Psychiatric/Behavioral:  Negative for behavioral problems, confusion, self-injury and suicidal ideas.       Objective:    Physical Exam Vitals and nursing note reviewed.  Constitutional:      General: He is not in acute distress.    Appearance: Normal appearance. He is not ill-appearing, toxic-appearing or diaphoretic.  HENT:     Mouth/Throat:     Mouth: Mucous membranes are moist.     Pharynx: Oropharynx is clear. No oropharyngeal exudate or posterior oropharyngeal erythema.  Eyes:     General: No scleral icterus.  Right eye: No discharge.        Left eye: No discharge.     Extraocular Movements: Extraocular movements intact.     Conjunctiva/sclera: Conjunctivae normal.  Cardiovascular:     Rate and Rhythm: Normal rate and regular rhythm.     Pulses: Normal pulses.     Heart sounds: Normal heart sounds. No murmur heard.    No friction rub. No gallop.  Pulmonary:     Effort: Pulmonary effort is normal. No respiratory distress.     Breath sounds: Normal breath sounds. No stridor. No wheezing, rhonchi or rales.  Chest:     Chest wall: No tenderness.  Abdominal:     General: There is no distension.     Palpations: Abdomen is soft.     Tenderness: There is no abdominal tenderness. There is no right CVA tenderness, left CVA tenderness or guarding.  Musculoskeletal:        General: No swelling, tenderness, deformity or  signs of injury.     Right lower leg: No edema.     Left lower leg: No edema.     Comments: Lipoma noted on left shoulder.  Skin is warm and dry no redness or swelling or tenderness noted  Skin:    General: Skin is warm and dry.     Capillary Refill: Capillary refill takes less than 2 seconds.     Coloration: Skin is not jaundiced or pale.     Findings: No bruising, erythema or lesion.  Neurological:     Mental Status: He is alert and oriented to person, place, and time.     Motor: No weakness.     Coordination: Coordination normal.     Gait: Gait normal.  Psychiatric:        Mood and Affect: Mood normal.        Behavior: Behavior normal.        Thought Content: Thought content normal.        Judgment: Judgment normal.     BP (!) 160/84 (BP Location: Right Arm, Patient Position: Sitting)   Pulse 71   Wt 146 lb 9.6 oz (66.5 kg)   SpO2 98%   BMI 24.40 kg/m  Wt Readings from Last 3 Encounters:  12/28/22 146 lb 9.6 oz (66.5 kg)  11/15/22 150 lb (68 kg)  10/25/22 147 lb 9.6 oz (67 kg)    Lab Results  Component Value Date   TSH 1.310 04/07/2022   Lab Results  Component Value Date   WBC 8.0 04/07/2022   HGB 15.0 04/07/2022   HCT 42.8 04/07/2022   MCV 93 04/07/2022   PLT 200 04/07/2022   Lab Results  Component Value Date   NA 142 08/27/2022   K 4.0 08/27/2022   CO2 26 08/27/2022   GLUCOSE 189 (H) 08/27/2022   BUN 12 08/27/2022   CREATININE 0.83 08/27/2022   BILITOT 0.6 08/27/2022   ALKPHOS 56 08/27/2022   AST 12 08/27/2022   ALT 10 08/27/2022   PROT 6.7 08/27/2022   ALBUMIN 4.5 08/27/2022   CALCIUM 9.7 08/27/2022   ANIONGAP 9 04/15/2021   EGFR 98 08/27/2022   Lab Results  Component Value Date   CHOL 298 (H) 04/07/2022   Lab Results  Component Value Date   HDL 43 04/07/2022   Lab Results  Component Value Date   LDLCALC 194 (H) 04/07/2022   Lab Results  Component Value Date   TRIG 308 (H) 04/07/2022   Lab Results  Component Value  Date   CHOLHDL  6.9 (H) 04/07/2022   Lab Results  Component Value Date   HGBA1C 7.7 (A) 12/28/2022      Assessment & Plan:   Problem List Items Addressed This Visit       Cardiovascular and Mediastinum   Hypertension    BP Readings from Last 3 Encounters:  12/28/22 (!) 160/84  12/17/22 (!) 174/93  11/15/22 (!) 144/77   HTN unControlled .  On Benicar HCT 40-25 mg 1 tablet daily, encouraged to restart nifedipine 90 mg daily take medication with applesauce Discussed DASH diet and dietary sodium restrictions Continue to increase dietary efforts and exercise.  Appreciate collaboration with cardiologist Blood pressure goal is less than 130/80 Encouraged to monitor blood pressure at home      Relevant Orders   Basic metabolic panel     Endocrine   Type 2 diabetes mellitus with hyperlipidemia (HCC)    Lab Results  Component Value Date   HGBA1C 7.7 (A) 12/28/2022  Continue metformin 1000 mg twice daily Start Rybelsus 3 mg daily Restarting Repatha 140 mg injection every 2 weeks Appreciate collaboration with the clinical pharmacist Patient counseled on low-carb modified diet Encouraged to engage in regular moderate exercise at least 150 minutes weekly Due for diabetic eye exam, patient encouraged to get this exam done We will follow-up with the patient in 6 weeks      Relevant Orders   POCT glycosylated hemoglobin (Hb A1C) (Completed)     Other   Hyperlipidemia    LDL goal is less than 55 Restart Repatha injection Avoid fatty fried foods Appreciate collaboration with the clinical pharmacist      History of TIA (transient ischemic attack)    Continue Plavix 75 mg daily Starting Repatha 140 mg every 2 weeks for hyperlipidemia      Lipoma of left upper extremity - Primary    Currently denies pain Would like lipoma to be surgically removed Referral sent to general surgery      Relevant Orders   Ambulatory referral to General Surgery    No orders of the defined types were  placed in this encounter.   Follow-up: No follow-ups on file.    Donell Beers, FNP

## 2022-12-28 NOTE — Progress Notes (Signed)
   12/28/2022 Name: Darrell [name] MRN: [id number] DOB: 1955-12-02  Chief Complaint  Patient presents with   Medication Management    Darrell [name] is a 67 y.o. year old male who was referred for medication management by their primary care provider, Donell Beers, FNP. They presented for a face to face visit today.   They were referred to the pharmacist by their PCP for assistance in managing medication access    Subjective:  Care Team: Primary Care Provider: Donell Beers, FNP  Medication Access/Adherence  Current Pharmacy:  [facility] [address] [place] [address] Phone: [phone] Fax: [phone]   Patient reports affordability concerns with their medications: Yes  Patient reports access/transportation concerns to their pharmacy: No  Patient reports adherence concerns with their medications:  Yes    Patient previously on an [employer], appears he has a Paediatric nurse plan at this time  Diabetes:  Current medications: metformin 1000 mg twice daily Medications tried in the past: Trulicity, Rybelsus - cost concerns   Current medication access support: previously discussed income and he would qualify for GLP1 assistance.   Hypertension:  Current medications: nifedipine XL 90 mg daily, olmesartan hydrochlorothiazide 40/25 mg daily  Patient does not have a validated, automated, upper arm home BP cuff  Patient denies hypertensive symptoms including headache, chest pain, shortness of breath   Hyperlipidemia/ASCVD Risk Reduction  Current lipid lowering medications: Repatha 140 mg every 14 days - previously prescribed, notes cost became too expensive  Previous medications:  atorvastatin, rosuvastatin - myalgias   Antiplatelet regimen: clopidogrel 75 mg daily  Objective:  Lab Results  Component Value Date   HGBA1C 7.7 (A) 12/28/2022    Lab Results  Component Value Date   CREATININE 0.83 08/27/2022    BUN 12 08/27/2022   NA 142 08/27/2022   K 4.0 08/27/2022   CL 104 08/27/2022   CO2 26 08/27/2022    Lab Results  Component Value Date   CHOL 298 (H) 04/07/2022   HDL 43 04/07/2022   LDLCALC 194 (H) 04/07/2022   TRIG 308 (H) 04/07/2022   CHOLHDL 6.9 (H) 04/07/2022    Assessment/Plan:   Diabetes: - Currently uncontrolled - Recommend to restart Rybelsus. Completed patient assistance application today for Rybelsus 3 mg/7 mg for 2024 and 2025, will follow for approval.   Hypertension: - Currently uncontrolled. Discussed mixing uncrushed nifedipine with applesauce to aid in tolerability. He notes he can do this.  - Reviewed long term cardiovascular and renal outcomes of uncontrolled blood pressure - Discussed purchasing home BP cuff. Discussed [employer].  - Recommend to continue current regimen and follow up with hypertension team    Hyperlipidemia/ASCVD Risk Reduction: - Currently untreated.  - Recommend to restart Repatha. Assisted in enrollment with [facility], information provided to pharmacy. They will fill for him today. Enrolled through 11/27/2023  Follow Up Plan: phone call in 6 weeks  Catie TClearance Coots, PharmD, BCACP, CPP Clinical Pharmacist [facility] [phone]

## 2022-12-28 NOTE — Assessment & Plan Note (Signed)
BP Readings from Last 3 Encounters:  12/28/22 (!) 160/84  12/17/22 (!) 174/93  11/15/22 (!) 144/77   HTN unControlled .  On Benicar HCT 40-25 mg 1 tablet daily, encouraged to restart nifedipine 90 mg daily take medication with applesauce Discussed DASH diet and dietary sodium restrictions Continue to increase dietary efforts and exercise.  Appreciate collaboration with cardiologist Blood pressure goal is less than 130/80 Encouraged to monitor blood pressure at home

## 2022-12-28 NOTE — Assessment & Plan Note (Addendum)
Lab Results  Component Value Date   HGBA1C 7.7 (A) 12/28/2022  Continue metformin 1000 mg twice daily Start Rybelsus 3 mg daily Restarting Repatha 140 mg injection every 2 weeks Appreciate collaboration with the clinical pharmacist Patient counseled on low-carb modified diet Encouraged to engage in regular moderate exercise at least 150 minutes weekly Due for diabetic eye exam, patient encouraged to get this exam done We will follow-up with the patient in 6 weeks

## 2022-12-28 NOTE — Assessment & Plan Note (Signed)
Currently denies pain Would like lipoma to be surgically removed Referral sent to general surgery

## 2022-12-29 ENCOUNTER — Other Ambulatory Visit: Payer: Self-pay

## 2022-12-29 ENCOUNTER — Other Ambulatory Visit (HOSPITAL_COMMUNITY): Payer: Self-pay

## 2022-12-29 LAB — BASIC METABOLIC PANEL
BUN/Creatinine Ratio: 14 (ref 10–24)
BUN: 12 mg/dL (ref 8–27)
CO2: 26 mmol/L (ref 20–29)
Calcium: 10.1 mg/dL (ref 8.6–10.2)
Chloride: 102 mmol/L (ref 96–106)
Creatinine, Ser: 0.86 mg/dL (ref 0.76–1.27)
Glucose: 162 mg/dL — ABNORMAL HIGH (ref 70–99)
Potassium: 3.6 mmol/L (ref 3.5–5.2)
Sodium: 147 mmol/L — ABNORMAL HIGH (ref 134–144)
eGFR: 96 mL/min/{1.73_m2} (ref >59)

## 2023-01-18 ENCOUNTER — Ambulatory Visit: Payer: Medicare PPO | Admitting: General Surgery

## 2023-01-20 ENCOUNTER — Ambulatory Visit (INDEPENDENT_AMBULATORY_CARE_PROVIDER_SITE_OTHER): Payer: Medicare PPO | Admitting: General Surgery

## 2023-01-20 ENCOUNTER — Other Ambulatory Visit: Payer: Self-pay | Admitting: Pharmacist

## 2023-01-20 ENCOUNTER — Other Ambulatory Visit (HOSPITAL_COMMUNITY): Payer: Self-pay

## 2023-01-20 ENCOUNTER — Encounter: Payer: Self-pay | Admitting: General Surgery

## 2023-01-20 VITALS — BP 160/94 | HR 66 | Temp 98.5°F | Ht 65.0 in | Wt 144.2 lb

## 2023-01-20 DIAGNOSIS — R2232 Localized swelling, mass and lump, left upper limb: Secondary | ICD-10-CM | POA: Diagnosis not present

## 2023-01-20 DIAGNOSIS — M7989 Other specified soft tissue disorders: Secondary | ICD-10-CM

## 2023-01-20 MED ORDER — RYBELSUS 7 MG PO TABS
7.0000 mg | ORAL_TABLET | Freq: Every day | ORAL | 0 refills | Status: DC
  Filled 2023-01-20: qty 30, 30d supply, fill #0

## 2023-01-20 MED ORDER — RYBELSUS 3 MG PO TABS
3.0000 mg | ORAL_TABLET | Freq: Every day | ORAL | 0 refills | Status: DC
  Filled 2023-01-20: qty 30, 30d supply, fill #0

## 2023-01-20 NOTE — Patient Instructions (Signed)
We will schedule you for a removal of your lipoma in office. You do not need a driver for this day but may have someone with you if you like.   Stop your Plavix today for your procedure.    Lipoma  A lipoma is a noncancerous (benign) tumor that is made up of fat cells. This is a very common type of soft-tissue growth. Lipomas are usually found under the skin (subcutaneous). They may occur in any tissue of the body that contains fat. Common areas for lipomas to appear include the back, arms, shoulders, buttocks, and thighs. Lipomas grow slowly, and they are usually painless. Most lipomas do not cause problems and do not require treatment. What are the causes? The cause of this condition is not known. What increases the risk? You are more likely to develop this condition if: You are 61-22 years old. You have a family history of lipomas. What are the signs or symptoms? A lipoma usually appears as a small, round bump under the skin. In most cases, the lump will: Feel soft or rubbery. Not cause pain or other symptoms. However, if a lipoma is located in an area where it pushes on nerves, it can become painful or cause other symptoms. How is this diagnosed? A lipoma can usually be diagnosed with a physical exam. You may also have tests to confirm the diagnosis and to rule out other conditions. Tests may include: Imaging tests, such as a CT scan or an MRI. Removal of a tissue sample to be looked at under a microscope (biopsy). How is this treated? Treatment for this condition depends on the size of the lipoma and whether it is causing any symptoms. For small lipomas that are not causing problems, no treatment is needed. If a lipoma is bigger or it causes problems, surgery may be done to remove the lipoma. Lipomas can also be removed to improve appearance. Most often, the procedure is done after applying a medicine that numbs the area (local anesthetic). Liposuction may be done to reduce the size  of the lipoma before it is removed through surgery, or it may be done to remove the lipoma. Lipomas are removed with this method to limit incision size and scarring. A liposuction tube is inserted through a small incision into the lipoma, and the contents of the lipoma are removed through the tube with suction. Follow these instructions at home: Watch your lipoma for any changes. Keep all follow-up visits. This is important. Where to find more information OrthoInfo: orthoinfo.aaos.org Contact a health care provider if: Your lipoma becomes larger or hard. Your lipoma becomes painful, red, or increasingly swollen. These could be signs of infection or a more serious condition. Get help right away if: You develop tingling or numbness in an area near the lipoma. This could indicate that the lipoma is causing nerve damage. Summary A lipoma is a noncancerous tumor that is made up of fat cells. Most lipomas do not cause problems and do not require treatment. If a lipoma is bigger or it causes problems, surgery may be done to remove the lipoma. Contact a health care provider if your lipoma becomes larger or hard, or if it becomes painful, red, or increasingly swollen. These could be signs of infection or a more serious condition. This information is not intended to replace advice given to you by your health care provider. Make sure you discuss any questions you have with your health care provider. Document Revised: 02/06/2021 Document Reviewed: 02/06/2021 Elsevier Patient  Education  2024 [employer].

## 2023-01-20 NOTE — Progress Notes (Signed)
Pharmacy Medication Assistance Program Note    01/20/2023  Patient ID: Darrell [name], male   DOB: 1956-01-24, 67 y.o.   MRN: [id number]     01/20/2023  Outreach Medication One  Initial Outreach Date (Medication One) 01/06/2023  Manufacturer Medication One [employer] [facility] Rybelsus  Dose of Rybelsus 3 mg and 7 mg  Type of Assistance Manufacturer Assistance  Date Application Sent to Patient 01/06/2023  Application Items Requested Application  Date Application Sent to Prescriber 01/06/2023  Name of Prescriber Halina Andreas  Date Application Received From Patient 01/06/2023  Application Items Received From Patient Application  Date Application Received From Provider 01/06/2023  Date Application Submitted to Manufacturer 01/06/2023  Method Application Sent to Manufacturer Online     Voucher information provided for Rybelsus 3 mg and 7 mg doses. Provided to pharmacy. Application is in benefits verification and will be processed in the next 24-48 business hours.   7 mg - BIN: [id number]; PCN: [id number]; group: [id number]; ID: [id number]  3 mg- BIN: [id number]; PCN: [id number]; Group: [id number]; ID: [id number]  Patient also notes that he has been taking nifedipine with applesauce and a glass of water but it is still getting caught in his esophagus, he is throwing it up. Will pass along to cardiology.   Catie Eppie Gibson, PharmD, BCACP, CPP Clinical Pharmacist [facility] [phone]

## 2023-01-24 ENCOUNTER — Other Ambulatory Visit (HOSPITAL_COMMUNITY): Payer: Self-pay

## 2023-01-24 ENCOUNTER — Telehealth: Payer: Self-pay | Admitting: Pharmacist Clinician (PhC)/ Clinical Pharmacy Specialist

## 2023-01-24 MED ORDER — NIFEDIPINE ER OSMOTIC RELEASE 60 MG PO TB24
60.0000 mg | ORAL_TABLET | Freq: Two times a day (BID) | ORAL | 1 refills | Status: DC
  Filled 2023-01-24: qty 180, 90d supply, fill #0

## 2023-01-24 NOTE — Progress Notes (Signed)
Patient ID: Darrell [name], male   DOB: 01-17-1956, 67 y.o.   MRN: [id number] CC: Left Shoulder Mass History of Present Illness Darrell [name] is a 67 y.o. male with past medical history as below significant for history of stroke on Plavix who presents with a left shoulder mass.  He reports that this left shoulder mass has been there for several years.  He says that it has grown slowly.  He reports that sometimes he has pain when he presses or lays on it.  There has been no overlying skin changes or drainage from the mass..  Past Medical History Past Medical History:  Diagnosis Date   Acute blood loss anemia 04/14/2021   Benign neoplasm of sigmoid colon    Benign prostatic hyperplasia without lower urinary tract symptoms 06/08/2017   Diverticulosis of colon with hemorrhage    Dyslipidemia    Esophageal stricture    Essential hypertension, benign    Expressive aphasia 07/14/2018   Gastroesophageal reflux disease with esophagitis without hemorrhage    History of TIA (transient ischemic attack) 04/14/2021   Hyperlipidemia    Hypertension 01/30/2015   Hypertensive emergency 07/14/2018   Prolonged QT interval 07/14/2018   RBBB    Statin intolerance 08/27/2022   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, T46.6X5A] 09/24/2022   Thyroid cyst    TIA (transient ischemic attack) 07/14/2018   Type 2 diabetes mellitus (HCC)    Type 2 diabetes mellitus with hyperlipidemia (HCC) 01/30/2015   Vitamin D deficiency        Past Surgical History:  Procedure Laterality Date   COLONOSCOPY WITH PROPOFOL N/A 04/15/2021   Procedure: COLONOSCOPY WITH PROPOFOL;  Surgeon: Hilarie Fredrickson, MD;  Location: [facility];  Service: Gastroenterology;  Laterality: N/A;   ESOPHAGOGASTRODUODENOSCOPY (EGD) WITH PROPOFOL N/A 04/15/2021   Procedure: ESOPHAGOGASTRODUODENOSCOPY (EGD) WITH PROPOFOL;  Surgeon: Hilarie Fredrickson, MD;  Location: [facility];  Service: Gastroenterology;  Laterality: N/A;   MELANOMA EXCISION     Back   POLYPECTOMY   04/15/2021   Procedure: POLYPECTOMY;  Surgeon: Hilarie Fredrickson, MD;  Location: [facility];  Service: Gastroenterology;;   WISDOM TOOTH EXTRACTION      Allergies  Allergen Reactions   Statins     Current Outpatient Medications  Medication Sig Dispense Refill   clopidogrel (PLAVIX) 75 MG tablet Take 1 tablet (75 mg total) by mouth daily. 90 tablet 3   Evolocumab (REPATHA SURECLICK) 140 MG/ML SOAJ Inject 140 mg into the skin every 14 (fourteen) days. 6 mL 2   metFORMIN (GLUCOPHAGE) 1000 MG tablet Take 1 tablet (1,000 mg total) by mouth 2 (two) times daily with a meal. 180 tablet 1   Multiple Vitamin (MULTIVITAMIN) tablet Take 1 tablet by mouth daily.     NIFEdipine (PROCARDIA XL/NIFEDICAL-XL) 90 MG 24 hr tablet Take 1 tablet (90 mg total) by mouth daily. 90 tablet 3   olmesartan-hydrochlorothiazide (BENICAR HCT) 40-25 MG tablet Take 1 tablet by mouth daily. 90 tablet 1   Semaglutide (RYBELSUS) 3 MG TABS Take 1 tablet (3 mg total) by mouth daily. (Patient not taking: Reported on 01/20/2023) 30 tablet 0   Semaglutide (RYBELSUS) 7 MG TABS Take 1 tablet (7 mg total) by mouth daily. (Patient not taking: Reported on 01/20/2023) 30 tablet 0   No current facility-administered medications for this visit.    Family History Family History  Problem Relation Age of Onset   Hypertension Mother    Pulmonary embolism Mother    Diabetes Father    Hypertension Father  Alzheimer's disease Father    Heart disease Brother    Alzheimer's disease Brother    Diabetes Maternal Grandmother    Colon cancer Neg Hx        Social History Social History   Tobacco Use   Smoking status: Never    Passive exposure: Never   Smokeless tobacco: Never  Vaping Use   Vaping status: Never Used  Substance Use Topics   Alcohol use: Yes    Comment: occasional   Drug use: No        ROS Full ROS of systems performed and is otherwise negative there than what is stated in the HPI  Physical Exam Blood  pressure (!) 160/94, pulse 66, temperature 98.5 F (36.9 C), height 5\' 5"  (1.651 m), weight 144 lb 3.2 oz (65.4 kg), SpO2 98%.  No acute distress, alert and oriented x 3, moving all extremities spontaneously, regular rate and rhythm, good auscultation bilaterally, left shoulder there is a soft, mobile nonadherent subcutaneous tissue mass that feels consistent with a lipoma.  It measures approximately 5 cm. Data Reviewed Reviewed his past medical history  I have personally reviewed the patient's imaging and medical records.    Assessment/Plan    Patient with left arm and shoulder mass.  This feels consistent with a lipoma as it is superficial and not adherent to the underlying tissue.  We will plan for excision in the office.  I discussed risk, benefits alternatives of the procedure including risk of infection, bleeding, recurrence.  He is on Plavix so we will have him hold his Plavix for 6 days prior to the procedure and schedule him for an in office visit.      Kandis Cocking 01/24/2023, 8:23 AM

## 2023-01-24 NOTE — Telephone Encounter (Signed)
-----   Message from [employer] sent at 01/20/2023 11:42 AM EST ----- Hi Belenda Cruise - he's been trying to take nifedipine 90 mg with applesauce/something soft and following with water, but says it's still getting stuck and he's throwing up. Said the 60 mg was tolerated better. I told him y'all would be in touch with recommendations.   Thanks! Catie

## 2023-01-24 NOTE — Telephone Encounter (Signed)
Spoke with patient.  He did fine with the 60 mg tablets, but has difficulties swallowing the 90 mg dose.  Will shift medication to nifedipine xl 60 mg bid.  Patient appreciative of change.

## 2023-01-27 ENCOUNTER — Ambulatory Visit: Payer: Medicare PPO | Admitting: General Surgery

## 2023-01-31 ENCOUNTER — Encounter: Payer: Self-pay | Admitting: Pharmacist Clinician (PhC)/ Clinical Pharmacy Specialist

## 2023-01-31 NOTE — Assessment & Plan Note (Signed)
Assessment: BP is uncontrolled in office BP 174/93 mmHg;  above the goal (<130/80). Tolerates current medications well without any side effects Denies SOB, palpitation, chest pain, headaches,or swelling Reiterated the importance of regular exercise and low salt diet   Plan:  Increase nifedipine to 90 mg daily Continue taking olmesartan hctz 40/25 Patient to keep record of BP readings with heart rate and report to [place] at the next visit Patient to follow up with Dr. Duke Salvia in 2 months  Labs ordered today:  none

## 2023-02-03 ENCOUNTER — Ambulatory Visit: Payer: Medicare PPO | Admitting: General Surgery

## 2023-02-08 ENCOUNTER — Encounter: Payer: Self-pay | Admitting: General Surgery

## 2023-02-08 ENCOUNTER — Ambulatory Visit (INDEPENDENT_AMBULATORY_CARE_PROVIDER_SITE_OTHER): Payer: Medicare Other | Admitting: General Surgery

## 2023-02-08 VITALS — BP 194/106 | HR 71 | Temp 98.9°F | Ht 65.0 in | Wt 142.0 lb

## 2023-02-08 DIAGNOSIS — M7989 Other specified soft tissue disorders: Secondary | ICD-10-CM | POA: Diagnosis not present

## 2023-02-08 DIAGNOSIS — D1722 Benign lipomatous neoplasm of skin and subcutaneous tissue of left arm: Secondary | ICD-10-CM | POA: Diagnosis not present

## 2023-02-08 DIAGNOSIS — R2232 Localized swelling, mass and lump, left upper limb: Secondary | ICD-10-CM | POA: Diagnosis not present

## 2023-02-08 NOTE — Patient Instructions (Signed)
 Today we have removed a Lipoma in our office. Please see information below regarding this type of tumor.  You are free to shower 02/09/2023. Do not scrub at the area.   You have glue on your skin and sutures under the skin. The glue will come off on it's own in 10-14 days. You may shower normally until this occurs but do not submerge.  Please use Tylenol  or Ibuprofen for pain as needed. You may use ice to the area 3-4 times today and tomorrow for any achiness.   We will see you back in 7-10 days to ensure that this has healed and to review the final pathology. Please see your appointment below. You may continue your regular activities right away but if you are having pain while doing something, stop what you are doing and try this activity once again in 3 days. Please call our office with any questions or concerns prior to your appointment.  Call to report any excessive bleeding, spreading redness, or increased pain.    Lipoma Removal Lipoma removal is a surgical procedure to remove a noncancerous (benign) tumor that is made up of fat cells (lipoma). Most lipomas are small and painless and do not require treatment. They can form in many areas of the body but are most common under the skin of the back, shoulders, arms, and thighs. You may need lipoma removal if you have a lipoma that is large, growing, or causing discomfort. Lipoma removal may also be done for cosmetic reasons. Tell a health care provider about: Any allergies you have. All medicines you are taking, including vitamins, herbs, eye drops, creams, and over-the-counter medicines. Any problems you or family members have had with anesthetic medicines. Any blood disorders you have. Any surgeries you have had. Any medical conditions you have. Whether you are pregnant or may be pregnant. What are the risks? Generally, this is a safe procedure. However, problems may occur, including: Infection. Bleeding. Allergic reactions to  medicines. Damage to nerves or blood vessels near the lipoma. Scarring.  Medicines Ask your health care provider about: Changing or stopping your regular medicines. This is especially important if you are taking diabetes medicines or blood thinners. Taking medicines such as aspirin  and ibuprofen. These medicines can thin your blood. Do not take these medicines before your procedure if your health care provider instructs you not to. You may be given antibiotic medicine to help prevent infection. General instructions Ask your health care provider how your surgical site will be marked or identified. You will have a physical exam. Your health care provider will check the size of the lipoma and whether it can be moved easily.  What happens during the procedure? To reduce your risk of infection: Your health care team will wash or sanitize their hands. Your skin will be washed with surgical soap. You will be given the following: A medicine to numb the area (local anesthetic). An incision will be made over the lipoma or very near the lipoma. The incision may be made in a natural skin line or crease. Tissues, nerves, and blood vessels near the lipoma will be moved out of the way. The lipoma and the capsule that surrounds it will be separated from the surrounding tissues. The lipoma will be removed. The incision may be closed with stitches and surgical glue

## 2023-02-08 NOTE — Progress Notes (Signed)
 Procedure note  Preoperative diagnosis: Left arm soft tissue mass Postoperative diagnosis: Same Surgeon: Jayson Endow, MD Procedure: Excision of left arm soft tissue mass extending down into the muscle.  Total size of lesion approximately 5.5 cm  After informed consent was obtained the patient was placed supine on our procedure room table.  His left arm was then prepped and draped in the usual sterile fashion.  A timeout was called identifying correct patient, site, side and procedure.  A field block with 1% lidocaine  with epinephrine was used for patient comfort.  An incision was made over the soft tissue mass.  The soft tissue mass was dissected out from the surrounding subcutaneous tissue.  The mass did extend into the muscle.  It was dissected off the muscle fibers.  Once the soft tissue mass was adequately dissected off the underlying tissue it was eviscerated from the incision.  The last attachments to the muscle belly were then taken with a knife.  The mass was excised fully and measured approximately 5.5 cm.  It was then passed off the table as specimen.  The cavity was then irrigated with warm saline solution and no bleeding was noted.  The subcutaneous tissue was then closed with 3-0 Vicryl and the skin was closed with 4-0 Monocryl.  The skin was then dressed with surgical glue.  The patient tolerated the procedure well.

## 2023-02-10 LAB — SURGICAL PATHOLOGY

## 2023-02-14 ENCOUNTER — Ambulatory Visit: Payer: Self-pay | Admitting: Nurse Practitioner

## 2023-02-14 NOTE — Telephone Encounter (Signed)
 Error

## 2023-02-14 NOTE — Telephone Encounter (Signed)
 Patient needs to reschedule appt on 02/15/23 due to transportation issues.

## 2023-02-15 ENCOUNTER — Other Ambulatory Visit: Payer: Self-pay | Admitting: Nurse Practitioner

## 2023-02-15 ENCOUNTER — Other Ambulatory Visit: Payer: Self-pay

## 2023-02-15 ENCOUNTER — Other Ambulatory Visit (INDEPENDENT_AMBULATORY_CARE_PROVIDER_SITE_OTHER): Payer: Medicare Other | Admitting: Pharmacist

## 2023-02-15 ENCOUNTER — Other Ambulatory Visit (HOSPITAL_COMMUNITY): Payer: Self-pay

## 2023-02-15 DIAGNOSIS — E785 Hyperlipidemia, unspecified: Secondary | ICD-10-CM

## 2023-02-15 DIAGNOSIS — I10 Essential (primary) hypertension: Secondary | ICD-10-CM

## 2023-02-15 MED ORDER — BLOOD GLUCOSE TEST VI STRP
ORAL_STRIP | 3 refills | Status: AC
  Filled 2023-02-15: qty 100, 50d supply, fill #0
  Filled 2023-04-01: qty 100, 50d supply, fill #1
  Filled 2023-05-21: qty 100, 50d supply, fill #2
  Filled 2023-07-11: qty 100, 50d supply, fill #3

## 2023-02-15 MED ORDER — LANCET DEVICE MISC
0 refills | Status: AC
  Filled 2023-02-15: qty 1, 28d supply, fill #0
  Filled 2023-02-16: qty 1, fill #0
  Filled 2023-02-17: qty 1, 30d supply, fill #0

## 2023-02-15 MED ORDER — OLMESARTAN MEDOXOMIL-HCTZ 40-25 MG PO TABS
1.0000 | ORAL_TABLET | Freq: Every day | ORAL | 1 refills | Status: DC
  Filled 2023-02-15: qty 90, 90d supply, fill #0
  Filled 2023-05-11: qty 90, 90d supply, fill #1

## 2023-02-15 MED ORDER — ONETOUCH DELICA LANCETS 30G MISC
3 refills | Status: AC
  Filled 2023-02-15: qty 100, 30d supply, fill #0
  Filled 2023-03-12 – 2023-03-17 (×2): qty 100, 30d supply, fill #1
  Filled 2023-04-11: qty 100, 30d supply, fill #2

## 2023-02-15 MED ORDER — ACCU-CHEK AVIVA PLUS W/DEVICE KIT
PACK | 0 refills | Status: AC
  Filled 2023-02-15: qty 1, 1d supply, fill #0

## 2023-02-15 NOTE — Telephone Encounter (Signed)
 Pt was seen by Catie and will follow up with [facility] 04/01/23. KH

## 2023-02-15 NOTE — Progress Notes (Signed)
 02/15/2023 Name: Darrell [name] MRN: [id number] DOB: Mar 29, 1955  Chief Complaint  Patient presents with   Medication Management   Diabetes    Darrell [name] is a 68 y.o. year old male who presented for a telephone visit.   They were referred to the pharmacist by their PCP for assistance in managing diabetes, hypertension, and hyperlipidemia.    Subjective:  Care Team: Primary Care Provider: Paseda, Folashade R, FNP ; Next Scheduled Visit: 03/2023 Cardiologist: Raford; Next Scheduled Visit: 02/17/23  Medication Access/Adherence  Current Pharmacy:  [facility] - [facility] [address] [place] [address] Phone: [phone] Fax: [phone]   Patient reports affordability concerns with their medications: No  Patient reports access/transportation concerns to their pharmacy: No  Patient reports adherence concerns with their medications:  Yes  - notes he is out of metformin , olmesartan /hydrochlorothiazide , clopidogrel , and Repatha . He has refills. He was unaware to call the pharmacy for refills.    Diabetes:  Current medications: metformin  1000 mg twice daily - has been out of this for an unknown period of time; Rybelsus  3 mg daily - reports he is finishing up this dose and will increase to 7 mg after that.   Denies GI upset, nausea, constipation.   Current glucose readings: has not been checking, notes he needs new supplies.    Current medication access support: applied for [employer] assistance for Rybelsus   Hypertension:  Current medications: olmesartan /hydrochlorothiazide  40/25 and nifedipine  60 mg daily - reports he has been taking nifedipine  but has been out of olmesartan /hydrochlorothiazide  for an unknown period of time  Patient has a validated, automated, upper arm home BP cuff but has not been checking. Notes he wants a machine with memory so he doesn't have to remember to write down his readings.   Hyperlipidemia/ASCVD Risk  Reduction  Current lipid lowering medications: Repatha  140 mg every 14 days - notes he ran out a few weeks ago  Previous medications:  atorvastatin , rosuvastatin  - myalgias   Antiplatelet agent: clopidogrel  75 mg daily   Approved for [employer] though 11/27/2023  Objective:  Lab Results  Component Value Date   HGBA1C 7.7 (A) 12/28/2022    Lab Results  Component Value Date   CREATININE 0.86 12/28/2022   BUN 12 12/28/2022   NA 147 (H) 12/28/2022   K 3.6 12/28/2022   CL 102 12/28/2022   CO2 26 12/28/2022    Lab Results  Component Value Date   CHOL 298 (H) 04/07/2022   HDL 43 04/07/2022   LDLCALC 194 (H) 04/07/2022   TRIG 308 (H) 04/07/2022   CHOLHDL 6.9 (H) 04/07/2022    Medications Reviewed Today     Reviewed by Rudy Dorothyann DASEN, RPH-CPP (Pharmacist) on 02/15/23 at 0840  Med List Status: <None>   Medication Order Taking? Sig Documenting Provider Last Dose Status Informant  clopidogrel  (PLAVIX ) 75 MG tablet [id number] No Take 1 tablet (75 mg total) by mouth daily.  Patient not taking: Reported on 02/15/2023   Camara, Amadou, MD Not Taking Active   Evolocumab  (REPATHA  SURECLICK) 140 MG/ML SOAJ [id number] No Inject 140 mg into the skin every 14 (fourteen) days.  Patient not taking: Reported on 02/15/2023   Paseda, Folashade R, FNP Not Taking Active   metFORMIN  (GLUCOPHAGE ) 1000 MG tablet [id number] No Take 1 tablet (1,000 mg total) by mouth 2 (two) times daily with a meal.  Patient not taking: Reported on 02/15/2023   Paseda, Folashade R, FNP Not Taking Active  Multiple Vitamin (MULTIVITAMIN) tablet [id number]  Take 1 tablet by mouth daily. [provider]  Active Self  NIFEdipine  (PROCARDIA  XL/NIFEDICAL XL) 60 MG 24 hr tablet [id number] Yes Take 1 tablet (60 mg total) by mouth in the morning and at bedtime. Raford Riggs, MD Taking Active   olmesartan -hydrochlorothiazide  (BENICAR  HCT) 40-25 MG tablet [id number] No Take 1 tablet by mouth daily.   Patient not taking: Reported on 02/15/2023   Paseda, Folashade R, FNP Not Taking Active   Semaglutide  (RYBELSUS ) 3 MG TABS [id number] Yes Take 1 tablet (3 mg total) by mouth daily. Paseda, Folashade R, FNP Taking Active   Semaglutide  (RYBELSUS ) 7 MG TABS [id number]  Take 1 tablet (7 mg total) by mouth daily. Paseda, Folashade R, FNP  Active               Assessment/Plan:   SDOH: Collaborated with pharmacy to put him on automatic refill. They will fill and mail metformin , olmesartan /hydrochlorothiazide , clopidogrel , and Repatha  today, as he notes he does not have transportation to the pharmacy at this time.   Diabetes: - Currently uncontrolled due to medication access - Extensive education provided to patient to contact the pharmacy when due for refills.  - Recommend to restart metformin , continue Rybelsus  3 mg until complete then increase to 7 mg.  - Contacted [employer] to follow up on status of patient's application. Patient has been approved for 3 mg/7 mg dose of Rybelsus , will send order in for 7 mg 120 day supply. [email or web address]  Hypertension: - Currently uncontrolled due to medication access - Collaborated with pharmacy to refill and mail olmesartan /hydrochlorothiazide     Hyperlipidemia/ASCVD Risk Reduction: - Currently uncontrolled due to medication access - Collaborated with pharmacy to fill and mail Repatha  and clopidogrel      Follow Up Plan: follow up with PCP as scheduled   Catie [facility], PharmD, BCACP, CPP Clinical Pharmacist [facility] [phone]

## 2023-02-16 ENCOUNTER — Other Ambulatory Visit: Payer: Self-pay

## 2023-02-17 ENCOUNTER — Encounter: Payer: Self-pay | Admitting: General Surgery

## 2023-02-17 ENCOUNTER — Ambulatory Visit: Payer: Medicare Other | Admitting: General Surgery

## 2023-02-17 ENCOUNTER — Other Ambulatory Visit: Payer: Self-pay

## 2023-02-17 ENCOUNTER — Other Ambulatory Visit (HOSPITAL_COMMUNITY): Payer: Self-pay

## 2023-02-17 ENCOUNTER — Encounter: Payer: Medicare Other | Admitting: General Surgery

## 2023-02-17 ENCOUNTER — Encounter (HOSPITAL_BASED_OUTPATIENT_CLINIC_OR_DEPARTMENT_OTHER): Payer: Medicare Other | Admitting: Cardiovascular Disease

## 2023-02-17 VITALS — BP 158/80 | HR 98 | Temp 98.2°F | Ht 65.0 in | Wt 140.0 lb

## 2023-02-17 DIAGNOSIS — Z09 Encounter for follow-up examination after completed treatment for conditions other than malignant neoplasm: Secondary | ICD-10-CM

## 2023-02-17 DIAGNOSIS — M7989 Other specified soft tissue disorders: Secondary | ICD-10-CM

## 2023-02-17 NOTE — Patient Instructions (Addendum)
You may resume all regular activities.    Follow-up with our office as needed.  Please call and ask to speak with a nurse if you develop questions or concerns.

## 2023-02-17 NOTE — Progress Notes (Deleted)
Advanced [facility]pertension [facility] Initial Assessment:    Date:  02/17/2023   ID:  Darrell [name], DOB 1955/12/19, MRN [id number]  PCP:  Darrell Beers, FNP  Cardiologist:  None  Nephrologist:  Referring MD: Darrell Beers, FNP   CC: Hypertension  History of Present Illness:    Darrell [name] is a 68 y.o. male with a hx of TIA, hypertension, hyperlipidemia, diabetes, vitamin D deficiency, thyroid cyst, here for follow up.  He was first seen in the [facility] 10/2022. He was seen by his PCP 08/27/2022 and his blood pressure was elevated to 171/103. He had run out of his medications for several weeks (nifedipine 60 mg daily, olmesartan-HCTZ 40-25 mg daily). He was given a one-time dose of clonidine 0.2 mg and his other medications were refilled. Noted to have a history of TIA and had been on Plavix 75 mg. At his follow-up with his PCP 09/2022 his blood pressure was 141/85. He was not taking medications consistently due to being unable to afford them.  Previously had an echocardiogram 07/2018 showing LVEF 60-65%, severely increased left ventricular wall thickness, and diastolic parameters consistent with impaired relaxation. Mean left atrial pressure was elevated. No valvular abnormalities.  He reported having high blood pressure and since his 40s  At his initial visit he had been prescribed nifedipine but it had not yet been started.  This was started.  He was referred for renal Dopplers which have not yet been completed.  He was seen at [facility] outpatient pharmacy and was noted that he was still struggling to afford medications.  They assisted him with procuring his medications and getting patient assistance requested and olmesartan/HCTZ was refilled on 1/14.  Previous antihypertensives: N/a  Past Medical History:  Diagnosis Date   Acute blood loss anemia 04/14/2021   Benign neoplasm of sigmoid colon    Benign prostatic hyperplasia without lower urinary tract symptoms  06/08/2017   Diverticulosis of colon with hemorrhage    Dyslipidemia    Esophageal stricture    Essential hypertension, benign    Expressive aphasia 07/14/2018   Gastroesophageal reflux disease with esophagitis without hemorrhage    History of TIA (transient ischemic attack) 04/14/2021   Hyperlipidemia    Hypertension 01/30/2015   Hypertensive emergency 07/14/2018   Prolonged QT interval 07/14/2018   RBBB    Statin intolerance 08/27/2022   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, 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] 09/24/2022   Thyroid cyst    TIA (transient ischemic attack) 07/14/2018   Type 2 diabetes mellitus (HCC)    Type 2 diabetes mellitus with hyperlipidemia (HCC) 01/30/2015   Vitamin D deficiency     Past Surgical History:  Procedure Laterality Date   COLONOSCOPY WITH PROPOFOL N/A 04/15/2021   Procedure: COLONOSCOPY WITH PROPOFOL;  Surgeon: Darrell Fredrickson, MD;  Location: [facility];  Service: Gastroenterology;  Laterality: N/A;   ESOPHAGOGASTRODUODENOSCOPY (EGD) WITH PROPOFOL N/A 04/15/2021   Procedure: ESOPHAGOGASTRODUODENOSCOPY (EGD) WITH PROPOFOL;  Surgeon: Darrell Fredrickson, MD;  Location: [facility];  Service: Gastroenterology;  Laterality: N/A;   MELANOMA EXCISION     Back   POLYPECTOMY  04/15/2021   Procedure: POLYPECTOMY;  Surgeon: Darrell Fredrickson, MD;  Location: [facility];  Service: Gastroenterology;;   [name]      Current Medications: No outpatient medications have been marked as taking for the 02/17/23 encounter (Appointment) with Darrell Si, MD.     Allergies:   Statins   Social History   Socioeconomic History   Marital status: Single  Spouse name: Not on file   Number of children: 4   Years of education: Not on file   Highest education level: Not on file  Occupational History    Employer: [employer]  Tobacco Use   Smoking status: Never    Passive exposure: Never   Smokeless tobacco: Never  Vaping Use   Vaping status: Never Used  Substance and Sexual Activity    Alcohol use: Yes    Comment: occasional   Drug use: No   Sexual activity: Not on file  Other Topics Concern   Not on file  Social History Narrative   Lives home alone   Social Drivers of Health   Financial Resource Strain: Not on file  Food Insecurity: No Food Insecurity (10/12/2022)   Hunger Vital Sign    Worried About Running Out of Food in the Last Year: Never true    Ran Out of Food in the Last Year: Never true  Transportation Needs: No Transportation Needs (10/12/2022)   PRAPARE - Administrator, Civil Service (Medical): No    Lack of Transportation (Non-Medical): No  Physical Activity: Inactive (10/12/2022)   Exercise Vital Sign    Days of Exercise per Week: 0 days    Minutes of Exercise per Session: 0 min  Stress: Not on file  Social Connections: Not on file     Family History: The patient's family history includes Alzheimer's disease in his brother and father; Diabetes in his father and maternal grandmother; Heart disease in his brother; Hypertension in his father and mother; Pulmonary embolism in his mother. There is no history of Colon cancer.  ROS:   Please see the history of present illness.    (+) Snoring All other systems reviewed and are negative.  EKGs/Labs/Other Studies Reviewed:    Echo  07/15/2018: Sonographer Comments: Respiratory motion.  IMPRESSIONS   1. The left ventricle has normal systolic function with an ejection  fraction of 60-65%. The cavity size was normal. There is severely  increased left ventricular wall thickness. Left ventricular diastolic  Doppler parameters are consistent with impaired  relaxation. Elevated mean left atrial pressure.   2. The right ventricle has normal systolic function. The cavity was  normal. There is no increase in right ventricular wall thickness.   3. No evidence of mitral valve stenosis.   4. The aortic valve is tricuspid. No stenosis of the aortic valve.   5. The aortic root is normal in size and  structure.   6. Pulmonary hypertension is indeterminant, inadequate TR jet.   7. The interatrial septum was not well visualized.   EKG:  EKG is personally reviewed. 10/12/2022: Sinus rhythm. Rate 75 bpm.  RBBB.  LAD.  Lateral TWI  Recent Labs: 04/07/2022: Hemoglobin 15.0; Platelets 200; TSH 1.310 08/27/2022: ALT 10 12/28/2022: BUN 12; Creatinine, Ser 0.86; Potassium 3.6; Sodium 147   Recent Lipid Panel    Component Value Date/Time   CHOL 298 (H) 04/07/2022 1701   TRIG 308 (H) 04/07/2022 1701   HDL 43 04/07/2022 1701   CHOLHDL 6.9 (H) 04/07/2022 1701   CHOLHDL 2.5 07/15/2018 1115   VLDL 12 07/15/2018 1115   LDLCALC 194 (H) 04/07/2022 1701    Physical Exam:    VS:  There were no vitals taken for this visit. , BMI There is no height or weight on file to calculate BMI. GENERAL:  Well appearing HEENT: Pupils equal round and reactive, fundi not visualized, oral mucosa unremarkable NECK:  No jugular venous  distention, waveform within normal limits, carotid upstroke brisk and symmetric, no bruits, no thyromegaly LUNGS:  Clear to auscultation bilaterally HEART:  RRR.  PMI not displaced or sustained, S1 and S2 within normal limits, no S3, no S4, no clicks, no rubs, no murmurs ABD:  Flat, positive bowel sounds normal in frequency in pitch, no bruits, no rebound, no guarding, no midline pulsatile mass, no hepatomegaly, no splenomegaly EXT:  2 plus pulses throughout, no edema, no cyanosis, no clubbing SKIN:  No rashes, no nodules NEURO:  Cranial nerves II through XII grossly intact, motor grossly intact throughout PSYCH:  Cognitively intact, oriented to person place and time   ASSESSMENT/PLAN:          Screening for Secondary Hypertension:     10/12/2022    2:27 PM  Causes  Drugs/Herbals Screened     - Comments tries to limit salt, minimal caffeine, occasional EtOH.  Renovascular HTN Screened     - Comments Check renal artery Dopplers.  Sleep Apnea Screened     - Comments snores.   Daytime somnolence.  Thyroid Disease Screened  Hyperaldosteronism Screened     - Comments check renin and aldosterone  Pheochromocytoma N/A  Cushing's Syndrome N/A  Hyperparathyroidism N/A  Coarctation of the Aorta Screened     - Comments BP symmetric  Compliance Screened     - Comments Meds not filled recnelty    Relevant Labs/Studies:    Latest Ref Rng & Units 12/28/2022    3:29 PM 08/27/2022    2:30 PM 05/07/2022   11:55 AM  Basic Labs  Sodium 134 - 144 mmol/L 147  142  143   Potassium 3.5 - 5.2 mmol/L 3.6  4.0  3.4   Creatinine 0.76 - 1.27 mg/dL 0.25  4.27  0.62        Latest Ref Rng & Units 04/07/2022    5:01 PM 07/14/2018    3:07 PM  Thyroid   TSH 0.450 - 4.500 uIU/mL 1.310  0.698        Latest Ref Rng & Units 10/12/2022    3:36 PM  Renin/Aldosterone   Aldosterone 0.0 - 30.0 ng/dL 37.6   Aldos/Renin Ratio 0.0 - 30.0 62.0              10/12/2022    2:51 PM  Renovascular   Renal Artery [place] Completed Yes     Disposition:    FU with APP/PharmD in 1 month for the next 3 months.   FU with Nelton Amsden C. Duke Salvia, MD, [facility] in 4 months.  Medication Adjustments/Labs and Tests Ordered: Current medicines are reviewed at length with the patient today.  Concerns regarding medicines are outlined above.   No orders of the defined types were placed in this encounter.  No orders of the defined types were placed in this encounter.   Signed, Darrell Si, MD  02/17/2023 1:06 PM    [facility]

## 2023-02-17 NOTE — Progress Notes (Signed)
Outpatient Surgical Follow Up  02/17/2023  Darrell [name] is an 68 y.o. male.   Chief Complaint  Patient presents with   Routine Post Op    HPI: Patient returns s/p left shoulder lipoma revmoval. He reports doing well. His owund is healing without complication. His pain is controlled. He denies any redness, drainage or opening of his incision. He has restarted his plavix.   Past Medical History:  Diagnosis Date   Acute blood loss anemia 04/14/2021   Benign neoplasm of sigmoid colon    Benign prostatic hyperplasia without lower urinary tract symptoms 06/08/2017   Diverticulosis of colon with hemorrhage    Dyslipidemia    Esophageal stricture    Essential hypertension, benign    Expressive aphasia 07/14/2018   Gastroesophageal reflux disease with esophagitis without hemorrhage    History of TIA (transient ischemic attack) 04/14/2021   Hyperlipidemia    Hypertension 01/30/2015   Hypertensive emergency 07/14/2018   Prolonged QT interval 07/14/2018   RBBB    Statin intolerance 08/27/2022   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, 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] 09/24/2022   Thyroid cyst    TIA (transient ischemic attack) 07/14/2018   Type 2 diabetes mellitus (HCC)    Type 2 diabetes mellitus with hyperlipidemia (HCC) 01/30/2015   Vitamin D deficiency     Past Surgical History:  Procedure Laterality Date   COLONOSCOPY WITH PROPOFOL N/A 04/15/2021   Procedure: COLONOSCOPY WITH PROPOFOL;  Surgeon: Hilarie Fredrickson, MD;  Location: [facility];  Service: Gastroenterology;  Laterality: N/A;   ESOPHAGOGASTRODUODENOSCOPY (EGD) WITH PROPOFOL N/A 04/15/2021   Procedure: ESOPHAGOGASTRODUODENOSCOPY (EGD) WITH PROPOFOL;  Surgeon: Hilarie Fredrickson, MD;  Location: [facility];  Service: Gastroenterology;  Laterality: N/A;   MELANOMA EXCISION     Back   POLYPECTOMY  04/15/2021   Procedure: POLYPECTOMY;  Surgeon: Hilarie Fredrickson, MD;  Location: [facility];  Service: Gastroenterology;;   WISDOM TOOTH EXTRACTION      Family History   Problem Relation Age of Onset   Hypertension Mother    Pulmonary embolism Mother    Diabetes Father    Hypertension Father    Alzheimer's disease Father    Heart disease Brother    Alzheimer's disease Brother    Diabetes Maternal Grandmother    Colon cancer Neg Hx     Social History:  reports that he has never smoked. He has never been exposed to tobacco smoke. He has never used smokeless tobacco. He reports current alcohol use. He reports that he does not use drugs.  Allergies:  Allergies  Allergen Reactions   Statins Other (See Comments)    Joint Pain    Medications reviewed.    ROS Full ROS performed and is otherwise negative other than what is stated in HPI   BP (!) 158/80   Pulse 98   Temp 98.2 F (36.8 C)   Ht 5\' 5"  (1.651 m)   Wt 140 lb (63.5 kg)   SpO2 97%   BMI 23.30 kg/m   Physical Exam Left shoulder wound with glue on it, healing well without any erythema, no evidence of recurrence of lipoma  Pathology consistent with intramuscular lipoma, discussed with patient    No results found for this or any previous visit (from the past 48 hours). No results found.  Assessment/Plan:  S/p resection of left shoulder lipoma, doing well. Can follow up in our office PRN    Baker Pierini, M.D. [facility]

## 2023-02-18 ENCOUNTER — Other Ambulatory Visit: Payer: Self-pay

## 2023-02-21 ENCOUNTER — Other Ambulatory Visit (HOSPITAL_COMMUNITY): Payer: Self-pay

## 2023-02-22 ENCOUNTER — Encounter (HOSPITAL_BASED_OUTPATIENT_CLINIC_OR_DEPARTMENT_OTHER): Payer: Medicare Other

## 2023-03-03 ENCOUNTER — Other Ambulatory Visit: Payer: Self-pay | Admitting: Pharmacist

## 2023-03-03 ENCOUNTER — Other Ambulatory Visit: Payer: Self-pay

## 2023-03-03 ENCOUNTER — Telehealth: Payer: Self-pay | Admitting: Pharmacist

## 2023-03-03 NOTE — Progress Notes (Signed)
Attempted to outreach for scheduled phone appointment. I was unable to leave a voicemail on any listed numbers. I did leave a voicemail with his emergency contact.   Touched base with [facility] team. They were attempting to fill his Repatha but cost was $300. Provided with [employer] information.  BIN [id number]; PCN [id number]; Group [id number], ID [id number]  Copay now at $0, patient has $2151.00 remaining on the grant.   Will collaborate with team to attempt outreach to patient

## 2023-03-03 NOTE — Telephone Encounter (Signed)
Called every number on file for patient - none are working. Left a voicemail on the number listed for his emergency contact. Asked them to also pass the message along to call [facility] per the MyChart message sent on 1/17.   Will collaborate with team to attempt call next week.   Catie Eppie Gibson, PharmD, BCACP, CPP Clinical Pharmacist [facility] [phone]

## 2023-03-04 ENCOUNTER — Other Ambulatory Visit: Payer: Self-pay

## 2023-03-04 ENCOUNTER — Other Ambulatory Visit: Payer: Self-pay | Admitting: Pharmacist

## 2023-03-04 ENCOUNTER — Other Ambulatory Visit (HOSPITAL_COMMUNITY): Payer: Self-pay

## 2023-03-04 DIAGNOSIS — E785 Hyperlipidemia, unspecified: Secondary | ICD-10-CM

## 2023-03-04 MED ORDER — FREESTYLE LIBRE 3 READER DEVI
0 refills | Status: AC
  Filled 2023-03-04: qty 1, 30d supply, fill #0

## 2023-03-04 MED ORDER — FREESTYLE LIBRE 3 PLUS SENSOR MISC
3 refills | Status: DC
  Filled 2023-03-04: qty 6, 90d supply, fill #0
  Filled 2023-05-30: qty 6, 90d supply, fill #1
  Filled 2023-08-29: qty 6, 90d supply, fill #2
  Filled 2023-11-26: qty 6, 90d supply, fill #3

## 2023-03-04 NOTE — Progress Notes (Signed)
Care Coordination Call  Spoke with patient. He notes that he got a glucometer, but would like to see if CGM is covered on his insurance. Will send order for Sabina 3 to [facility] to see if covered.   He also notes that he received Repatha in the mail, injected today.   Discussed Rybelsus. He has not received an order from [facility] [employer] yet, but has a supply from a previous dispense. Will collaborate with CPhT to [employer] to follow up on order status. We submitted a dose change form earlier this month for 120 day supply of 7 mg.   Reviewed medications. Reviewed that he should call the pharmacy when he has ~ a week left.   Catie Eppie Gibson, PharmD, BCACP, CPP Clinical Pharmacist [facility] [phone]

## 2023-03-05 ENCOUNTER — Other Ambulatory Visit (HOSPITAL_COMMUNITY): Payer: Self-pay

## 2023-03-07 ENCOUNTER — Other Ambulatory Visit (HOSPITAL_COMMUNITY): Payer: Self-pay

## 2023-03-07 ENCOUNTER — Other Ambulatory Visit: Payer: Self-pay | Admitting: Nurse Practitioner

## 2023-03-07 ENCOUNTER — Other Ambulatory Visit: Payer: Self-pay

## 2023-03-07 DIAGNOSIS — E785 Hyperlipidemia, unspecified: Secondary | ICD-10-CM

## 2023-03-07 MED ORDER — RYBELSUS 7 MG PO TABS
7.0000 mg | ORAL_TABLET | Freq: Every day | ORAL | 0 refills | Status: DC
  Filled 2023-03-07: qty 30, 30d supply, fill #0

## 2023-03-07 NOTE — Progress Notes (Signed)
 Rx sent

## 2023-03-08 ENCOUNTER — Other Ambulatory Visit: Payer: Self-pay

## 2023-03-12 ENCOUNTER — Other Ambulatory Visit (HOSPITAL_COMMUNITY): Payer: Self-pay

## 2023-03-17 ENCOUNTER — Other Ambulatory Visit (HOSPITAL_COMMUNITY): Payer: Self-pay

## 2023-03-17 ENCOUNTER — Ambulatory Visit (HOSPITAL_COMMUNITY): Payer: Medicare Other

## 2023-03-29 ENCOUNTER — Ambulatory Visit (HOSPITAL_COMMUNITY)
Admission: RE | Admit: 2023-03-29 | Payer: Medicare Other | Source: Ambulatory Visit | Attending: Cardiovascular Disease | Admitting: Cardiovascular Disease

## 2023-04-01 ENCOUNTER — Encounter: Payer: Self-pay | Admitting: Nurse Practitioner

## 2023-04-01 ENCOUNTER — Other Ambulatory Visit (HOSPITAL_BASED_OUTPATIENT_CLINIC_OR_DEPARTMENT_OTHER): Payer: Self-pay

## 2023-04-01 ENCOUNTER — Ambulatory Visit (INDEPENDENT_AMBULATORY_CARE_PROVIDER_SITE_OTHER): Payer: Medicare Other | Admitting: Nurse Practitioner

## 2023-04-01 ENCOUNTER — Other Ambulatory Visit (HOSPITAL_COMMUNITY): Payer: Self-pay

## 2023-04-01 ENCOUNTER — Other Ambulatory Visit: Payer: Self-pay

## 2023-04-01 VITALS — BP 122/67 | HR 73 | Wt 144.4 lb

## 2023-04-01 DIAGNOSIS — E1169 Type 2 diabetes mellitus with other specified complication: Secondary | ICD-10-CM

## 2023-04-01 DIAGNOSIS — I10 Essential (primary) hypertension: Secondary | ICD-10-CM

## 2023-04-01 DIAGNOSIS — Z8673 Personal history of transient ischemic attack (TIA), and cerebral infarction without residual deficits: Secondary | ICD-10-CM

## 2023-04-01 DIAGNOSIS — E785 Hyperlipidemia, unspecified: Secondary | ICD-10-CM

## 2023-04-01 DIAGNOSIS — E782 Mixed hyperlipidemia: Secondary | ICD-10-CM

## 2023-04-01 LAB — POCT GLYCOSYLATED HEMOGLOBIN (HGB A1C): Hemoglobin A1C: 9.2 % — AB (ref 4.0–5.6)

## 2023-04-01 MED ORDER — RYBELSUS 14 MG PO TABS
14.0000 mg | ORAL_TABLET | Freq: Every day | ORAL | 1 refills | Status: DC
  Filled 2023-04-01: qty 30, 30d supply, fill #0
  Filled 2023-04-06: qty 90, 90d supply, fill #0

## 2023-04-01 MED ORDER — NIFEDIPINE ER OSMOTIC RELEASE 60 MG PO TB24
60.0000 mg | ORAL_TABLET | Freq: Two times a day (BID) | ORAL | 1 refills | Status: DC
  Filled 2023-04-01: qty 180, 90d supply, fill #0

## 2023-04-01 MED ORDER — REPATHA SURECLICK 140 MG/ML ~~LOC~~ SOAJ
140.0000 mg | SUBCUTANEOUS | 2 refills | Status: DC
  Filled 2023-04-01: qty 2, 28d supply, fill #0
  Filled 2023-06-30: qty 2, 28d supply, fill #1

## 2023-04-01 NOTE — Assessment & Plan Note (Signed)
Continue Plavix 75 mg daily  

## 2023-04-01 NOTE — Assessment & Plan Note (Addendum)
 Lab Results  Component Value Date   CHOL 298 (H) 04/07/2022   HDL 43 04/07/2022   LDLCALC 194 (H) 04/07/2022   TRIG 308 (H) 04/07/2022   CHOLHDL 6.9 (H) 04/07/2022  Repatha 140 mg every 14 days injection refilled, I notified the clinical pharmacist Patient is enrolled with [employer]

## 2023-04-01 NOTE — Patient Instructions (Signed)
 Goal for fasting blood sugar ranges from 80 to 120 and 2 hours after any meal or at bedtime should be between 130 to 170.     1. Mixed hyperlipidemia (Primary)   2. Type 2 diabetes mellitus with hyperlipidemia (HCC)  - POCT glycosylated hemoglobin (Hb A1C) - Basic Metabolic Panel - Lipid panel - Microalbumin / creatinine urine ratio - Semaglutide (RYBELSUS) 14 MG TABS; Take 1 tablet (14 mg total) by mouth daily.  Dispense: 90 tablet; Refill: 1   It is important that you exercise regularly at least 30 minutes 5 times a week as tolerated  Think about what you will eat, plan ahead. Choose " clean, green, fresh or frozen" over canned, processed or packaged foods which are more sugary, salty and fatty. 70 to 75% of food eaten should be vegetables and fruit. Three meals at set times with snacks allowed between meals, but they must be fruit or vegetables. Aim to eat over a 12 hour period , example 7 am to 7 pm, and STOP after  your last meal of the day. Drink water,generally about 64 ounces per day, no other drink is as healthy. Fruit juice is best enjoyed in a healthy way, by EATING the fruit.  Thanks for choosing [facility] we consider it a privelige to serve you.

## 2023-04-01 NOTE — Assessment & Plan Note (Signed)
 BP Readings from Last 3 Encounters:  04/01/23 122/67  02/17/23 (!) 158/80  02/08/23 (!) 194/106  Currently well-controlled on nifedipine 60 mg twice daily, olmesartan-hydrochlorothiazide 40-25 mg 1 tablet daily Continue current medications. No changes in management. Discussed DASH diet and dietary sodium restrictions Continue to increase dietary efforts and exercise.  Appreciate collaboration with cardiology

## 2023-04-01 NOTE — Progress Notes (Signed)
 Established Patient Office Visit  Subjective:  Patient ID: Darrell [name], male    DOB: 09-11-1955  Age: 68 y.o. MRN: [id number]  CC:  Chief Complaint  Patient presents with   Diabetes   Hypertension    HPI Darrell [name] is a 68 y.o. male  has a past medical history of Acute blood loss anemia (04/14/2021), Benign neoplasm of sigmoid colon, Benign prostatic hyperplasia without lower urinary tract symptoms (06/08/2017), Diverticulosis of colon with hemorrhage, Dyslipidemia, Esophageal stricture, Essential hypertension, benign, Expressive aphasia (07/14/2018), Gastroesophageal reflux disease with esophagitis without hemorrhage, History of TIA (transient ischemic attack) (04/14/2021), Hyperlipidemia, Hypertension (01/30/2015), Hypertensive emergency (07/14/2018), Prolonged QT interval (07/14/2018), RBBB, Statin intolerance (08/27/2022), 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   Patient presents for follow-up for his chronic medical conditions Patient Denies any adverse reactions to current medications  Hypertension.  Currently on nifedipine 60 mg daily, olmesartan-hydrochlorothiazide 40-25 mg daily.  Patient denies chest pain shortness of breath edema  Uncontrolled type 2 diabetes.  Currently on metformin 1000 mg twice daily Rybelsus 7 mg daily.  Takes Repatha 140 mg every 2 weeks for hyperlipidemia.  States that his diet can be better. patient stated that he has been out of red bases and recreated.  Denies hypoglycemia, polyphagia polyuria polydipsia.  States that he is up-to-date with diabetic eye exam  Patient encouraged to get Tdap vaccine and shingles vaccine and pneumococcal vaccine at the pharmacy.      Past Medical History:  Diagnosis Date   Acute blood loss anemia 04/14/2021   Benign neoplasm  of sigmoid colon    Benign prostatic hyperplasia without lower urinary tract symptoms 06/08/2017   Diverticulosis of colon with hemorrhage    Dyslipidemia    Esophageal stricture    Essential hypertension, benign    Expressive aphasia 07/14/2018   Gastroesophageal reflux disease with esophagitis without hemorrhage    History of TIA (transient ischemic attack) 04/14/2021   Hyperlipidemia    Hypertension 01/30/2015   Hypertensive emergency 07/14/2018   Prolonged QT interval 07/14/2018   RBBB    Statin intolerance 08/27/2022   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, 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] 09/24/2022   Thyroid cyst    TIA (transient ischemic attack) 07/14/2018   Type 2 diabetes mellitus (HCC)    Type 2 diabetes mellitus with hyperlipidemia (HCC) 01/30/2015   Vitamin D deficiency     Past Surgical History:  Procedure Laterality Date   COLONOSCOPY WITH PROPOFOL N/A 04/15/2021   Procedure: COLONOSCOPY WITH PROPOFOL;  Surgeon: Hilarie Fredrickson, MD;  Location: [facility];  Service: Gastroenterology;  Laterality: N/A;   ESOPHAGOGASTRODUODENOSCOPY (EGD) WITH PROPOFOL N/A 04/15/2021   Procedure: ESOPHAGOGASTRODUODENOSCOPY (EGD) WITH PROPOFOL;  Surgeon: Hilarie Fredrickson, MD;  Location: [facility];  Service: Gastroenterology;  Laterality: N/A;   MELANOMA EXCISION     Back   POLYPECTOMY  04/15/2021   Procedure: POLYPECTOMY;  Surgeon: Hilarie Fredrickson, MD;  Location: [facility];  Service: Gastroenterology;;   WISDOM TOOTH EXTRACTION      Family History  Problem Relation Age of Onset   Hypertension Mother    Pulmonary embolism Mother    Diabetes Father    Hypertension Father    Alzheimer's disease Father    Heart disease Brother    Alzheimer's disease Brother    Diabetes Maternal Grandmother    Colon cancer Neg Hx     Social  History   Socioeconomic History   Marital status: Single    Spouse name: Not on file   Number of children: 4   Years of education: Not on file   Highest education level: Not on file   Occupational History    Employer: [employer]  Tobacco Use   Smoking status: Never    Passive exposure: Never   Smokeless tobacco: Never  Vaping Use   Vaping status: Never Used  Substance and Sexual Activity   Alcohol use: Yes    Comment: occasional   Drug use: No   Sexual activity: Not on file  Other Topics Concern   Not on file  Social History Narrative   Lives home alone   Social Drivers of Health   Financial Resource Strain: Not on file  Food Insecurity: No Food Insecurity (10/12/2022)   Hunger Vital Sign    Worried About Running Out of Food in the Last Year: Never true    Ran Out of Food in the Last Year: Never true  Transportation Needs: No Transportation Needs (10/12/2022)   PRAPARE - Administrator, Civil Service (Medical): No    Lack of Transportation (Non-Medical): No  Physical Activity: Inactive (10/12/2022)   Exercise Vital Sign    Days of Exercise per Week: 0 days    Minutes of Exercise per Session: 0 min  Stress: Not on file  Social Connections: Not on file  Intimate Partner Violence: Not on file    Outpatient Medications Prior to Visit  Medication Sig Dispense Refill   clopidogrel (PLAVIX) 75 MG tablet Take 1 tablet (75 mg total) by mouth daily. 90 tablet 3   Continuous Glucose Receiver (FREESTYLE LIBRE 3 READER) DEVI Use to check blood sugar continuously 1 each 0   Continuous Glucose Sensor (FREESTYLE LIBRE 3 PLUS SENSOR) MISC Change sensor every 15 days. 6 each 3   metFORMIN (GLUCOPHAGE) 1000 MG tablet Take 1 tablet (1,000 mg total) by mouth 2 (two) times daily with a meal. 180 tablet 1   Multiple Vitamin (MULTIVITAMIN) tablet Take 1 tablet by mouth daily.     olmesartan-hydrochlorothiazide (BENICAR HCT) 40-25 MG tablet Take 1 tablet by mouth daily. 90 tablet 1   Evolocumab (REPATHA SURECLICK) 140 MG/ML SOAJ Inject 140 mg into the skin every 14 (fourteen) days. 6 mL 2   NIFEdipine (PROCARDIA XL/NIFEDICAL XL) 60 MG 24 hr tablet Take 1 tablet (60 mg  total) by mouth in the morning and at bedtime. 180 tablet 1   Semaglutide (RYBELSUS) 7 MG TABS Take 1 tablet (7 mg total) by mouth daily. 30 tablet 0   Blood Glucose Monitoring Suppl (ACCU-CHEK AVIVA PLUS) w/Device KIT Use as directed to check blood sugar (Patient not taking: Reported on 04/01/2023) 1 kit 0   Glucose Blood (BLOOD GLUCOSE TEST STRIPS) STRP Use to check blood sugar twice daily. May substitute to any manufacturer covered by patient's insurance. (Patient not taking: Reported on 04/01/2023) 100 strip 3   Lancet Device MISC Use to check blood sugar twice daily. May substitute to any manufacturer covered by patient's insurance. (Patient not taking: Reported on 04/01/2023) 1 each 0   OneTouch Delica Lancets 30G MISC Use to test blood sugar as directed (Patient not taking: Reported on 04/01/2023) 100 each 3   No facility-administered medications prior to visit.    Allergies  Allergen Reactions   Statins Other (See Comments)    Joint Pain    ROS Review of Systems  Constitutional:  Negative for appetite  change, chills, fatigue and fever.  HENT:  Negative for congestion, postnasal drip, rhinorrhea and sneezing.   Respiratory:  Negative for cough, shortness of breath and wheezing.   Cardiovascular:  Negative for chest pain, palpitations and leg swelling.  Gastrointestinal:  Negative for abdominal pain, constipation, nausea and vomiting.  Genitourinary:  Negative for difficulty urinating, dysuria, flank pain and frequency.  Musculoskeletal:  Negative for arthralgias, back pain, joint swelling and myalgias.  Skin:  Negative for color change, pallor, rash and wound.  Neurological:  Negative for dizziness, facial asymmetry, weakness, numbness and headaches.  Psychiatric/Behavioral:  Negative for behavioral problems, confusion, self-injury and suicidal ideas.       Objective:    Physical Exam Vitals and nursing note reviewed.  Constitutional:      General: He is not in acute  distress.    Appearance: Normal appearance. He is not ill-appearing, toxic-appearing or diaphoretic.  HENT:     Mouth/Throat:     Mouth: Mucous membranes are moist.     Pharynx: Oropharynx is clear. No oropharyngeal exudate or posterior oropharyngeal erythema.  Eyes:     General: No scleral icterus.       Right eye: No discharge.        Left eye: No discharge.     Extraocular Movements: Extraocular movements intact.     Conjunctiva/sclera: Conjunctivae normal.  Cardiovascular:     Rate and Rhythm: Normal rate and regular rhythm.     Pulses: Normal pulses.     Heart sounds: Normal heart sounds. No murmur heard.    No friction rub. No gallop.  Pulmonary:     Effort: Pulmonary effort is normal. No respiratory distress.     Breath sounds: Normal breath sounds. No stridor. No wheezing, rhonchi or rales.  Chest:     Chest wall: No tenderness.  Abdominal:     General: There is no distension.     Palpations: Abdomen is soft.     Tenderness: There is no abdominal tenderness. There is no right CVA tenderness, left CVA tenderness or guarding.  Musculoskeletal:        General: No swelling, tenderness, deformity or signs of injury.     Right lower leg: No edema.     Left lower leg: No edema.  Skin:    General: Skin is warm and dry.     Capillary Refill: Capillary refill takes less than 2 seconds.     Coloration: Skin is not jaundiced or pale.     Findings: No bruising, erythema or lesion.  Neurological:     Mental Status: He is alert and oriented to person, place, and time.     Motor: No weakness.     Coordination: Coordination normal.     Gait: Gait normal.  Psychiatric:        Mood and Affect: Mood normal.        Behavior: Behavior normal.        Thought Content: Thought content normal.        Judgment: Judgment normal.     BP 122/67   Pulse 73   Wt 144 lb 6.4 oz (65.5 kg)   SpO2 95%   BMI 24.03 kg/m  Wt Readings from Last 3 Encounters:  04/01/23 144 lb 6.4 oz (65.5 kg)   02/17/23 140 lb (63.5 kg)  02/08/23 142 lb (64.4 kg)    Lab Results  Component Value Date   TSH 1.310 04/07/2022   Lab Results  Component Value Date  WBC 8.0 04/07/2022   HGB 15.0 04/07/2022   HCT 42.8 04/07/2022   MCV 93 04/07/2022   PLT 200 04/07/2022   Lab Results  Component Value Date   NA 147 (H) 12/28/2022   K 3.6 12/28/2022   CO2 26 12/28/2022   GLUCOSE 162 (H) 12/28/2022   BUN 12 12/28/2022   CREATININE 0.86 12/28/2022   BILITOT 0.6 08/27/2022   ALKPHOS 56 08/27/2022   AST 12 08/27/2022   ALT 10 08/27/2022   PROT 6.7 08/27/2022   ALBUMIN 4.5 08/27/2022   CALCIUM 10.1 12/28/2022   ANIONGAP 9 04/15/2021   EGFR 96 12/28/2022   Lab Results  Component Value Date   CHOL 298 (H) 04/07/2022   Lab Results  Component Value Date   HDL 43 04/07/2022   Lab Results  Component Value Date   LDLCALC 194 (H) 04/07/2022   Lab Results  Component Value Date   TRIG 308 (H) 04/07/2022   Lab Results  Component Value Date   CHOLHDL 6.9 (H) 04/07/2022   Lab Results  Component Value Date   HGBA1C 9.2 (A) 04/01/2023      Assessment & Plan:   Problem List Items Addressed This Visit       Cardiovascular and Mediastinum   Hypertension   BP Readings from Last 3 Encounters:  04/01/23 122/67  02/17/23 (!) 158/80  02/08/23 (!) 194/106  Currently well-controlled on nifedipine 60 mg twice daily, olmesartan-hydrochlorothiazide 40-25 mg 1 tablet daily Continue current medications. No changes in management. Discussed DASH diet and dietary sodium restrictions Continue to increase dietary efforts and exercise.  Appreciate collaboration with cardiology        Relevant Medications   Evolocumab (REPATHA SURECLICK) 140 MG/ML SOAJ   NIFEdipine (PROCARDIA XL/NIFEDICAL XL) 60 MG 24 hr tablet     Endocrine   Type 2 diabetes mellitus with hyperlipidemia (HCC) - Primary   Lab Results  Component Value Date   HGBA1C 7.7 (A) 12/28/2022  A1c 9.2 today Patient counseled on  low-carb diet Increase Rybelsus to 14 mg daily, continue metformin 1000 mg twice daily Getting rybelsus from [employer] the clinical pharmacist has been notified about the change and to assist the patient with getting the med  Check lipid panel and urine microalbumin labs Follow-up in 3 months      Relevant Medications   Semaglutide (RYBELSUS) 14 MG TABS   Evolocumab (REPATHA SURECLICK) 140 MG/ML SOAJ   NIFEdipine (PROCARDIA XL/NIFEDICAL XL) 60 MG 24 hr tablet   Other Relevant Orders   POCT glycosylated hemoglobin (Hb A1C) (Completed)   Basic Metabolic Panel   Lipid panel   Microalbumin / creatinine urine ratio     Other   Hyperlipidemia   Lab Results  Component Value Date   CHOL 298 (H) 04/07/2022   HDL 43 04/07/2022   LDLCALC 194 (H) 04/07/2022   TRIG 308 (H) 04/07/2022   CHOLHDL 6.9 (H) 04/07/2022  Repatha 140 mg every 14 days injection refilled, I notified the clinical pharmacist Patient is enrolled with [facility]        Relevant Medications   Evolocumab (REPATHA SURECLICK) 140 MG/ML SOAJ   NIFEdipine (PROCARDIA XL/NIFEDICAL XL) 60 MG 24 hr tablet   History of TIA (transient ischemic attack)   Continue Plavix 75 mg daily       Meds ordered this encounter  Medications   Semaglutide (RYBELSUS) 14 MG TABS    Sig: Take 1 tablet (14 mg total) by mouth daily.    Dispense:  90 tablet    Refill:  1   Evolocumab (REPATHA SURECLICK) 140 MG/ML SOAJ    Sig: Inject 140 mg into the skin every 14 (fourteen) days.    Dispense:  6 mL    Refill:  2   NIFEdipine (PROCARDIA XL/NIFEDICAL XL) 60 MG 24 hr tablet    Sig: Take 1 tablet (60 mg total) by mouth in the morning and at bedtime.    Dispense:  180 tablet    Refill:  1    Follow-up: Return in about 3 months (around 06/29/2023) for CPE, DM.    Donell Beers, FNP

## 2023-04-01 NOTE — Assessment & Plan Note (Signed)
 Lab Results  Component Value Date   HGBA1C 7.7 (A) 12/28/2022  A1c 9.2 today Patient counseled on low-carb diet Increase Rybelsus to 14 mg daily, continue metformin 1000 mg twice daily Getting rybelsus from [employer] the clinical pharmacist has been notified about the change and to assist the patient with getting the med  Check lipid panel and urine microalbumin labs Follow-up in 3 months

## 2023-04-02 LAB — BASIC METABOLIC PANEL
BUN/Creatinine Ratio: 16 (ref 10–24)
BUN: 13 mg/dL (ref 8–27)
CO2: 26 mmol/L (ref 20–29)
Calcium: 9.6 mg/dL (ref 8.6–10.2)
Chloride: 103 mmol/L (ref 96–106)
Creatinine, Ser: 0.82 mg/dL (ref 0.76–1.27)
Glucose: 270 mg/dL — ABNORMAL HIGH (ref 70–99)
Potassium: 3.8 mmol/L (ref 3.5–5.2)
Sodium: 144 mmol/L (ref 134–144)
eGFR: 97 mL/min/{1.73_m2} (ref >59)

## 2023-04-02 LAB — LIPID PANEL
Chol/HDL Ratio: 2 ratio (ref 0.0–5.0)
Cholesterol, Total: 110 mg/dL (ref 100–199)
HDL: 55 mg/dL (ref >39)
LDL Chol Calc (NIH): 38 mg/dL (ref 0–99)
Triglycerides: 88 mg/dL (ref 0–149)
VLDL Cholesterol Cal: 17 mg/dL (ref 5–40)

## 2023-04-03 LAB — MICROALBUMIN / CREATININE URINE RATIO
Creatinine, Urine: 126.1 mg/dL
Microalb/Creat Ratio: 23 mg/g{creat} (ref 0–29)
Microalbumin, Urine: 29.6 ug/mL

## 2023-04-04 ENCOUNTER — Other Ambulatory Visit: Payer: Self-pay

## 2023-04-04 ENCOUNTER — Other Ambulatory Visit (HOSPITAL_COMMUNITY): Payer: Self-pay

## 2023-04-04 ENCOUNTER — Telehealth: Payer: Self-pay

## 2023-04-04 DIAGNOSIS — Z5986 Financial insecurity: Secondary | ICD-10-CM

## 2023-04-04 NOTE — Progress Notes (Signed)
   04/04/2023  Patient ID: Darrell [name], male   DOB: December 28, 1955, 68 y.o.   MRN: [id number]  Attempted to contact patient for medication management/review. Left HIPAA compliant message for patient to return my call at their convenience.   First attempt for patient outreach. Will follow up with patient in 2-3 business days.   I called [facility] today with the following information:  Rybelsus needs PA which held up getting Repatha filled. Per chart review, patient was approved for medication assistance for Rybelsus until the end of this year. Will outreach advocate team regarding distribution of medication.  Repatha has $0 Copay and was out of stock  earlier this month. I asked to get the medication refilled.  Since it was tied to the Rybelsus order, this prescription was held d/t an issue with Rybelsus as per system design. Repatha should be shipped out tomorrow and received by Wednesday.   Thank you for allowing pharmacy to be a part of this patient's care.  Cephus Shelling, PharmD Clinical Pharmacist Cell: [phone]

## 2023-04-05 ENCOUNTER — Telehealth: Payer: Self-pay

## 2023-04-05 ENCOUNTER — Other Ambulatory Visit (HOSPITAL_COMMUNITY): Payer: Self-pay

## 2023-04-05 ENCOUNTER — Other Ambulatory Visit: Payer: Self-pay

## 2023-04-05 NOTE — Telephone Encounter (Signed)
 Called to advise medication was mailed to [place] . It is ready for pick up. No answer sent my chart message to advise ready for pick up. KH

## 2023-04-06 ENCOUNTER — Other Ambulatory Visit: Payer: Self-pay

## 2023-04-06 ENCOUNTER — Other Ambulatory Visit (HOSPITAL_COMMUNITY): Payer: Self-pay

## 2023-04-07 ENCOUNTER — Other Ambulatory Visit: Payer: Self-pay

## 2023-04-08 ENCOUNTER — Ambulatory Visit (HOSPITAL_BASED_OUTPATIENT_CLINIC_OR_DEPARTMENT_OTHER): Payer: Medicare Other | Admitting: Cardiovascular Disease

## 2023-04-08 ENCOUNTER — Encounter (HOSPITAL_BASED_OUTPATIENT_CLINIC_OR_DEPARTMENT_OTHER): Payer: Self-pay | Admitting: Cardiovascular Disease

## 2023-04-08 ENCOUNTER — Ambulatory Visit (HOSPITAL_COMMUNITY)
Admission: RE | Admit: 2023-04-08 | Discharge: 2023-04-08 | Disposition: A | Discharge status: Home / Self Care | Payer: Medicare Other | Source: Ambulatory Visit | Attending: Cardiovascular Disease | Admitting: Cardiovascular Disease

## 2023-04-08 VITALS — BP 130/74 | HR 80 | Ht 65.0 in | Wt 139.8 lb

## 2023-04-08 DIAGNOSIS — I10 Essential (primary) hypertension: Secondary | ICD-10-CM | POA: Diagnosis present

## 2023-04-08 DIAGNOSIS — G459 Transient cerebral ischemic attack, unspecified: Secondary | ICD-10-CM | POA: Diagnosis not present

## 2023-04-08 NOTE — Progress Notes (Signed)
 [facility] Initial Assessment:    Date:  04/10/2023   ID:  Darrell [name], DOB 01/04/1956, MRN [id number]  PCP:  Donell Beers, FNP  Cardiologist:  None   Referring MD: Donell Beers, FNP   CC: Hypertension  History of Present Illness:    Darrell [name] is a 68 y.o. male with a hx of TIA, hypertension, hyperlipidemia, diabetes, vitamin D deficiency, thyroid cyst, here f or follow up.  He first established care in the [facility] 10/2022. He was seen by his PCP 08/27/2022 and his blood pressure was elevated to 171/103. He had run out of his medications for several weeks (nifedipine 60 mg daily, olmesartan-HCTZ 40-25 mg daily). He was given a one-time dose of clonidine 0.2 mg and his other medications were refilled. Noted to have a history of TIA and had been on Plavix 75 mg. At his follow-up with his PCP 09/2022 his blood pressure was 141/85. He was not taking medications consistently due to being unable to afford them. His medications were sent to the [facility]. Previously had an echocardiogram 07/2018 showing LVEF 60-65%, severely increased left ventricular wall thickness, and diastolic parameters consistent with impaired relaxation. Mean left atrial pressure was elevated. No valvular abnormalities.  Today, he states he has had high blood pressure since he was in his 40's. He notes that it has been difficult to control since he stopped exercising routinely in 2021. Over a year, he lost weight from about 189 lbs to 146 lbs, and then fell out of the habit of exercising. He is planning on returning to the gym soon for exercise. In the office his blood pressure is elevated to 168/104 initially, and 184/94 on manual recheck. His home readings are mostly closer to 150s/75-88. He will take his medications first thing in the morning. He is monitoring sodium intake at home. If he does use salt, he will use Himalayan pink salt or sea salt. The only  caffeinated beverages he consumes are [employer], one a day at most. Alcohol consumption is occasional. He does not use pain medication. Current supplements include One-A-Day gummies and CoQ10. He does snore, but feels rested in the mornings. If he feels bored, he may fall asleep easily during the day. He confirms that in 2020, he suffered a mild stroke. At the time he woke up with blurry vision and some aphasia. He had started taking 81 mg ASA a few weeks prior to his stroke as he began experiencing some chest pain and left arm pain. Today he denies any issues with chest pain or arm pain. He denies any palpitations, shortness of breath, peripheral edema, lightheadedness, headaches, syncope, orthopnea, or PND.  At the initial  visit he was asked to add nifedipine as previously ordered.  He was also enrolled in the RPM study and asked to increase exercise.  Nifedipine was increased to bid.    [name] has experienced unintentional weight loss, losing approximately 6-7 pounds recently. He notes a decreased appetite and attempts to force himself to eat, but finds it ineffective. He attributes some of the appetite loss to his current medication, Rybelsus, which is used for diabetes management. He also mentions using gummies to help with his appetite, but they primarily make him sleepy rather than hungry.  He is currently taking Rybelsus for diabetes, nifedipine twice a day, and an olmesartan hydrochlorothiazide combination pill once a day for blood pressure management. He also takes Plavix to help prevent  stroke and Repatha for cholesterol management. His cholesterol levels have significantly improved, with his total cholesterol now at 110, triglycerides at 88, and LDL at 38.  His blood pressure readings have improved, with a recent home reading of 137/90 and an office reading of 134/75. He recalls a recent reading of 130/72 at another location, indicating better control than previous levels.  He  exercises regularly, going to the gym and walking his dog, although he sometimes finds the gym boring. He feels good when exercising but is eager to leave the gym once he arrives. He describes his walks with his dog as a mix of leisurely and brisk, depending on his dog's pace.     Previous antihypertensives: N/a  Past Medical History:  Diagnosis Date   Acute blood loss anemia 04/14/2021   Benign neoplasm of sigmoid colon    Benign prostatic hyperplasia without lower urinary tract symptoms 06/08/2017   Diverticulosis of colon with hemorrhage    Dyslipidemia    Esophageal stricture    Essential hypertension, benign    Expressive aphasia 07/14/2018   Gastroesophageal reflux disease with esophagitis without hemorrhage    History of TIA (transient ischemic attack) 04/14/2021   Hyperlipidemia    Hypertension 01/30/2015   Hypertensive emergency 07/14/2018   Prolonged QT interval 07/14/2018   RBBB    Statin intolerance 08/27/2022   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, 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] 09/24/2022   Thyroid cyst    TIA (transient ischemic attack) 07/14/2018   Type 2 diabetes mellitus (HCC)    Type 2 diabetes mellitus with hyperlipidemia (HCC) 01/30/2015   Vitamin D deficiency     Past Surgical History:  Procedure Laterality Date   COLONOSCOPY WITH PROPOFOL N/A 04/15/2021   Procedure: COLONOSCOPY WITH PROPOFOL;  Surgeon: Hilarie Fredrickson, MD;  Location: [facility];  Service: Gastroenterology;  Laterality: N/A;   ESOPHAGOGASTRODUODENOSCOPY (EGD) WITH PROPOFOL N/A 04/15/2021   Procedure: ESOPHAGOGASTRODUODENOSCOPY (EGD) WITH PROPOFOL;  Surgeon: Hilarie Fredrickson, MD;  Location: [facility];  Service: Gastroenterology;  Laterality: N/A;   MELANOMA EXCISION     Back   POLYPECTOMY  04/15/2021   Procedure: POLYPECTOMY;  Surgeon: Hilarie Fredrickson, MD;  Location: [facility];  Service: Gastroenterology;;   WISDOM TOOTH EXTRACTION      Current Medications: Current Meds  Medication Sig   Blood Glucose Monitoring Suppl  (ACCU-CHEK AVIVA PLUS) w/Device KIT Use as directed to check blood sugar   clopidogrel (PLAVIX) 75 MG tablet Take 1 tablet (75 mg total) by mouth daily.   Continuous Glucose Receiver (FREESTYLE LIBRE 3 READER) DEVI Use to check blood sugar continuously   Continuous Glucose Sensor (FREESTYLE LIBRE 3 PLUS SENSOR) MISC Change sensor every 15 days.   Evolocumab (REPATHA SURECLICK) 140 MG/ML SOAJ Inject 140 mg into the skin every 14 (fourteen) days.   Glucose Blood (BLOOD GLUCOSE TEST STRIPS) STRP Use to check blood sugar twice daily. May substitute to any manufacturer covered by patient's insurance.   Lancet Device MISC Use to check blood sugar twice daily. May substitute to any manufacturer covered by patient's insurance.   metFORMIN (GLUCOPHAGE) 1000 MG tablet Take 1 tablet (1,000 mg total) by mouth 2 (two) times daily with a meal.   Multiple Vitamin (MULTIVITAMIN) tablet Take 1 tablet by mouth daily.   NIFEdipine (PROCARDIA XL/NIFEDICAL XL) 60 MG 24 hr tablet Take 1 tablet (60 mg total) by mouth in the morning and at bedtime.   olmesartan-hydrochlorothiazide (BENICAR HCT) 40-25 MG tablet Take 1 tablet by mouth daily.   OneTouch Delica  Lancets 30G MISC Use to test blood sugar as directed   Semaglutide (RYBELSUS) 14 MG TABS Take 1 tablet (14 mg total) by mouth daily.     Allergies:   Statins   Social History   Socioeconomic History   Marital status: Single    Spouse name: Not on file   Number of children: 4   Years of education: Not on file   Highest education level: Not on file  Occupational History    Employer: [employer]  Tobacco Use   Smoking status: Never    Passive exposure: Never   Smokeless tobacco: Never  Vaping Use   Vaping status: Never Used  Substance and Sexual Activity   Alcohol use: Yes    Comment: occasional   Drug use: No   Sexual activity: Not on file  Other Topics Concern   Not on file  Social History Narrative   Lives home alone   Social Drivers of Health    Financial Resource Strain: Not on file  Food Insecurity: No Food Insecurity (10/12/2022)   Hunger Vital Sign    Worried About Running Out of Food in the Last Year: Never true    Ran Out of Food in the Last Year: Never true  Transportation Needs: No Transportation Needs (10/12/2022)   PRAPARE - Administrator, Civil Service (Medical): No    Lack of Transportation (Non-Medical): No  Physical Activity: Inactive (10/12/2022)   Exercise Vital Sign    Days of Exercise per Week: 0 days    Minutes of Exercise per Session: 0 min  Stress: Not on file  Social Connections: Not on file     Family History: The patient's family history includes Alzheimer's disease in his brother and father; Diabetes in his father and maternal grandmother; Heart disease in his brother; Hypertension in his father and mother; Pulmonary embolism in his mother. There is no history of Colon cancer.  ROS:   Please see the history of present illness.    (+) Snoring All other systems reviewed and are negative.  EKGs/Labs/Other Studies Reviewed:    Echo  07/15/2018: Sonographer Comments: Respiratory motion.  IMPRESSIONS   1. The left ventricle has normal systolic function with an ejection  fraction of 60-65%. The cavity size was normal. There is severely  increased left ventricular wall thickness. Left ventricular diastolic  Doppler parameters are consistent with impaired  relaxation. Elevated mean left atrial pressure.   2. The right ventricle has normal systolic function. The cavity was  normal. There is no increase in right ventricular wall thickness.   3. No evidence of mitral valve stenosis.   4. The aortic valve is tricuspid. No stenosis of the aortic valve.   5. The aortic root is normal in size and structure.   6. Pulmonary hypertension is indeterminant, inadequate TR jet.   7. The interatrial septum was not well visualized.   EKG:  EKG is personally reviewed. 10/12/2022: Sinus rhythm. Rate 75  bpm.  RBBB.  LAD.  Lateral TWI  Recent Labs: 08/27/2022: ALT 10 04/01/2023: BUN 13; Creatinine, Ser 0.82; Potassium 3.8; Sodium 144   Recent Lipid Panel    Component Value Date/Time   CHOL 110 04/01/2023 1149   TRIG 88 04/01/2023 1149   HDL 55 04/01/2023 1149   CHOLHDL 2.0 04/01/2023 1149   CHOLHDL 2.5 07/15/2018 1115   VLDL 12 07/15/2018 1115   LDLCALC 38 04/01/2023 1149    Physical Exam:    VS:  BP 130/74  Pulse 80   Ht 5\' 5"  (1.651 m)   Wt 139 lb 12.8 oz (63.4 kg)   SpO2 97%   BMI 23.26 kg/m  , BMI Body mass index is 23.26 kg/m. GENERAL:  Well appearing HEENT: Pupils equal round and reactive, fundi not visualized, oral mucosa unremarkable NECK:  No jugular venous distention, waveform within normal limits, carotid upstroke brisk and symmetric, no bruits, no thyromegaly LUNGS:  Clear to auscultation bilaterally HEART:  RRR.  PMI not displaced or sustained, S1 and S2 within normal limits, no S3, no S4, no clicks, no rubs, no murmurs ABD:  Flat, positive bowel sounds normal in frequency in pitch, no bruits, no rebound, no guarding, no midline pulsatile mass, no hepatomegaly, no splenomegaly EXT:  2 plus pulses throughout, no edema, no cyanosis, no clubbing SKIN:  No rashes, no nodules NEURO:  Cranial nerves II through XII grossly intact, motor grossly intact throughout PSYCH:  Cognitively intact, oriented to person place and time   ASSESSMENT/PLAN:    # Hypertension Blood pressure improved with nifedipine and olmesartan hydrochlorothiazide. Readings slightly above target. - Continue current antihypertensive regimen. - Monitor blood pressure regularly. - Aim for target blood pressure under 130/80.  # Hyperlipidemia Cholesterol levels significantly improved with Repatha, indicating effective management. - Continue Repatha as prescribed. - Monitor lipid profile regularly.  # Stroke prevention On Plavix to reduce risk of cerebrovascular events. - Continue Plavix as  prescribed. - Monitor for signs of bleeding.  # Unintentional weight loss Unintentional weight loss likely due to decreased appetite from Rybelsus. Gummies ineffective for appetite stimulation. - Monitor weight and appetite. - Consider alternative appetite stimulants if weight loss continues.  # Type 2 Diabetes Mellitus Suboptimal glycemic control on Rybelsus. Blood sugars remain high.  Managed by PCP. - Continue Rybelsus and metformin as prescribed. - Monitor blood glucose levels regularly. - Encourage regular physical activity.     Screening for Secondary Hypertension:     10/12/2022    2:27 PM  Causes  Drugs/Herbals Screened     - Comments tries to limit salt, minimal caffeine, occasional EtOH.  Renovascular HTN Screened     - Comments Check renal artery Dopplers.  Sleep Apnea Screened     - Comments snores.  Daytime somnolence.  Thyroid Disease Screened  Hyperaldosteronism Screened     - Comments check renin and aldosterone  Pheochromocytoma N/A  Cushing's Syndrome N/A  Hyperparathyroidism N/A  Coarctation of the Aorta Screened     - Comments BP symmetric  Compliance Screened     - Comments Meds not filled recnelty    Relevant Labs/Studies:    Latest Ref Rng & Units 04/01/2023   11:49 AM 12/28/2022    3:29 PM 08/27/2022    2:30 PM  Basic Labs  Sodium 134 - 144 mmol/L 144  147  142   Potassium 3.5 - 5.2 mmol/L 3.8  3.6  4.0   Creatinine 0.76 - 1.27 mg/dL 1.61  0.96  0.45        Latest Ref Rng & Units 04/07/2022    5:01 PM 07/14/2018    3:07 PM  Thyroid   TSH 0.450 - 4.500 uIU/mL 1.310  0.698        Latest Ref Rng & Units 10/12/2022    3:36 PM  Renin/Aldosterone   Aldosterone 0.0 - 30.0 ng/dL 40.9   Aldos/Renin Ratio 0.0 - 30.0 62.0              04/08/2023   10:34 AM  Renovascular   Renal Artery [place] Completed Yes     Disposition:      FU with Kenneith Stief C. Duke Salvia, MD, [facility] or APP in 6 months.  Medication Adjustments/Labs and Tests Ordered: Current  medicines are reviewed at length with the patient today.  Concerns regarding medicines are outlined above.   No orders of the defined types were placed in this encounter.    Signed, Chilton Si, MD  04/10/2023 8:37 AM    [facility]

## 2023-04-08 NOTE — Patient Instructions (Signed)
 Medication Instructions:  Your physician recommends that you continue on your current medications as directed. Please refer to the Current Medication list given to you today.   Labwork: NONE  Testing/Procedures: NONE  Follow-Up: 6 MONTHS WITH CAITLIN W NP OR DR [place] IN [facility]   If you need a refill on your cardiac medications before your next appointment, please call your pharmacy.

## 2023-04-10 ENCOUNTER — Encounter (HOSPITAL_BASED_OUTPATIENT_CLINIC_OR_DEPARTMENT_OTHER): Payer: Self-pay | Admitting: Cardiovascular Disease

## 2023-04-13 ENCOUNTER — Other Ambulatory Visit (HOSPITAL_COMMUNITY): Payer: Self-pay

## 2023-04-14 ENCOUNTER — Other Ambulatory Visit: Payer: Self-pay

## 2023-04-15 ENCOUNTER — Other Ambulatory Visit (HOSPITAL_COMMUNITY): Payer: Self-pay

## 2023-04-20 ENCOUNTER — Other Ambulatory Visit (HOSPITAL_COMMUNITY): Payer: Self-pay

## 2023-04-25 ENCOUNTER — Other Ambulatory Visit: Payer: Self-pay

## 2023-05-09 ENCOUNTER — Other Ambulatory Visit (HOSPITAL_COMMUNITY): Payer: Self-pay

## 2023-05-15 ENCOUNTER — Encounter (HOSPITAL_BASED_OUTPATIENT_CLINIC_OR_DEPARTMENT_OTHER): Payer: Self-pay | Admitting: Cardiovascular Disease

## 2023-06-20 ENCOUNTER — Other Ambulatory Visit: Payer: Self-pay

## 2023-06-29 ENCOUNTER — Ambulatory Visit (INDEPENDENT_AMBULATORY_CARE_PROVIDER_SITE_OTHER): Payer: Self-pay | Admitting: Nurse Practitioner

## 2023-06-29 ENCOUNTER — Other Ambulatory Visit: Payer: Self-pay

## 2023-06-29 ENCOUNTER — Other Ambulatory Visit (HOSPITAL_COMMUNITY): Payer: Self-pay

## 2023-06-29 ENCOUNTER — Encounter: Payer: Self-pay | Admitting: Nurse Practitioner

## 2023-06-29 VITALS — BP 131/69 | HR 61 | Temp 97.0°F | Wt 138.0 lb

## 2023-06-29 DIAGNOSIS — Z8673 Personal history of transient ischemic attack (TIA), and cerebral infarction without residual deficits: Secondary | ICD-10-CM | POA: Diagnosis not present

## 2023-06-29 DIAGNOSIS — Z13228 Encounter for screening for other metabolic disorders: Secondary | ICD-10-CM

## 2023-06-29 DIAGNOSIS — E782 Mixed hyperlipidemia: Secondary | ICD-10-CM | POA: Diagnosis not present

## 2023-06-29 DIAGNOSIS — Z130 Encounter for screening for diseases of the blood and blood-forming organs and certain disorders involving the immune mechanism: Secondary | ICD-10-CM | POA: Diagnosis not present

## 2023-06-29 DIAGNOSIS — Z1321 Encounter for screening for nutritional disorder: Secondary | ICD-10-CM | POA: Diagnosis not present

## 2023-06-29 DIAGNOSIS — E785 Hyperlipidemia, unspecified: Secondary | ICD-10-CM

## 2023-06-29 DIAGNOSIS — Z1329 Encounter for screening for other suspected endocrine disorder: Secondary | ICD-10-CM

## 2023-06-29 DIAGNOSIS — Z0000 Encounter for general adult medical examination without abnormal findings: Secondary | ICD-10-CM | POA: Insufficient documentation

## 2023-06-29 DIAGNOSIS — E1169 Type 2 diabetes mellitus with other specified complication: Secondary | ICD-10-CM | POA: Diagnosis not present

## 2023-06-29 DIAGNOSIS — R413 Other amnesia: Secondary | ICD-10-CM | POA: Diagnosis not present

## 2023-06-29 DIAGNOSIS — I10 Essential (primary) hypertension: Secondary | ICD-10-CM

## 2023-06-29 DIAGNOSIS — Z7289 Other problems related to lifestyle: Secondary | ICD-10-CM | POA: Diagnosis not present

## 2023-06-29 LAB — POCT GLYCOSYLATED HEMOGLOBIN (HGB A1C): Hemoglobin A1C: 10.7 % — AB (ref 4.0–5.6)

## 2023-06-29 MED ORDER — INSULIN PEN NEEDLE 32G X 4 MM MISC
3 refills | Status: DC
  Filled 2023-06-29: qty 100, 100d supply, fill #0
  Filled 2023-07-06: qty 100, 90d supply, fill #0
  Filled 2023-09-28: qty 100, 90d supply, fill #1

## 2023-06-29 MED ORDER — RYBELSUS 14 MG PO TABS
14.0000 mg | ORAL_TABLET | Freq: Every day | ORAL | 3 refills | Status: DC
Start: 2023-06-29 — End: 2023-11-09

## 2023-06-29 MED ORDER — OLMESARTAN MEDOXOMIL-HCTZ 40-25 MG PO TABS
1.0000 | ORAL_TABLET | Freq: Every day | ORAL | 1 refills | Status: DC
  Filled 2023-06-29 – 2023-08-09 (×2): qty 90, 90d supply, fill #0
  Filled 2023-11-07: qty 90, 90d supply, fill #1

## 2023-06-29 MED ORDER — METFORMIN HCL 1000 MG PO TABS
1000.0000 mg | ORAL_TABLET | Freq: Two times a day (BID) | ORAL | 1 refills | Status: DC
  Filled 2023-06-29 – 2023-08-08 (×2): qty 180, 90d supply, fill #0

## 2023-06-29 MED ORDER — LANTUS SOLOSTAR 100 UNIT/ML ~~LOC~~ SOPN
10.0000 [IU] | PEN_INJECTOR | Freq: Every day | SUBCUTANEOUS | 2 refills | Status: DC
  Filled 2023-06-29: qty 15, 140d supply, fill #0
  Filled 2023-07-01 – 2023-07-20 (×2): qty 9, 84d supply, fill #0
  Filled 2023-08-17: qty 3, 28d supply, fill #0

## 2023-06-29 MED ORDER — NIFEDIPINE ER OSMOTIC RELEASE 60 MG PO TB24
60.0000 mg | ORAL_TABLET | Freq: Two times a day (BID) | ORAL | 1 refills | Status: DC
  Filled 2023-06-29 – 2023-07-18 (×2): qty 180, 90d supply, fill #0
  Filled 2023-10-15: qty 180, 90d supply, fill #1

## 2023-06-29 NOTE — Assessment & Plan Note (Signed)
Continue Plavix 75 mg daily  

## 2023-06-29 NOTE — Assessment & Plan Note (Signed)
 Annual exam as documented.  Counseling done include healthy lifestyle involving committing to 150 minutes of exercise per week, heart healthy diet, and attaining healthy weight. The importance of adequate sleep also discussed.  Regular use of seat belt and home safety were also discussed . Changes in health habits are decided on by patient with goals and time frames set for achieving them. Immunization and cancer screening  needs are specifically addressed at this visit.    Encouraged to get Tdap vaccine, shingles vaccine and pneumococcal vaccine at pharmacy

## 2023-06-29 NOTE — Patient Instructions (Addendum)
 Please consider getting pneumococcal , Shingrix and Tdap vaccine at local pharmacy.   Goal for fasting blood sugar ranges from 80 to 120 and 2 hours after any meal or at bedtime should be between 130 to 170.   1. Memory deficit (Primary)  - Ambulatory referral to Neurology  2. Type 2 diabetes mellitus with hyperlipidemia (HCC)  - Ambulatory referral to Podiatry - AMB Referral [facility] Management - Semaglutide  (RYBELSUS ) 14 MG TABS; Take 1 tablet (14 mg total) by mouth daily.  Dispense: 90 tablet; Refill: 3 - Ambulatory referral to diabetic education - insulin  glargine (LANTUS SOLOSTAR) 100 UNIT/ML Solostar Pen; Inject 10 Units into the skin daily.  Dispense: 15 mL; Refill: 2     It is important that you exercise regularly at least 30 minutes 5 times a week as tolerated  Think about what you will eat, plan ahead. Choose " clean, green, fresh or frozen" over canned, processed or packaged foods which are more sugary, salty and fatty. 70 to 75% of food eaten should be vegetables and fruit. Three meals at set times with snacks allowed between meals, but they must be fruit or vegetables. Aim to eat over a 12 hour period , example 7 am to 7 pm, and STOP after  your last meal of the day. Drink water,generally about 64 ounces per day, no other drink is as healthy. Fruit juice is best enjoyed in a healthy way, by EATING the fruit.  Thanks for choosing [facility] we consider it a privelige to serve you.

## 2023-06-29 NOTE — Addendum Note (Signed)
 Addended by: Jacquetta Mattocks on: 06/29/2023 10:42 PM   Modules accepted: Orders

## 2023-06-29 NOTE — Progress Notes (Addendum)
 Complete physical exam  Patient: Darrell [name]   DOB: 09/01/1955   67 y.o. Male  MRN: [id number]  Subjective:     Chief Complaint  Patient presents with   Diabetes   Annual Exam    Not fasting    Darrell [name] is a 68 y.o. male   has a past medical history of Acute blood loss anemia (04/14/2021), Benign neoplasm of sigmoid colon, Benign prostatic hyperplasia without lower urinary tract symptoms (06/08/2017), Diverticulosis of colon with hemorrhage, Dyslipidemia, Esophageal stricture, Essential hypertension, benign, Expressive aphasia (07/14/2018), Gastroesophageal reflux disease with esophagitis without hemorrhage, History of TIA (transient ischemic attack) (04/14/2021), Hyperlipidemia, Hypertension (01/30/2015), Hypertensive emergency (07/14/2018), Prolonged QT interval (07/14/2018), RBBB, Statin intolerance (08/27/2022), 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 who presents today for a complete physical exam. He reports consuming a general diet.  He does chair exercises, also doing walking exercises daily  He generally feels well. He reports sleeping well.   Memory issues.  He is trying to apply for disability.  Currently on [employer], gets food allowance undergarment.  He stopped working 2 years ago, had a TIA in 2020.  Has had memory issues for about 10 years now.     Type 2 diabetes.  Currently on metformin  1000 mg twice daily, Rybelsus  7 mg daily, states that his diet can be better, uses brown sugar .  He has been out of repatha  injection he reports polyuria.   Need for Tdap vaccine, shingles vaccine and pneumococcal vaccines discussed.  Encourage to get the vaccines at a pharmacy   He would like a referral to podiatry for nail care, referral placed  Most recent fall risk assessment:     01/20/2023    1:29 PM  Fall Risk   Falls in the past year? 0  Number falls in past yr: 0  Injury with Fall? 0     Most recent depression screenings:    06/29/2023    1:26 PM 10/25/2022    3:04 PM  PHQ 2/9 Scores  PHQ - 2 Score 0 0        Patient Care Team: Ethel Meisenheimer R, FNP as PCP - General (Nurse Practitioner) Jannelle Memory, RPH-CPP (Pharmacist)   Outpatient Medications Prior to Visit  Medication Sig   Blood Glucose Monitoring Suppl (ACCU-CHEK AVIVA PLUS) w/Device KIT Use as directed to check blood sugar   clopidogrel  (PLAVIX ) 75 MG tablet Take 1 tablet (75 mg total) by mouth daily.   Continuous Glucose Receiver (FREESTYLE LIBRE 3 READER) DEVI Use to check blood sugar continuously   Continuous Glucose Sensor (FREESTYLE LIBRE 3 PLUS SENSOR) MISC Change sensor every 15 days.   Multiple Vitamin (MULTIVITAMIN) tablet Take 1 tablet by mouth daily.   [DISCONTINUED] metFORMIN  (GLUCOPHAGE ) 1000 MG tablet Take 1 tablet (1,000 mg total) by mouth 2 (two) times daily with a meal.   [DISCONTINUED] NIFEdipine  (PROCARDIA  XL/NIFEDICAL XL) 60 MG 24 hr tablet Take 1 tablet (60 mg total) by mouth in the morning and at bedtime.   [DISCONTINUED] olmesartan -hydrochlorothiazide  (BENICAR  HCT) 40-25 MG tablet Take 1 tablet by mouth daily.   Evolocumab  (REPATHA  SURECLICK) 140 MG/ML SOAJ Inject 140 mg into the skin every 14 (fourteen) days. (Patient not taking: Reported on 06/29/2023)   Glucose Blood (BLOOD GLUCOSE TEST STRIPS) STRP Use to check blood sugar twice daily. May substitute to any  manufacturer covered by [employer]. (Patient not taking: Reported on 06/29/2023)   Lancet Device MISC Use to check blood sugar twice daily. May substitute to any manufacturer covered by patient's insurance. (Patient not taking: Reported on 06/29/2023)   OneTouch Delica Lancets 30G MISC Use to test blood sugar as directed (Patient not taking: Reported on 06/29/2023)   [DISCONTINUED] Semaglutide   (RYBELSUS ) 14 MG TABS Take 1 tablet (14 mg total) by mouth daily. (Patient not taking: Reported on 06/29/2023)   No facility-administered medications prior to visit.    Review of Systems  Constitutional:  Negative for activity change, appetite change, chills and diaphoresis.  HENT:  Negative for congestion, dental problem, drooling and ear discharge.   Eyes:  Negative for pain, discharge, redness and itching.  Respiratory:  Negative for apnea, cough, choking, chest tightness, shortness of breath and wheezing.   Cardiovascular: Negative.  Negative for chest pain, palpitations and leg swelling.  Gastrointestinal:  Negative for abdominal distention, abdominal pain, anal bleeding, blood in stool, constipation, diarrhea and vomiting.  Endocrine: Positive for polyuria. Negative for polydipsia and polyphagia.  Genitourinary:  Negative for difficulty urinating, flank pain, frequency and genital sores.  Musculoskeletal: Negative.  Negative for arthralgias, back pain, gait problem and joint swelling.  Skin:  Negative for color change, pallor and rash.  Neurological:  Negative for dizziness, facial asymmetry, light-headedness, numbness and headaches.  Psychiatric/Behavioral:  Positive for decreased concentration. Negative for agitation, behavioral problems, confusion, hallucinations, self-injury, sleep disturbance and suicidal ideas.        Objective:     BP 131/69   Pulse 61   Temp (!) 97 F (36.1 C)   Wt 138 lb (62.6 kg)   SpO2 97%   BMI 22.96 kg/m    Physical Exam Vitals and nursing note reviewed.  Constitutional:      General: He is not in acute distress.    Appearance: Normal appearance. He is not ill-appearing, toxic-appearing or diaphoretic.  HENT:     Right Ear: Tympanic membrane, ear canal and external ear normal. There is no impacted cerumen.     Left Ear: Tympanic membrane, ear canal and external ear normal. There is no impacted cerumen.     Nose: Nose normal. No congestion  or rhinorrhea.     Mouth/Throat:     Mouth: Mucous membranes are moist.     Pharynx: Oropharynx is clear. No oropharyngeal exudate or posterior oropharyngeal erythema.  Eyes:     General: No scleral icterus.       Right eye: No discharge.        Left eye: No discharge.     Extraocular Movements: Extraocular movements intact.     Conjunctiva/sclera: Conjunctivae normal.  Neck:     Vascular: No carotid bruit.  Cardiovascular:     Rate and Rhythm: Normal rate and regular rhythm.     Pulses: Normal pulses.     Heart sounds: Normal heart sounds. No murmur heard.    No friction rub. No gallop.  Pulmonary:     Effort: Pulmonary effort is normal. No respiratory distress.     Breath sounds: Normal breath sounds. No stridor. No wheezing, rhonchi or rales.  Chest:     Chest wall: No tenderness.  Abdominal:     General: Bowel sounds are normal. There is no distension.     Palpations: Abdomen is soft. There is no mass.     Tenderness: There is no abdominal tenderness. There is no right CVA tenderness, left CVA tenderness,  guarding or rebound.     Hernia: No hernia is present.  Musculoskeletal:        General: No swelling, tenderness, deformity or signs of injury.     Cervical back: Normal range of motion and neck supple. No rigidity or tenderness.     Right lower leg: No edema.     Left lower leg: No edema.  Lymphadenopathy:     Cervical: No cervical adenopathy.  Skin:    General: Skin is warm and dry.     Capillary Refill: Capillary refill takes 2 to 3 seconds.     Coloration: Skin is not jaundiced or pale.     Findings: No bruising, erythema, lesion or rash.  Neurological:     Mental Status: He is alert and oriented to person, place, and time.     Cranial Nerves: No cranial nerve deficit.     Sensory: No sensory deficit.     Motor: No weakness.     Gait: Gait normal.  Psychiatric:        Mood and Affect: Mood normal.        Behavior: Behavior normal.        Thought Content:  Thought content normal.        Judgment: Judgment normal.     Results for orders placed or performed in visit on 06/29/23  POCT glycosylated hemoglobin (Hb A1C)  Result Value Ref Range   Hemoglobin A1C 10.7 (A) 4.0 - 5.6 %   HbA1c POC (<> result, manual entry)     HbA1c, POC (prediabetic range)     HbA1c, POC (controlled diabetic range)         Assessment & Plan:    Routine Health Maintenance and Physical Exam  Immunization History  Administered Date(s) Administered   Influenza Split 11/14/2012   Influenza, Seasonal, Injecte, Preservative Fre 10/25/2022   Influenza,inj,Quad PF,6+ Mos 11/02/2013, 11/09/2014, 12/20/2015   Td 10/13/2010   Tdap 10/13/2010   Zoster, Live 11/04/2011    Health Maintenance  Topic Date Due   Medicare Annual Wellness (AWV)  Never done   Hepatitis C Screening  Never done   Pneumonia Vaccine 67+ Years old (1 of 2 - PCV) Never done   Zoster Vaccines- Shingrix (1 of 2) 10/05/2007   OPHTHALMOLOGY EXAM  01/23/2016   DTaP/Tdap/Td (3 - Td or Tdap) 10/12/2020   COVID-19 Vaccine (1 - 2024-25 season) Never done   INFLUENZA VACCINE  09/02/2023   HEMOGLOBIN A1C  12/30/2023   Diabetic kidney evaluation - eGFR measurement  03/31/2024   Diabetic kidney evaluation - Urine ACR  03/31/2024   FOOT EXAM  06/28/2024   Colonoscopy  04/16/2031   HIV Screening  Completed   HPV VACCINES  Aged Out   Meningococcal B Vaccine  Aged Out    Discussed health benefits of physical activity, and encouraged him to engage in regular exercise appropriate for his age and condition.  Problem List Items Addressed This Visit       Cardiovascular and Mediastinum   Hypertension   Controlled on nifedipine  60 mg twice daily, Benicar -HCT, 40-25 mg 1 tablet daily Encouraged to maintain close follow-up with cardiology DASH diet and commitment to daily physical activity for a minimum of 30 minutes discussed and encouraged, as a part of hypertension management.      06/29/2023     1:31 PM 06/29/2023    1:23 PM 04/08/2023   11:39 AM 04/08/2023   11:03 AM 04/01/2023   11:27 AM 04/01/2023  11:20 AM 02/17/2023    9:43 AM  BP/Weight  Systolic BP 131 153 130 134 122 136 158  Diastolic BP 69 79 74 75 67 84 80  Wt. (Lbs)  138  139.8  144.4 140  BMI  22.96 kg/m2  23.26 kg/m2  24.03 kg/m2 23.3 kg/m2           Relevant Medications   olmesartan -hydrochlorothiazide  (BENICAR  HCT) 40-25 MG tablet   NIFEdipine  (PROCARDIA  XL/NIFEDICAL XL) 60 MG 24 hr tablet     Endocrine   Type 2 diabetes mellitus with hyperlipidemia (HCC)   Lab Results  Component Value Date   HGBA1C 10.7 (A) 06/29/2023  Chronic uncontrolled condition Will contact the clinical pharmacist regarding getting a prescription for Rybelsus  14 mg tablets from [facility] Start Lantus 10 units daily, continue metformin  1000 mg twice daily, continue Rybelsus  7 mg daily in the meantime.  Stop Rybelsus  7 mg and start 14 mg daily once he receives the medication Patient counseled on low-carb diet, referred for diabetic nutrition education Diabetic foot exam completed, stated that he had eye exam done at the [place] this year CBG goals discussed Follow-up in 4 weeks      Relevant Medications   Semaglutide  (RYBELSUS ) 14 MG TABS   insulin  glargine (LANTUS SOLOSTAR) 100 UNIT/ML Solostar Pen   metFORMIN  (GLUCOPHAGE ) 1000 MG tablet   olmesartan -hydrochlorothiazide  (BENICAR  HCT) 40-25 MG tablet   NIFEdipine  (PROCARDIA  XL/NIFEDICAL XL) 60 MG 24 hr tablet   Insulin  Pen Needle 32G X 4 MM MISC   Other Relevant Orders   AMB Referral [facility] Care Management   Ambulatory referral to diabetic education   POCT glycosylated hemoglobin (Hb A1C) (Completed)   Ambulatory referral to Podiatry     Other   Hyperlipidemia   Repatha  140 mg injection  that were mailed to this office for the patient was given to the patient today.  The nurse had tried different times to call the patient regarding picking up the medication from the office       Relevant Medications   olmesartan -hydrochlorothiazide  (BENICAR  HCT) 40-25 MG tablet   NIFEdipine  (PROCARDIA  XL/NIFEDICAL XL) 60 MG 24 hr tablet   History of TIA (transient ischemic attack)   Continue Plavix  75 mg daily      Annual physical exam - Primary   Annual exam as documented.  Counseling done include healthy lifestyle involving committing to 150 minutes of exercise per week, heart healthy diet, and attaining healthy weight. The importance of adequate sleep also discussed.  Regular use of seat belt and home safety were also discussed . Changes in health habits are decided on by patient with goals and time frames set for achieving them. Immunization and cancer screening  needs are specifically addressed at this visit.    Encouraged to get Tdap vaccine, shingles vaccine and pneumococcal vaccine at pharmacy      Memory deficit   Patient referred to neurology      Relevant Orders   Ambulatory referral to Neurology   Other Visit Diagnoses       Screening for endocrine, nutritional, metabolic and immunity disorder       Relevant Orders   CBC   Hepatitis C antibody   PSA      Return in about 4 weeks (around 07/27/2023) for DM.     Lea Walbert R Theodosia Bahena, FNP

## 2023-06-29 NOTE — Assessment & Plan Note (Signed)
 Lab Results  Component Value Date   HGBA1C 10.7 (A) 06/29/2023  Chronic uncontrolled condition Will contact the clinical pharmacist regarding getting a prescription for Rybelsus  14 mg tablets from [facility] Start Lantus 10 units daily, continue metformin  1000 mg twice daily, continue Rybelsus  7 mg daily in the meantime.  Stop Rybelsus  7 mg and start 14 mg daily once he receives the medication Patient counseled on low-carb diet, referred for diabetic nutrition education Diabetic foot exam completed, stated that he had eye exam done at the [place] this year CBG goals discussed Follow-up in 4 weeks

## 2023-06-29 NOTE — Assessment & Plan Note (Signed)
 Controlled on nifedipine  60 mg twice daily, Benicar -HCT, 40-25 mg 1 tablet daily Encouraged to maintain close follow-up with cardiology DASH diet and commitment to daily physical activity for a minimum of 30 minutes discussed and encouraged, as a part of hypertension management.      06/29/2023    1:31 PM 06/29/2023    1:23 PM 04/08/2023   11:39 AM 04/08/2023   11:03 AM 04/01/2023   11:27 AM 04/01/2023   11:20 AM 02/17/2023    9:43 AM  BP/Weight  Systolic BP 131 153 130 134 122 136 158  Diastolic BP 69 79 74 75 67 84 80  Wt. (Lbs)  138  139.8  144.4 140  BMI  22.96 kg/m2  23.26 kg/m2  24.03 kg/m2 23.3 kg/m2

## 2023-06-29 NOTE — Progress Notes (Deleted)
 Established Patient Office Visit  Subjective:  Patient ID: Darrell [name], male    DOB: 02/26/1955  Age: 68 y.o. MRN: [id number]  CC:  Chief Complaint  Patient presents with   Diabetes   Annual Exam    Not fasting    HPI Darrell [name] is a 68 y.o. male with past medical history of *** presents for f/u of *** chronic medical conditions.   On disability due to memory issues. Want to get more money  About 10 years . Had stroke in 2020, stpopped working 2 years ago   On social security right now, trying to get on disability . Gets food allowance    Diet can be better, drinks plenty of water and fluids  , has excesiisve urination. , does chair excecises a lot of time. . Feels very well.sleeping well . Wlk daily . Eats brown    A1c 10.7      Past Medical History:  Diagnosis Date   Acute blood loss anemia 04/14/2021   Benign neoplasm of sigmoid colon    Benign prostatic hyperplasia without lower urinary tract symptoms 06/08/2017   Diverticulosis of colon with hemorrhage    Dyslipidemia    Esophageal stricture    Essential hypertension, benign    Expressive aphasia 07/14/2018   Gastroesophageal reflux disease with esophagitis without hemorrhage    History of TIA (transient ischemic attack) 04/14/2021   Hyperlipidemia    Hypertension 01/30/2015   Hypertensive emergency 07/14/2018   Prolonged QT interval 07/14/2018   RBBB    Statin intolerance 08/27/2022   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, 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] 09/24/2022   Thyroid  cyst    TIA (transient ischemic attack) 07/14/2018   Type 2 diabetes mellitus (HCC)    Type 2 diabetes mellitus with hyperlipidemia (HCC) 01/30/2015   Vitamin D  deficiency     Past Surgical History:  Procedure Laterality Date   COLONOSCOPY WITH PROPOFOL  N/A 04/15/2021   Procedure: COLONOSCOPY WITH PROPOFOL ;  Surgeon: Tobin Forts, MD;  Location: [facility];  Service: Gastroenterology;  Laterality: N/A;   ESOPHAGOGASTRODUODENOSCOPY (EGD) WITH PROPOFOL  N/A  04/15/2021   Procedure: ESOPHAGOGASTRODUODENOSCOPY (EGD) WITH PROPOFOL ;  Surgeon: Tobin Forts, MD;  Location: [facility];  Service: Gastroenterology;  Laterality: N/A;   MELANOMA EXCISION     Back   POLYPECTOMY  04/15/2021   Procedure: POLYPECTOMY;  Surgeon: Tobin Forts, MD;  Location: [facility];  Service: Gastroenterology;;   WISDOM TOOTH EXTRACTION      Family History  Problem Relation Age of Onset   Hypertension Mother    Pulmonary embolism Mother    Diabetes Father    Hypertension Father    Alzheimer's disease Father    Heart disease Brother    Alzheimer's disease Brother    Diabetes Maternal Grandmother    Colon cancer Neg Hx     Social History   Socioeconomic History   Marital status: Single    Spouse name: Not on file   Number of children: 4   Years of education: Not on file   Highest education level: Not on file  Occupational History    Employer: [employer]  Tobacco Use   Smoking status: Never    Passive exposure: Never   Smokeless tobacco: Never  Vaping Use   Vaping status: Never Used  Substance and Sexual Activity   Alcohol use: Yes    Comment: occasional   Drug use: No   Sexual activity: Not on file  Other Topics Concern   Not on file  Social History Narrative   Lives home alone   Social Drivers of Health   Financial Resource Strain: Not on file  Food Insecurity: No Food Insecurity (10/12/2022)   Hunger Vital Sign    Worried About Running Out of Food in the Last Year: Never true    Ran Out of Food in the Last Year: Never true  Transportation Needs: No Transportation Needs (10/12/2022)   PRAPARE - Administrator, Civil Service (Medical): No    Lack of Transportation (Non-Medical): No  Physical Activity: Inactive (10/12/2022)   Exercise Vital Sign    Days of Exercise per Week: 0 days    Minutes of Exercise per Session: 0 min  Stress: Not on file  Social Connections: Not on file  Intimate Partner Violence: Not on file     Outpatient Medications Prior to Visit  Medication Sig Dispense Refill   Blood Glucose Monitoring Suppl (ACCU-CHEK AVIVA PLUS) w/Device KIT Use as directed to check blood sugar 1 kit 0   clopidogrel  (PLAVIX ) 75 MG tablet Take 1 tablet (75 mg total) by mouth daily. 90 tablet 3   Continuous Glucose Receiver (FREESTYLE LIBRE 3 READER) DEVI Use to check blood sugar continuously 1 each 0   Continuous Glucose Sensor (FREESTYLE LIBRE 3 PLUS SENSOR) MISC Change sensor every 15 days. 6 each 3   metFORMIN  (GLUCOPHAGE ) 1000 MG tablet Take 1 tablet (1,000 mg total) by mouth 2 (two) times daily with a meal. 180 tablet 1   Multiple Vitamin (MULTIVITAMIN) tablet Take 1 tablet by mouth daily.     NIFEdipine  (PROCARDIA  XL/NIFEDICAL XL) 60 MG 24 hr tablet Take 1 tablet (60 mg total) by mouth in the morning and at bedtime. 180 tablet 1   olmesartan -hydrochlorothiazide  (BENICAR  HCT) 40-25 MG tablet Take 1 tablet by mouth daily. 90 tablet 1   Semaglutide  (RYBELSUS ) 14 MG TABS Take 1 tablet (14 mg total) by mouth daily. 90 tablet 1   Evolocumab  (REPATHA  SURECLICK) 140 MG/ML SOAJ Inject 140 mg into the skin every 14 (fourteen) days. (Patient not taking: Reported on 06/29/2023) 6 mL 2   Glucose Blood (BLOOD GLUCOSE TEST STRIPS) STRP Use to check blood sugar twice daily. May substitute to any manufacturer covered by patient's insurance. (Patient not taking: Reported on 06/29/2023) 100 strip 3   Lancet Device MISC Use to check blood sugar twice daily. May substitute to any manufacturer covered by patient's insurance. (Patient not taking: Reported on 06/29/2023) 1 each 0   OneTouch Delica Lancets 30G MISC Use to test blood sugar as directed (Patient not taking: Reported on 06/29/2023) 100 each 3   No facility-administered medications prior to visit.    Allergies  Allergen Reactions   Statins Other (See Comments)    Joint Pain    ROS Review of Systems    Objective:     Physical Exam  BP 131/69   Pulse 61    Temp (!) 97 F (36.1 C)   Wt 138 lb (62.6 kg)   SpO2 97%   BMI 22.96 kg/m  Wt Readings from Last 3 Encounters:  06/29/23 138 lb (62.6 kg)  04/08/23 139 lb 12.8 oz (63.4 kg)  04/01/23 144 lb 6.4 oz (65.5 kg)    Lab Results  Component Value Date   TSH 1.310 04/07/2022   Lab Results  Component Value Date   WBC 8.0 04/07/2022   HGB 15.0 04/07/2022   HCT 42.8 04/07/2022   MCV 93 04/07/2022   PLT 200 04/07/2022  Lab Results  Component Value Date   NA 144 04/01/2023   K 3.8 04/01/2023   CO2 26 04/01/2023   GLUCOSE 270 (H) 04/01/2023   BUN 13 04/01/2023   CREATININE 0.82 04/01/2023   BILITOT 0.6 08/27/2022   ALKPHOS 56 08/27/2022   AST 12 08/27/2022   ALT 10 08/27/2022   PROT 6.7 08/27/2022   ALBUMIN 4.5 08/27/2022   CALCIUM  9.6 04/01/2023   ANIONGAP 9 04/15/2021   EGFR 97 04/01/2023   Lab Results  Component Value Date   CHOL 110 04/01/2023   Lab Results  Component Value Date   HDL 55 04/01/2023   Lab Results  Component Value Date   LDLCALC 38 04/01/2023   Lab Results  Component Value Date   TRIG 88 04/01/2023   Lab Results  Component Value Date   CHOLHDL 2.0 04/01/2023   Lab Results  Component Value Date   HGBA1C 9.2 (A) 04/01/2023      Assessment & Plan:   Problem List Items Addressed This Visit   None   No orders of the defined types were placed in this encounter.   Follow-up: No follow-ups on file.    Ademide Schaberg R Landis Dowdy, FNP

## 2023-06-29 NOTE — Assessment & Plan Note (Signed)
Patient referred to neurology

## 2023-06-29 NOTE — Assessment & Plan Note (Signed)
 Repatha  140 mg injection  that were mailed to this office for the patient was given to the patient today.  The nurse had tried different times to call the patient regarding picking up the medication from the office

## 2023-06-30 ENCOUNTER — Telehealth: Payer: Self-pay

## 2023-06-30 ENCOUNTER — Encounter: Payer: Self-pay | Admitting: Pharmacist

## 2023-06-30 ENCOUNTER — Other Ambulatory Visit (HOSPITAL_COMMUNITY): Payer: Self-pay

## 2023-06-30 ENCOUNTER — Other Ambulatory Visit: Payer: Self-pay

## 2023-06-30 DIAGNOSIS — Z79899 Other long term (current) drug therapy: Secondary | ICD-10-CM

## 2023-06-30 LAB — CBC
Hematocrit: 40.2 % (ref 37.5–51.0)
Hemoglobin: 13.4 g/dL (ref 13.0–17.7)
MCH: 32.4 pg (ref 26.6–33.0)
MCHC: 33.3 g/dL (ref 31.5–35.7)
MCV: 97 fL (ref 79–97)
Platelets: 213 10*3/uL (ref 150–450)
RBC: 4.14 x10E6/uL (ref 4.14–5.80)
RDW: 12.2 % (ref 11.6–15.4)
WBC: 7.2 10*3/uL (ref 3.4–10.8)

## 2023-06-30 LAB — HEPATITIS C ANTIBODY: Hep C Virus Ab: NONREACTIVE

## 2023-06-30 LAB — PSA: Prostate Specific Ag, Serum: 0.7 ng/mL (ref 0.0–4.0)

## 2023-06-30 NOTE — Progress Notes (Signed)
   06/30/2023  Patient ID: Stella Edward, male   DOB: 11/07/1955, 68 y.o.   MRN: [id number]  According to the [employer]o Nord[employer] Medication Assistance Program, this patient's medication can only be shipped to the prescribing provider's office. I see that Radene Buffalo, CPhT sent a fax to the company regarding the prescription update for Rybelsus , I will look into this.   Regarding Repatha , it appears the patient is receiving it through a [facility]. The dispensing pharmacy may be able to coordinate delivery or shipment of the medication directly.  Alexandria Angel, PharmD Clinical Pharmacist Cell: [phone]

## 2023-06-30 NOTE — Telephone Encounter (Signed)
-----   Message from Folashade R Paseda sent at 06/29/2023  6:19 PM EDT ----- Good evening. How can we ensure that the patient gets repatha  and rybelsus  shipped to his house? He also has not received rybelsus  14 mg tablets that was ordered  some months ago. Can you assist with these?  Thanks

## 2023-07-01 ENCOUNTER — Telehealth: Payer: Self-pay

## 2023-07-01 ENCOUNTER — Other Ambulatory Visit (HOSPITAL_COMMUNITY): Payer: Self-pay

## 2023-07-01 ENCOUNTER — Other Ambulatory Visit: Payer: Self-pay

## 2023-07-01 DIAGNOSIS — Z79899 Other long term (current) drug therapy: Secondary | ICD-10-CM

## 2023-07-01 NOTE — Progress Notes (Signed)
   07/01/2023  Patient ID: Darrell [name], male   DOB: 04-Nov-1955, 68 y.o.   MRN: [id number]  I called patient to give an update of his inquiry he discussed with his PCP,  but was unable to leave a message.   Alexandria Angel, PharmD Clinical Pharmacist Cell: [phone]

## 2023-07-04 ENCOUNTER — Other Ambulatory Visit: Payer: Self-pay

## 2023-07-04 ENCOUNTER — Other Ambulatory Visit (HOSPITAL_COMMUNITY): Payer: Self-pay

## 2023-07-05 ENCOUNTER — Other Ambulatory Visit: Payer: Self-pay

## 2023-07-05 ENCOUNTER — Telehealth: Payer: Self-pay

## 2023-07-05 ENCOUNTER — Telehealth: Payer: Self-pay | Admitting: *Deleted

## 2023-07-05 DIAGNOSIS — Z79899 Other long term (current) drug therapy: Secondary | ICD-10-CM

## 2023-07-05 NOTE — Progress Notes (Signed)
 Complex Care Management Care Guide Note  07/05/2023 Name: Darrell [name] MRN: [id number] DOB: October 29, 1955  Darrell [name] is a 68 y.o. year old male who is a primary care patient of Paseda, Folashade R, FNP and is actively engaged with the care management team. I reached out to Darrell [name] by phone today to assist with re-scheduling  with the Pharmacist.  Follow up plan: Unsuccessful telephone outreach attempt made. A HIPAA compliant phone message was left for the patient providing contact information and requesting a return call.  Barnie Bora  [facility]  [facility] Guide  Direct Dial: [phone]  Fax [phone]

## 2023-07-05 NOTE — Progress Notes (Signed)
   07/05/2023  Patient ID: Darrell [name], male   DOB: [birth year], 68 y.o.   MRN: [id number]  I called the patient but was unable to reach. PCP was previously made aware of unsuccessful outreaches.   Alexandria Angel, PharmD Clinical Pharmacist Cell: [phone]

## 2023-07-06 ENCOUNTER — Encounter: Payer: Self-pay | Admitting: Pharmacist

## 2023-07-06 ENCOUNTER — Other Ambulatory Visit: Payer: Self-pay

## 2023-07-07 ENCOUNTER — Other Ambulatory Visit (HOSPITAL_COMMUNITY): Payer: Self-pay

## 2023-07-07 NOTE — Telephone Encounter (Signed)
 Lvm for call back . KH

## 2023-07-11 ENCOUNTER — Other Ambulatory Visit: Payer: Self-pay

## 2023-07-11 NOTE — Progress Notes (Unsigned)
 Complex Care Management Note Care Guide Note  07/11/2023 Name: Darrell [name] MRN: [id number] DOB: 1955-09-16   Complex Care Management Outreach Attempts: A second unsuccessful outreach was attempted today to offer the patient with information about available complex care management services.  Follow Up Plan:  Additional outreach attempts will be made to offer the patient complex care management information and services.   Encounter Outcome:  No Answer  Barnie Bora  [facility]  [facility], [facility] Guide  Direct Dial: [phone]  Fax [phone]

## 2023-07-14 NOTE — Progress Notes (Signed)
 Complex Care Management Care Guide Note  07/14/2023 Name: Darrell [name] MRN: [id number] DOB: 05/06/1955  Darrell [name] is a 68 y.o. year old male who is a primary care patient of Paseda, Folashade R, FNP and is actively engaged with the care management team. I reached out to Darrell [name] by phone today to assist with re-scheduling  with the Pharmacist.  Follow up plan: Unsuccessful telephone outreach attempt made. A HIPAA compliant phone message was left for the patient providing contact information and requesting a return call. No further outreach attempts will be made at this time. We have been unable to contact the patient to reschedule for complex care management services.   Barnie Bora  [facility]  [facility] Guide  Direct Dial: [phone]  Fax [phone]

## 2023-07-18 ENCOUNTER — Other Ambulatory Visit: Payer: Self-pay

## 2023-07-18 ENCOUNTER — Other Ambulatory Visit (HOSPITAL_COMMUNITY): Payer: Self-pay

## 2023-07-18 ENCOUNTER — Encounter: Payer: Self-pay | Admitting: Podiatry

## 2023-07-18 ENCOUNTER — Telehealth: Payer: Self-pay | Admitting: Nurse Practitioner

## 2023-07-18 ENCOUNTER — Other Ambulatory Visit: Payer: Self-pay | Admitting: Nurse Practitioner

## 2023-07-18 ENCOUNTER — Ambulatory Visit (INDEPENDENT_AMBULATORY_CARE_PROVIDER_SITE_OTHER): Admitting: Podiatry

## 2023-07-18 DIAGNOSIS — M79675 Pain in left toe(s): Secondary | ICD-10-CM

## 2023-07-18 DIAGNOSIS — E1142 Type 2 diabetes mellitus with diabetic polyneuropathy: Secondary | ICD-10-CM | POA: Diagnosis not present

## 2023-07-18 DIAGNOSIS — B351 Tinea unguium: Secondary | ICD-10-CM

## 2023-07-18 DIAGNOSIS — E1169 Type 2 diabetes mellitus with other specified complication: Secondary | ICD-10-CM

## 2023-07-18 DIAGNOSIS — M79674 Pain in right toe(s): Secondary | ICD-10-CM

## 2023-07-18 MED ORDER — RYBELSUS 14 MG PO TABS
14.0000 mg | ORAL_TABLET | Freq: Every day | ORAL | 1 refills | Status: DC
  Filled 2023-07-18: qty 90, 90d supply, fill #0
  Filled 2023-07-20: qty 30, 30d supply, fill #0
  Filled 2023-07-20: qty 90, 90d supply, fill #0
  Filled 2023-07-20: qty 30, 30d supply, fill #0
  Filled 2023-07-25: qty 120, 120d supply, fill #0
  Filled 2023-11-09: qty 60, 60d supply, fill #1

## 2023-07-18 NOTE — Progress Notes (Signed)
  Subjective:  Patient ID: Darrell [name], male    DOB: 01-01-1956,   MRN: [id number]  Chief Complaint  Patient presents with   Diabetic Foot Care    Rm9/ diabetic/    68 y.o. male presents for concern of thickened elongated and painful nails that are difficult to trim. Requesting to have them trimmed today. Relates burning and tingling in their feet. Patient is diabetic and last A1c was  Lab Results  Component Value Date   HGBA1C 10.7 (A) 06/29/2023   .   PCP:  Paseda, Folashade R, FNP    . Denies any other pedal complaints. Denies n/v/f/c.   Past Medical History:  Diagnosis Date   Acute blood loss anemia 04/14/2021   Benign neoplasm of sigmoid colon    Benign prostatic hyperplasia without lower urinary tract symptoms 06/08/2017   Diverticulosis of colon with hemorrhage    Dyslipidemia    Esophageal stricture    Essential hypertension, benign    Expressive aphasia 07/14/2018   Gastroesophageal reflux disease with esophagitis without hemorrhage    History of TIA (transient ischemic attack) 04/14/2021   Hyperlipidemia    Hypertension 01/30/2015   Hypertensive emergency 07/14/2018   Prolonged QT interval 07/14/2018   RBBB    Statin intolerance 08/27/2022   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, T46.6X5A] 09/24/2022   Thyroid  cyst    TIA (transient ischemic attack) 07/14/2018   Type 2 diabetes mellitus (HCC)    Type 2 diabetes mellitus with hyperlipidemia (HCC) 01/30/2015   Vitamin D  deficiency     Objective:  Physical Exam: Vascular: DP/PT pulses 2/4 bilateral. CFT <3 seconds. Absent hair growth on digits. Edema noted to bilateral lower extremities. Xerosis noted bilaterally.  Skin. No lacerations or abrasions bilateral feet. Nails 1-5 bilateral  are thickened discolored and elongated with subungual debris.  Musculoskeletal: MMT 5/5 bilateral lower extremities in DF, PF, Inversion and Eversion. Deceased ROM in DF of ankle joint.  Neurological: Sensation intact to light touch. Protective  sensation diminished bilateral.    Assessment:   1. Pain due to onychomycosis of toenails of both feet   2. Type 2 diabetes mellitus with peripheral neuropathy (HCC)      Plan:  Patient was evaluated and treated and all questions answered. -Discussed and educated patient on diabetic foot care, especially with  regards to the vascular, neurological and musculoskeletal systems.  -Stressed the importance of good glycemic control and the detriment of not  controlling glucose levels in relation to the foot. -Discussed supportive shoes at all times and checking feet regularly.  -Mechanically debrided all nails 1-5 bilateral using sterile nail nipper and filed with dremel without incident  -Answered all patient questions -Patient to return  in 3 months for at risk foot care -Patient advised to call the office if any problems or questions arise in the meantime.   Jennefer Moats, DPM

## 2023-07-18 NOTE — Telephone Encounter (Signed)
 Patient stated he would like to speak to [facility], because he did not received 2 of his medication  He stated the Pharmacy do not have it. Its 14 mg of rebelion and insulin . He is sitting in the waiting room  MRN [id number]

## 2023-07-19 ENCOUNTER — Other Ambulatory Visit (HOSPITAL_COMMUNITY): Payer: Self-pay

## 2023-07-19 ENCOUNTER — Other Ambulatory Visit: Payer: Self-pay

## 2023-07-19 MED ORDER — REPATHA SURECLICK 140 MG/ML ~~LOC~~ SOAJ
140.0000 mg | SUBCUTANEOUS | 2 refills | Status: DC
  Filled 2023-07-19: qty 6, 84d supply, fill #0
  Filled 2023-07-20 – 2023-08-17 (×2): qty 2, 28d supply, fill #0

## 2023-07-19 NOTE — Telephone Encounter (Signed)
 Pt was advised [facility]

## 2023-07-20 ENCOUNTER — Telehealth: Payer: Self-pay

## 2023-07-20 ENCOUNTER — Other Ambulatory Visit: Payer: Self-pay

## 2023-07-20 ENCOUNTER — Other Ambulatory Visit (HOSPITAL_COMMUNITY): Payer: Self-pay

## 2023-07-20 NOTE — Telephone Encounter (Signed)
 Pt advised if you have a PA  for this pt . Thank you.   Darrell [name]   CMA II

## 2023-07-21 ENCOUNTER — Other Ambulatory Visit: Payer: Self-pay

## 2023-07-25 ENCOUNTER — Other Ambulatory Visit: Payer: Self-pay

## 2023-07-25 ENCOUNTER — Telehealth: Payer: Self-pay

## 2023-07-25 ENCOUNTER — Other Ambulatory Visit (HOSPITAL_COMMUNITY): Payer: Self-pay

## 2023-07-25 NOTE — Telephone Encounter (Signed)
 Rybelsus  14mg  ([employer]ovo Nordisk Patient Assistance Program): 4 bottles, #120 tablets, received today and shipped to patient. [address] [place], [place] [address]

## 2023-07-28 NOTE — Telephone Encounter (Signed)
 Pt lvm per DPR. KH

## 2023-07-29 ENCOUNTER — Other Ambulatory Visit (HOSPITAL_COMMUNITY): Payer: Self-pay

## 2023-08-08 ENCOUNTER — Other Ambulatory Visit (HOSPITAL_COMMUNITY): Payer: Self-pay

## 2023-08-08 ENCOUNTER — Other Ambulatory Visit: Payer: Self-pay

## 2023-08-09 ENCOUNTER — Other Ambulatory Visit: Payer: Self-pay

## 2023-08-09 ENCOUNTER — Other Ambulatory Visit (HOSPITAL_COMMUNITY): Payer: Self-pay

## 2023-08-11 NOTE — Progress Notes (Signed)
 Diabetes Self-Management Education  Visit Type: First/Initial  Appt. Start Time: 1135 Appt. End Time: 1220  08/19/2023  [name], identified by name and date of birth, is a 68 y.o. male with a diagnosis of Diabetes: Type 2.   ASSESSMENT  Patient arrived today late for his appointment alone. Pt would like improve blood sugar and reduce medications for diabetes. Patient lives with alone.  Pt reports she does the shopping and cooking. Pt reports he is planning to start Lantus  today. Pt c/o poor appetite with typical intake of two meals daily. Pt reports eating out rarely. Pt states he would like to go to the gym and has a membership and enjoys lifting weights. All Pt's questions were answered during this encounter.   History includes:   Past Medical History:  Diagnosis Date   Acute blood loss anemia 04/14/2021   Benign neoplasm of sigmoid colon    Benign prostatic hyperplasia without lower urinary tract symptoms 06/08/2017   Diverticulosis of colon with hemorrhage    Dyslipidemia    Esophageal stricture    Essential hypertension, benign    Expressive aphasia 07/14/2018   Gastroesophageal reflux disease with esophagitis without hemorrhage    History of TIA (transient ischemic attack) 04/14/2021   Hyperlipidemia    Hypertension 01/30/2015   Hypertensive emergency 07/14/2018   Prolonged QT interval 07/14/2018   RBBB    Statin intolerance 08/27/2022   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, 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] 09/24/2022   Thyroid  cyst    TIA (transient ischemic attack) 07/14/2018   Type 2 diabetes mellitus (HCC)    Type 2 diabetes mellitus with hyperlipidemia (HCC) 01/30/2015   Vitamin D  deficiency     Medications include:   Current Outpatient Medications:    Continuous Glucose Sensor (FREESTYLE LIBRE 3 PLUS SENSOR) MISC, Change sensor every 15 days., Disp: 6 each, Rfl: 3   metFORMIN  (GLUCOPHAGE ) 1000 MG tablet, Take 1 tablet (1,000 mg total) by mouth 2 (two) times daily with a meal., Disp: 180  tablet, Rfl: 1   Semaglutide  (RYBELSUS ) 14 MG TABS, Take 1 tablet (14 mg total) by mouth daily., Disp: 90 tablet, Rfl: 3   Blood Glucose Monitoring Suppl (ACCU-CHEK AVIVA PLUS) w/Device KIT, Use as directed to check blood sugar, Disp: 1 kit, Rfl: 0   clopidogrel  (PLAVIX ) 75 MG tablet, Take 1 tablet (75 mg total) by mouth daily., Disp: 90 tablet, Rfl: 3   Continuous Glucose Receiver (FREESTYLE LIBRE 3 READER) DEVI, Use to check blood sugar continuously, Disp: 1 each, Rfl: 0   Evolocumab  (REPATHA  SURECLICK) 140 MG/ML SOAJ, Inject 140 mg into the skin every 14 (fourteen) days., Disp: 6 mL, Rfl: 2   Glucose Blood (BLOOD GLUCOSE TEST STRIPS) STRP, Use to check blood sugar twice daily. May substitute to any manufacturer covered by patient's insurance. (Patient not taking: Reported on 08/17/2023), Disp: 100 strip, Rfl: 3   insulin  glargine (LANTUS  SOLOSTAR) 100 UNIT/ML Solostar Pen, Inject 10 Units into the skin daily. Pen expires 28 days after first use (Patient not taking: Reported on 08/19/2023), Disp: 15 mL, Rfl: 2   Insulin  Pen Needle 32G X 4 MM MISC, Use to inject insulin  once daily as directed, Disp: 100 each, Rfl: 3   Lancet Device MISC, Use to check blood sugar twice daily. May substitute to any manufacturer covered by patient's insurance., Disp: 1 each, Rfl: 0   Multiple Vitamin (MULTIVITAMIN) tablet, Take 1 tablet by mouth daily., Disp: , Rfl:    NIFEdipine  (PROCARDIA  XL/NIFEDICAL XL) 60 MG 24 hr tablet,  Take 1 tablet (60 mg total) by mouth in the morning and at bedtime., Disp: 180 tablet, Rfl: 1   olmesartan -hydrochlorothiazide  (BENICAR  HCT) 40-25 MG tablet, Take 1 tablet by mouth daily., Disp: 90 tablet, Rfl: 1   OneTouch Delica Lancets 30G MISC, Use to test blood sugar as directed, Disp: 100 each, Rfl: 3   potassium chloride  SA (KLOR-CON  M) 20 MEQ tablet, Take 2 tablets (40 mEq total) by mouth daily., Disp: 4 tablet, Rfl: 0   Semaglutide  (RYBELSUS ) 14 MG TABS, Take 1 tablet (14 mg total) by mouth  daily., Disp: 90 tablet, Rfl: 1   Labs noted:   Lab Results  Component Value Date   CHOL 110 04/01/2023   HDL 55 04/01/2023   LDLCALC 38 04/01/2023   LDLDIRECT 39 08/17/2023   TRIG 88 04/01/2023   CHOLHDL 2.0 04/01/2023   Lab Results  Component Value Date   HGBA1C 10.7 (A) 06/29/2023   Wt Readings from Last 3 Encounters:  08/17/23 135 lb (61.2 kg)  06/29/23 138 lb (62.6 kg)  04/08/23 139 lb 12.8 oz (63.4 kg)   There were no vitals taken for this visit. There is no height or weight on file to calculate BMI.   Diabetes Self-Management Education - 08/19/23 1141       Visit Information   Visit Type First/Initial      Initial Visit   Diabetes Type Type 2    Date Diagnosed 2001    Are you currently following a meal plan? No    Are you taking your medications as prescribed? No      Health Coping   How would you rate your overall health? Good      Psychosocial Assessment   Patient Belief/Attitude about Diabetes Defeat/Burnout    What is the hardest part about your diabetes right now, causing you the most concern, or is the most worrisome to you about your diabetes?   Making healty food and beverage choices    Self-care barriers None    Self-management support Doctor's office    Other persons present Patient    Patient Concerns Nutrition/Meal planning    Special Needs None    Preferred Learning Style No preference indicated    Learning Readiness Contemplating    How often do you need to have someone help you when you read instructions, pamphlets, or other written materials from your doctor or pharmacy? 1 - Never    What is the last grade level you completed in school? some college      Pre-Education Assessment   Patient understands the diabetes disease and treatment process. Needs Review    Patient understands incorporating nutritional management into lifestyle. Needs Review    Patient undertands incorporating physical activity into lifestyle. Needs Instruction     Patient understands using medications safely. Needs Review    Patient understands monitoring blood glucose, interpreting and using results Needs Review    Patient understands prevention, detection, and treatment of acute complications. Needs Review    Patient understands prevention, detection, and treatment of chronic complications. Needs Review    Patient understands how to develop strategies to address psychosocial issues. Needs Review    Patient understands how to develop strategies to promote health/change behavior. Needs Review      Complications   Last HgB A1C per patient/outside source 10.7 %    How often do you check your blood sugar? > 4 times/day    Postprandial Blood glucose range (mg/dL) >799    Number of  hypoglycemic episodes per month 0   nausea, weak   Number of hyperglycemic episodes ( >200mg /dL): Daily    Can you tell when your blood sugar is high? No    Have you had a dilated eye exam in the past 12 months? Yes    Have you had a dental exam in the past 12 months? Yes    Are you checking your feet? Yes    How many days per week are you checking your feet? 7      Dietary Intake   Breakfast 2 cups fruit loops, 2% or eggs, spam, water or no sugar added juice    Snack (morning) grapes or apple or cherries or berries    Lunch skips    Snack (afternoon) cucumber, boiled egg    Tourist information centre manager whopper with cheese, lettuce, mayo, tomato, onion, onion rings, diet coke or taco dip made with ground beef, salsa, cheese, 1 handful corn chips, water, 2%, arnold palmer    Beverage(s) coconut water, diet coke, ~ 33 oz water, no sugar added juice      Activity / Exercise   Activity / Exercise Type ADL's      Patient Education   Previous Diabetes Education No    Disease Pathophysiology Definition of diabetes, type 1 and 2, and the diagnosis of diabetes    Healthy Eating Role of diet in the treatment of diabetes and the relationship between the three main macronutrients and blood  glucose level;Plate Method;Carbohydrate counting;Food label reading, portion sizes and measuring food.;Reviewed blood glucose goals for pre and post meals and how to evaluate the patients' food intake on their blood glucose level.    Being Active Role of exercise on diabetes management, blood pressure control and cardiac health.    Medications Reviewed patients medication for diabetes, action, purpose, timing of dose and side effects.    Monitoring Identified appropriate SMBG and/or A1C goals.    Acute complications Taught prevention, symptoms, and  treatment of hypoglycemia - the 15 rule.    Chronic complications Relationship between chronic complications and blood glucose control;Dental care;Retinopathy and reason for yearly dilated eye exams    Diabetes Stress and Support Worked with patient to identify barriers to care and solutions    Lifestyle and Health Coping Lifestyle issues that need to be addressed for better diabetes care      Individualized Goals (developed by patient)   Nutrition Follow meal plan discussed    Physical Activity 30 minutes per day;Exercise 1-2 times per week;Exercise 3-5 times per week    Medications take my medication as prescribed    Monitoring  Consistenly use CGM    Problem Solving Addressing barriers to behavior change;Eating Pattern;Medication consistency    Reducing Risk examine blood glucose patterns;treat hypoglycemia with 15 grams of carbs if blood glucose less than 70mg /dL;do foot checks daily    Health Coping Ask for help with psychological, social, or emotional issues      Post-Education Assessment   Patient understands the diabetes disease and treatment process. Needs Review    Patient understands incorporating nutritional management into lifestyle. Needs Review    Patient undertands incorporating physical activity into lifestyle. Needs Review    Patient understands using medications safely. Needs Review    Patient understands monitoring blood  glucose, interpreting and using results Needs Review    Patient understands prevention, detection, and treatment of acute complications. Needs Review    Patient understands prevention, detection, and treatment of chronic complications. Needs Review  Patient understands how to develop strategies to address psychosocial issues. Needs Review    Patient understands how to develop strategies to promote health/change behavior. Needs Review      Outcomes   Expected Outcomes Demonstrated interest in learning. Expect positive outcomes    Future DMSE 3-4 months    Program Status Not Completed          Individualized Plan for Diabetes Self-Management Training:   Learning Objective:  Patient will have a greater understanding of diabetes self-management. Patient education plan is to attend individual and/or group sessions per assessed needs and concerns.   Plan:   Patient Instructions  Join the gym aiming for 2-3 days weekly for 30 minutes Purchase glucose tab for hypoglycemia Consider having a snack or meal or or meal replacement in replace of a skipped meal     Expected Outcomes:  Demonstrated interest in learning. Expect positive outcomes  Education material provided: ADA - How to Thrive: A Guide for Your Journey with Diabetes, My Plate, Snack sheet, and Diabetes Resources  If problems or questions, patient to contact team via:  Phone  Future DSME appointment: 3-4 months

## 2023-08-17 ENCOUNTER — Encounter: Payer: Self-pay | Admitting: Nurse Practitioner

## 2023-08-17 ENCOUNTER — Ambulatory Visit (INDEPENDENT_AMBULATORY_CARE_PROVIDER_SITE_OTHER): Payer: Self-pay | Admitting: Nurse Practitioner

## 2023-08-17 ENCOUNTER — Other Ambulatory Visit: Payer: Self-pay

## 2023-08-17 ENCOUNTER — Other Ambulatory Visit (HOSPITAL_COMMUNITY): Payer: Self-pay

## 2023-08-17 VITALS — BP 119/73 | HR 68 | Temp 96.8°F | Wt 135.0 lb

## 2023-08-17 DIAGNOSIS — E785 Hyperlipidemia, unspecified: Secondary | ICD-10-CM

## 2023-08-17 DIAGNOSIS — I10 Essential (primary) hypertension: Secondary | ICD-10-CM | POA: Diagnosis not present

## 2023-08-17 DIAGNOSIS — R413 Other amnesia: Secondary | ICD-10-CM | POA: Diagnosis not present

## 2023-08-17 DIAGNOSIS — E1169 Type 2 diabetes mellitus with other specified complication: Secondary | ICD-10-CM | POA: Diagnosis not present

## 2023-08-17 NOTE — Patient Instructions (Addendum)
 Goal for fasting blood sugar ranges from 80 to 120 and 2 hours after any meal or at bedtime should be between 130 to 170.   Please call the neurologist on [phone] to schedule an appointment for your memory issues    It is important that you exercise regularly at least 30 minutes 5 times a week as tolerated  Think about what you will eat, plan ahead. Choose  clean, green, fresh or frozen over canned, processed or packaged foods which are more sugary, salty and fatty. 70 to 75% of food eaten should be vegetables and fruit. Three meals at set times with snacks allowed between meals, but they must be fruit or vegetables. Aim to eat over a 12 hour period , example 7 am to 7 pm, and STOP after  your last meal of the day. Drink water,generally about 64 ounces per day, no other drink is as healthy. Fruit juice is best enjoyed in a healthy way, by EATING the fruit.  Thanks for choosing [facility] we consider it a privelige to serve you.

## 2023-08-17 NOTE — Progress Notes (Signed)
 Established Patient Office Visit  Subjective:  Patient ID: Darrell [name], male    DOB: 1955-07-31  Age: 68 y.o. MRN: [id number]  CC:  Chief Complaint  Patient presents with   Hypertension    HPI Darrell [name] is a 68 y.o. male   has a past medical history of Acute blood loss anemia (04/14/2021), Benign neoplasm of sigmoid colon, Benign prostatic hyperplasia without lower urinary tract symptoms (06/08/2017), Diverticulosis of colon with hemorrhage, Dyslipidemia, Esophageal stricture, Essential hypertension, benign, Expressive aphasia (07/14/2018), Gastroesophageal reflux disease with esophagitis without hemorrhage, History of TIA (transient ischemic attack) (04/14/2021), Hyperlipidemia, Hypertension (01/30/2015), Hypertensive emergency (07/14/2018), Prolonged QT interval (07/14/2018), RBBB, Statin intolerance (08/27/2022), 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  Patient presents for follow-up for his chronic medical conditions   Hypertension.  Currently on olmesartan -hydrochlorothiazide  40-25 mg 1 tablet daily, nifedipine  60 mg twice daily.  Currently denies chest pain shortness of breath edema  Uncontrolled type 2 diabetes.  On Rybelsus  14 mg daily, metformin  1000 mg twice daily, he has not received Lantus  from the pharmacy.  Stated that his blood sugar at home has been in the 200s, takes Repatha  every 2 weeks for hyperlipidemia, Repatha  was last taking a week ago       Past Medical History:  Diagnosis Date   Acute blood loss anemia 04/14/2021   Benign neoplasm of sigmoid colon    Benign prostatic hyperplasia without lower urinary tract symptoms 06/08/2017   Diverticulosis of colon with hemorrhage    Dyslipidemia    Esophageal stricture    Essential hypertension, benign    Expressive aphasia  07/14/2018   Gastroesophageal reflux disease with esophagitis without hemorrhage    History of TIA (transient ischemic attack) 04/14/2021   Hyperlipidemia    Hypertension 01/30/2015   Hypertensive emergency 07/14/2018   Prolonged QT interval 07/14/2018   RBBB    Statin intolerance 08/27/2022   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, 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] 09/24/2022   Thyroid  cyst    TIA (transient ischemic attack) 07/14/2018   Type 2 diabetes mellitus (HCC)    Type 2 diabetes mellitus with hyperlipidemia (HCC) 01/30/2015   Vitamin D  deficiency     Past Surgical History:  Procedure Laterality Date   COLONOSCOPY WITH PROPOFOL  N/A 04/15/2021   Procedure: COLONOSCOPY WITH PROPOFOL ;  Surgeon: Abran Norleen SAILOR, MD;  Location: [facility];  Service: Gastroenterology;  Laterality: N/A;   ESOPHAGOGASTRODUODENOSCOPY (EGD) WITH PROPOFOL  N/A 04/15/2021   Procedure: ESOPHAGOGASTRODUODENOSCOPY (EGD) WITH PROPOFOL ;  Surgeon: Abran Norleen SAILOR, MD;  Location: [facility];  Service: Gastroenterology;  Laterality: N/A;   MELANOMA EXCISION     Back   POLYPECTOMY  04/15/2021   Procedure: POLYPECTOMY;  Surgeon: Abran Norleen SAILOR, MD;  Location: [facility];  Service: Gastroenterology;;   WISDOM TOOTH EXTRACTION      Family History  Problem Relation Age of Onset   Hypertension Mother    Pulmonary embolism Mother    Diabetes Father    Hypertension Father    Alzheimer's disease Father    Heart disease Brother    Alzheimer's disease Brother    Diabetes Maternal Grandmother    Colon cancer Neg Hx     Social History   Socioeconomic History   Marital status: Single    Spouse name: Not on file   Number of children: 4   Years of education: Not on file  Highest education level: Not on file  Occupational History    Employer: [employer]  Tobacco Use   Smoking status: Never    Passive exposure: Never   Smokeless tobacco: Never  Vaping Use   Vaping status: Never Used  Substance and Sexual Activity   Alcohol use: Yes    Comment:  occasional   Drug use: No   Sexual activity: Not on file  Other Topics Concern   Not on file  Social History Narrative   Lives home alone   Social Drivers of Health   Financial Resource Strain: Not on file  Food Insecurity: No Food Insecurity (10/12/2022)   Hunger Vital Sign    Worried About Running Out of Food in the Last Year: Never true    Ran Out of Food in the Last Year: Never true  Transportation Needs: No Transportation Needs (10/12/2022)   PRAPARE - Administrator, Civil Service (Medical): No    Lack of Transportation (Non-Medical): No  Physical Activity: Inactive (10/12/2022)   Exercise Vital Sign    Days of Exercise per Week: 0 days    Minutes of Exercise per Session: 0 min  Stress: Not on file  Social Connections: Not on file  Intimate Partner Violence: Not on file    Outpatient Medications Prior to Visit  Medication Sig Dispense Refill   Blood Glucose Monitoring Suppl (ACCU-CHEK AVIVA PLUS) w/Device KIT Use as directed to check blood sugar 1 kit 0   clopidogrel  (PLAVIX ) 75 MG tablet Take 1 tablet (75 mg total) by mouth daily. 90 tablet 3   Continuous Glucose Receiver (FREESTYLE LIBRE 3 READER) DEVI Use to check blood sugar continuously 1 each 0   Continuous Glucose Sensor (FREESTYLE LIBRE 3 PLUS SENSOR) MISC Change sensor every 15 days. 6 each 3   Evolocumab  (REPATHA  SURECLICK) 140 MG/ML SOAJ Inject 140 mg into the skin every 14 (fourteen) days. 6 mL 2   Insulin  Pen Needle 32G X 4 MM MISC Use to inject insulin  once daily as directed 100 each 3   Lancet Device MISC Use to check blood sugar twice daily. May substitute to any manufacturer covered by patient's insurance. 1 each 0   metFORMIN  (GLUCOPHAGE ) 1000 MG tablet Take 1 tablet (1,000 mg total) by mouth 2 (two) times daily with a meal. 180 tablet 1   Multiple Vitamin (MULTIVITAMIN) tablet Take 1 tablet by mouth daily.     NIFEdipine  (PROCARDIA  XL/NIFEDICAL XL) 60 MG 24 hr tablet Take 1 tablet (60 mg total)  by mouth in the morning and at bedtime. 180 tablet 1   olmesartan -hydrochlorothiazide  (BENICAR  HCT) 40-25 MG tablet Take 1 tablet by mouth daily. 90 tablet 1   OneTouch Delica Lancets 30G MISC Use to test blood sugar as directed 100 each 3   Semaglutide  (RYBELSUS ) 14 MG TABS Take 1 tablet (14 mg total) by mouth daily. 90 tablet 3   Semaglutide  (RYBELSUS ) 14 MG TABS Take 1 tablet (14 mg total) by mouth daily. 90 tablet 1   Glucose Blood (BLOOD GLUCOSE TEST STRIPS) STRP Use to check blood sugar twice daily. May substitute to any manufacturer covered by patient's insurance. (Patient not taking: Reported on 08/17/2023) 100 strip 3   insulin  glargine (LANTUS  SOLOSTAR) 100 UNIT/ML Solostar Pen Inject 10 Units into the skin daily. Pen expires 28 days after first use (Patient not taking: Reported on 08/17/2023) 15 mL 2   No facility-administered medications prior to visit.    Allergies  Allergen Reactions   Statins  Other (See Comments)    Joint Pain    ROS Review of Systems  Constitutional:  Negative for appetite change, chills, fatigue and fever.  HENT:  Negative for congestion, postnasal drip, rhinorrhea and sneezing.   Respiratory:  Negative for cough, shortness of breath and wheezing.   Cardiovascular:  Negative for chest pain, palpitations and leg swelling.  Gastrointestinal:  Negative for abdominal pain, constipation, nausea and vomiting.  Genitourinary:  Negative for difficulty urinating, dysuria, flank pain and frequency.  Musculoskeletal:  Negative for arthralgias, back pain, joint swelling and myalgias.  Skin:  Negative for color change, pallor, rash and wound.  Neurological:  Negative for dizziness, facial asymmetry, weakness, numbness and headaches.  Psychiatric/Behavioral:  Negative for behavioral problems, confusion, self-injury and suicidal ideas.       Objective:    Physical Exam Vitals and nursing note reviewed.  Constitutional:      General: He is not in acute distress.     Appearance: Normal appearance. He is not ill-appearing, toxic-appearing or diaphoretic.  Eyes:     General: No scleral icterus.       Right eye: No discharge.        Left eye: No discharge.     Extraocular Movements: Extraocular movements intact.     Conjunctiva/sclera: Conjunctivae normal.  Cardiovascular:     Rate and Rhythm: Normal rate and regular rhythm.     Pulses: Normal pulses.     Heart sounds: Normal heart sounds. No murmur heard.    No friction rub. No gallop.  Pulmonary:     Effort: Pulmonary effort is normal. No respiratory distress.     Breath sounds: Normal breath sounds. No stridor. No wheezing, rhonchi or rales.  Chest:     Chest wall: No tenderness.  Abdominal:     General: There is no distension.     Palpations: Abdomen is soft.     Tenderness: There is no abdominal tenderness. There is no right CVA tenderness, left CVA tenderness or guarding.  Musculoskeletal:        General: No swelling, tenderness, deformity or signs of injury.     Right lower leg: No edema.     Left lower leg: No edema.  Skin:    General: Skin is warm and dry.     Capillary Refill: Capillary refill takes less than 2 seconds.     Coloration: Skin is not jaundiced or pale.     Findings: No bruising, erythema or lesion.  Neurological:     Mental Status: He is alert and oriented to person, place, and time.     Motor: No weakness.     Coordination: Coordination normal.     Gait: Gait normal.  Psychiatric:        Mood and Affect: Mood normal.        Behavior: Behavior normal.        Thought Content: Thought content normal.        Judgment: Judgment normal.     BP 119/73   Pulse 68   Temp (!) 96.8 F (36 C)   Wt 135 lb (61.2 kg)   SpO2 96%   BMI 22.47 kg/m  Wt Readings from Last 3 Encounters:  08/17/23 135 lb (61.2 kg)  06/29/23 138 lb (62.6 kg)  04/08/23 139 lb 12.8 oz (63.4 kg)    Lab Results  Component Value Date   TSH 1.310 04/07/2022   Lab Results  Component Value  Date   WBC 7.2 06/29/2023  HGB 13.4 06/29/2023   HCT 40.2 06/29/2023   MCV 97 06/29/2023   PLT 213 06/29/2023   Lab Results  Component Value Date   NA 144 04/01/2023   K 3.8 04/01/2023   CO2 26 04/01/2023   GLUCOSE 270 (H) 04/01/2023   BUN 13 04/01/2023   CREATININE 0.82 04/01/2023   BILITOT 0.6 08/27/2022   ALKPHOS 56 08/27/2022   AST 12 08/27/2022   ALT 10 08/27/2022   PROT 6.7 08/27/2022   ALBUMIN 4.5 08/27/2022   CALCIUM  9.6 04/01/2023   ANIONGAP 9 04/15/2021   EGFR 97 04/01/2023   Lab Results  Component Value Date   CHOL 110 04/01/2023   Lab Results  Component Value Date   HDL 55 04/01/2023   Lab Results  Component Value Date   LDLCALC 38 04/01/2023   Lab Results  Component Value Date   TRIG 88 04/01/2023   Lab Results  Component Value Date   CHOLHDL 2.0 04/01/2023   Lab Results  Component Value Date   HGBA1C 10.7 (A) 06/29/2023      Assessment & Plan:   Problem List Items Addressed This Visit       Cardiovascular and Mediastinum   Hypertension - Primary   Controlled on nifedipine  60 mg twice daily, Benicar -HCT 40-25 mg 1 tablet daily Continue current medications DASH diet and commitment to daily physical activity for a minimum of 30 minutes discussed and encouraged, as a part of hypertension management.     08/17/2023    3:33 PM 08/17/2023    3:22 PM 06/29/2023    1:31 PM 06/29/2023    1:23 PM 04/08/2023   11:39 AM 04/08/2023   11:03 AM 04/01/2023   11:27 AM  BP/Weight  Systolic BP 119 118 131 153 130 134 122  Diastolic BP 73 43 69 79 74 75 67  Wt. (Lbs)  135  138  139.8   BMI  22.47 kg/m2  22.96 kg/m2  23.26 kg/m2              Endocrine   Type 2 diabetes mellitus with hyperlipidemia (HCC)   Lab Results  Component Value Date   HGBA1C 10.7 (A) 06/29/2023  Continue metformin  1000 mg twice daily, Rybelsus  14 mg daily The clinical pharmacist has reached out to the pharmacy Lantus  and Repatha  will be mailed to the patient Counseled on  low-carb diet, CBG goals provided Appreciate collaboration with the clinical pharmacist Follow-up in 2 months      Relevant Orders   Basic Metabolic Panel   Direct LDL     Other   Memory deficit   Neurology office phone number provided, advised to call them to schedule an appointment       No orders of the defined types were placed in this encounter.   Follow-up: Return in about 2 months (around 10/18/2023).    Kamy Poinsett R Quashon Jesus, FNP

## 2023-08-17 NOTE — Assessment & Plan Note (Signed)
 Lab Results  Component Value Date   HGBA1C 10.7 (A) 06/29/2023  Continue metformin  1000 mg twice daily, Rybelsus  14 mg daily The clinical pharmacist has reached out to the pharmacy Lantus  and Repatha  will be mailed to the patient Counseled on low-carb diet, CBG goals provided Appreciate collaboration with the clinical pharmacist Follow-up in 2 months

## 2023-08-17 NOTE — Assessment & Plan Note (Signed)
 Neurology office phone number provided, advised to call them to schedule an appointment

## 2023-08-17 NOTE — Assessment & Plan Note (Signed)
 Controlled on nifedipine  60 mg twice daily, Benicar -HCT 40-25 mg 1 tablet daily Continue current medications DASH diet and commitment to daily physical activity for a minimum of 30 minutes discussed and encouraged, as a part of hypertension management.     08/17/2023    3:33 PM 08/17/2023    3:22 PM 06/29/2023    1:31 PM 06/29/2023    1:23 PM 04/08/2023   11:39 AM 04/08/2023   11:03 AM 04/01/2023   11:27 AM  BP/Weight  Systolic BP 119 118 131 153 130 134 122  Diastolic BP 73 43 69 79 74 75 67  Wt. (Lbs)  135  138  139.8   BMI  22.47 kg/m2  22.96 kg/m2  23.26 kg/m2

## 2023-08-17 NOTE — Progress Notes (Signed)
 Consulted patient in room following PCP appointment with Lorice Shall, NP. Patient was not ever shipped Lantus  from [facility] due to inability to pay $82 copay for 3 mo supply. He also took his last dose of Repatha  yesterday and does not understand how to request a refill. I coordinated with [facility] to facilitate mail order of Repatha  ($0 per mo via [facility]) and Lantus  ($18.12 per mo via insurance). I requested for them to ship Lantus  on an AR account, as the patient reports he is not able to pay this copay at this time. Moving forward, will attempt to enroll patient in PAP program for insulin , as he is already approved for Novo Nordisk PAP for Rybelsus . Will continue to follow closely. In-person pharmacy appointment scheduled for next month.   Lorain Baseman, PharmD [facility] [phone]

## 2023-08-18 ENCOUNTER — Other Ambulatory Visit: Payer: Self-pay

## 2023-08-18 ENCOUNTER — Other Ambulatory Visit (HOSPITAL_COMMUNITY): Payer: Self-pay

## 2023-08-18 LAB — BASIC METABOLIC PANEL WITH GFR
BUN/Creatinine Ratio: 15 (ref 10–24)
BUN: 15 mg/dL (ref 8–27)
CO2: 25 mmol/L (ref 20–29)
Calcium: 9.8 mg/dL (ref 8.6–10.2)
Chloride: 98 mmol/L (ref 96–106)
Creatinine, Ser: 1.02 mg/dL (ref 0.76–1.27)
Glucose: 183 mg/dL — ABNORMAL HIGH (ref 70–99)
Potassium: 3.1 mmol/L — ABNORMAL LOW (ref 3.5–5.2)
Sodium: 142 mmol/L (ref 134–144)
eGFR: 82 mL/min/1.73 (ref >59)

## 2023-08-18 LAB — LDL CHOLESTEROL, DIRECT: LDL Direct: 39 mg/dL (ref 0–99)

## 2023-08-19 ENCOUNTER — Other Ambulatory Visit: Payer: Self-pay

## 2023-08-19 ENCOUNTER — Encounter: Attending: Nurse Practitioner | Admitting: Dietician

## 2023-08-19 ENCOUNTER — Ambulatory Visit: Payer: Self-pay | Admitting: Nurse Practitioner

## 2023-08-19 ENCOUNTER — Other Ambulatory Visit (HOSPITAL_COMMUNITY): Payer: Self-pay

## 2023-08-19 DIAGNOSIS — E1169 Type 2 diabetes mellitus with other specified complication: Secondary | ICD-10-CM | POA: Insufficient documentation

## 2023-08-19 DIAGNOSIS — E785 Hyperlipidemia, unspecified: Secondary | ICD-10-CM | POA: Insufficient documentation

## 2023-08-19 DIAGNOSIS — E876 Hypokalemia: Secondary | ICD-10-CM

## 2023-08-19 MED ORDER — POTASSIUM CHLORIDE CRYS ER 20 MEQ PO TBCR
40.0000 meq | EXTENDED_RELEASE_TABLET | Freq: Every day | ORAL | 0 refills | Status: DC
  Filled 2023-08-19: qty 4, 2d supply, fill #0

## 2023-08-19 NOTE — Patient Instructions (Addendum)
 Join the gym aiming for 2-3 days weekly for 30 minutes Purchase glucose tab for hypoglycemia Consider having a balanced snack or meal or or meal replacement in replace of a skipped meal

## 2023-08-20 ENCOUNTER — Other Ambulatory Visit: Payer: Self-pay | Admitting: Nurse Practitioner

## 2023-08-20 DIAGNOSIS — E876 Hypokalemia: Secondary | ICD-10-CM

## 2023-08-22 ENCOUNTER — Other Ambulatory Visit (HOSPITAL_COMMUNITY): Payer: Self-pay

## 2023-08-23 ENCOUNTER — Other Ambulatory Visit: Payer: Self-pay

## 2023-08-23 ENCOUNTER — Telehealth: Payer: Self-pay

## 2023-08-23 NOTE — Telephone Encounter (Signed)
 Signed Novo Nordisk MAP application for [place] was provided to Medication Access Team. Will continue to follow for approval.   Lorain Baseman, PharmD [facility] [phone]

## 2023-08-23 NOTE — Telephone Encounter (Signed)
 Submitted application for TRESIBA FLEXTOUCH to [employer]OVO [employer]ORD[employer]K for patient assistance.   Phone: [phone]

## 2023-08-26 ENCOUNTER — Telehealth: Payer: Self-pay | Admitting: Nurse Practitioner

## 2023-08-26 ENCOUNTER — Telehealth: Payer: Self-pay

## 2023-08-26 NOTE — Telephone Encounter (Signed)
 Done [facility]

## 2023-08-26 NOTE — Telephone Encounter (Signed)
 Copied from CRM [id number]. Topic: General - Other >> Aug 26, 2023  2:29 PM Turkey B wrote: Reason for CRM: pt called in says needs notice sent to the [employer] stating that he is on insulin  and it needs to be refrigerated. Pt didn't have a fx number to give me

## 2023-08-26 NOTE — Telephone Encounter (Signed)
 Copied from CRM [id number]. Topic: General - Other >> Aug 26, 2023 11:56 AM [name] wrote: Reason for CRM: Patient wants to know if [name] can send [employer] an email stating he uses insulin . [employer] address is [email or web address] and their phone number is [phone]. The patient can be contacted at [phone]

## 2023-08-26 NOTE — Telephone Encounter (Signed)
 Pt advised he needs a letter for [employer] advising he uses insulin .   This would be to help light bill. Please advise. If so we can email it to [email or web address]  [phone]

## 2023-08-29 ENCOUNTER — Other Ambulatory Visit: Payer: Self-pay

## 2023-09-14 ENCOUNTER — Encounter (HOSPITAL_COMMUNITY): Payer: Self-pay

## 2023-09-14 ENCOUNTER — Other Ambulatory Visit (HOSPITAL_COMMUNITY): Payer: Self-pay

## 2023-09-14 ENCOUNTER — Other Ambulatory Visit: Payer: Self-pay

## 2023-09-14 ENCOUNTER — Ambulatory Visit: Payer: Self-pay

## 2023-09-14 ENCOUNTER — Telehealth (HOSPITAL_COMMUNITY): Payer: Self-pay | Admitting: Pharmacy Technician

## 2023-09-14 ENCOUNTER — Telehealth (HOSPITAL_COMMUNITY): Payer: Self-pay

## 2023-09-14 DIAGNOSIS — E1169 Type 2 diabetes mellitus with other specified complication: Secondary | ICD-10-CM | POA: Diagnosis not present

## 2023-09-14 DIAGNOSIS — E785 Hyperlipidemia, unspecified: Secondary | ICD-10-CM | POA: Diagnosis not present

## 2023-09-14 DIAGNOSIS — E782 Mixed hyperlipidemia: Secondary | ICD-10-CM | POA: Diagnosis not present

## 2023-09-14 MED ORDER — TRESIBA FLEXTOUCH 100 UNIT/ML ~~LOC~~ SOPN
10.0000 [IU] | PEN_INJECTOR | Freq: Every day | SUBCUTANEOUS | 5 refills | Status: DC
  Filled 2023-09-14 (×3): qty 3, 30d supply, fill #0
  Filled 2023-10-07: qty 3, 30d supply, fill #1
  Filled 2023-10-18 (×2): qty 12, 120d supply, fill #2

## 2023-09-14 MED ORDER — METFORMIN HCL ER 500 MG PO TB24
1000.0000 mg | ORAL_TABLET | Freq: Two times a day (BID) | ORAL | 3 refills | Status: AC
  Filled 2023-09-14 (×2): qty 360, 90d supply, fill #0
  Filled 2023-12-07: qty 360, 90d supply, fill #1
  Filled 2024-03-06: qty 360, 90d supply, fill #2

## 2023-09-14 MED ORDER — REPATHA SURECLICK 140 MG/ML ~~LOC~~ SOAJ
140.0000 mg | SUBCUTANEOUS | 2 refills | Status: AC
  Filled 2023-09-14 (×4): qty 6, 84d supply, fill #0
  Filled 2023-12-21: qty 6, 84d supply, fill #1

## 2023-09-14 NOTE — Telephone Encounter (Signed)
 Pharmacy Patient Advocate Encounter  Received notification from [facility] that Prior Authorization for Repatha  SureClick 140MG /ML auto-injectors  has been APPROVED from 09/14/23 to 03/16/24. Ran test claim, Copay is $0. This test claim was processed through [facility]- copay amounts may vary at other pharmacies due to pharmacy/plan contracts, or as the patient moves through the different stages of their insurance plan.   PA #/Case ID/Reference #: [id number]

## 2023-09-14 NOTE — Telephone Encounter (Signed)
 PA request has been Received. New Encounter has been or will be created for follow up. For additional info see Pharmacy Prior Auth telephone encounter from 09/14/23.

## 2023-09-14 NOTE — Patient Instructions (Addendum)
 It was nice to see you today!  Your goal blood sugar is 80-130 before eating and less than 180 after eating. Please continue using your [facility] monitor.   Medication Changes: Continue insulin  degludec (Tresiba ) 10 units once daily. Please pick this up from [facility] pharmacy this week before you run out of Lantus . After that, we will let you know when the shipment arrives from this assistance program  Pharmacy address: [address], [place], [place] [address]  Continue all other medication the same.   Monitor blood sugars at home and keep a log (glucometer or piece of paper) to bring with you to your next visit.  Keep up the good work with diet and exercise. Aim for a diet full of vegetables, fruit and lean meats (chicken, [place], fish). Try to limit salt intake by eating fresh or frozen vegetables (instead of canned), rinse canned vegetables prior to cooking and do not add any additional salt to meals.

## 2023-09-14 NOTE — Progress Notes (Signed)
 09/14/2023 Name: Darrell [name] MRN: [id number] DOB: 08-13-1955  Chief Complaint  Patient presents with   Diabetes    Darrell [name] is a 68 y.o. year old male who was referred for medication management by their primary care provider, Paseda, Folashade R, FNP. They presented for a face to face visit today.    They were referred to the pharmacist by their PCP for assistance in managing diabetes  PMH includes HTN, TIA (2020), T2DM, HLD with statin myopathy, BPH.   Subjective: Patient was last seen by PCP, Lorice Shall, NP, on 08/17/23. At last visit, patient reported taking Rybelsus  and metformin  as prescribed, but had not received Lantus  from the pharmacy. I consulted with the patient in the room and assisted in facilitating shipment of Repatha  ($0 via [employer]) and Lantus  ($18.12 for 30ds on an AR account). We then submitted an application for him to receive Tresiba , along with Rybelsus  from [facility]. Shipment has not yet been received. Patient completed an appt with the nutritionist on 08/19/23.   Today, patient presents in  good spirits and presents without  any assistance. He is accompanied by his daughter Chriss. He reports that he has been taking his medications as prescribed and his BG have been well controlled since starting insulin . He has 2-3 days of Lantus  remaining before he will need additional medication.    Care Team: Primary Care Provider: Paseda, Folashade R, FNP ; Next Scheduled Visit: 10/18/23   Medication Access/Adherence  Current Pharmacy:  DARR[facility] LONG - [facility] [address] [place] [address] Phone: [phone] Fax: [phone]   Patient reports affordability concerns with their medications: Yes  Patient reports access/transportation concerns to their pharmacy: Yes  - reports he would be able to go to [facility] pharmacy to pick up a sample of Tresiba  with his daughter's assistance Patient reports adherence concerns with their  medications:  No  - has had issues with adherence in the past, but currently appears adherent. Brought his medications with him for review today.    Diabetes:  Current medications: metformin  IR 1000 mg BID, Rybelsus  14 mg daily, insulin  glargine (Lantus ) 10 units daily  He reports he does have some diarrhea occasionally. Denies abdominal cramping, nausea, vomiting.   Current glucose readings:  Using FL3 meter and reader; testing continuously  Date of Download: 09/14/23 - 14 day report  Time sensor is active 97% Average Glucose: 149 mg/dL Time in Goal:  - Time in range 70-180: 84% - Time above range: 16% - Time below range: 0% Observed patterns:  Patient denies hypoglycemic s/sx including dizziness, shakiness, sweating. Patient denies hyperglycemic symptoms including polyuria, polydipsia, polyphagia, nocturia, neuropathy, blurred vision.  Current meal patterns: Trying eat 3 meals/day, but appetite is on the lower side. May only eat half a meal at a time. Reports meals consists of fruits (cherries, grapes) and vegetables (carrots, vegetables). For protein - eats black beans, pinto beans.  - Drinks: water, sparking water, occasional diet drink  Current physical activity: walks his dog, chair exercises at home  Current medication access support:  - Novo Nordisk PAP - Rybelsus , Tresiba  (awaiting shipment) - expires 02/01/24  Hyperlipidemia/ASCVD Risk Reduction  Current lipid lowering medications: Repatha  140 mg subcutaneous every 14 days Medications tried in the past: statins (myopathy)  Antiplatelet regimen: clopidogrel  75 mg daily  ASCVD History: TIA  Risk Factors: T2DM, HTN, HLD  Current medication access support:  - Repatha  - [facility]  - expires 11/27/23  Clinical  ASCVD: Yes  The ASCVD Risk score (Arnett DK, et al., 2019) failed to calculate for the following reasons:   Risk score cannot be calculated because patient has a medical history suggesting prior/existing ASCVD     Objective:  BP Readings from Last 3 Encounters:  08/17/23 119/73  06/29/23 131/69  04/08/23 130/74    Lab Results  Component Value Date   HGBA1C 10.7 (A) 06/29/2023   HGBA1C 9.2 (A) 04/01/2023   HGBA1C 7.7 (A) 12/28/2022       Latest Ref Rng & Units 08/17/2023    3:48 PM 04/01/2023   11:49 AM 12/28/2022    3:29 PM  BMP  Glucose 70 - 99 mg/dL 816  729  837   BUN 8 - 27 mg/dL 15  13  12    Creatinine 0.76 - 1.27 mg/dL 8.97  9.17  9.13   BUN/Creat Ratio 10 - 24 15  16  14    Sodium 134 - 144 mmol/L 142  144  147   Potassium 3.5 - 5.2 mmol/L 3.1  3.8  3.6   Chloride 96 - 106 mmol/L 98  103  102   CO2 20 - 29 mmol/L 25  26  26    Calcium  8.6 - 10.2 mg/dL 9.8  9.6  89.8     Lab Results  Component Value Date   CHOL 110 04/01/2023   HDL 55 04/01/2023   LDLCALC 38 04/01/2023   LDLDIRECT 39 08/17/2023   TRIG 88 04/01/2023   CHOLHDL 2.0 04/01/2023    Medications Reviewed Today     Reviewed by Brinda Lorain SQUIBB, RPH (Pharmacist) on 09/14/23 at 1602  Med List Status: <None>   Medication Order Taking? Sig Documenting Provider Last Dose Status Informant  Blood Glucose Monitoring Suppl (ACCU-CHEK AVIVA PLUS) w/Device KIT [id number]  Use as directed to check blood sugar Paseda, Folashade R, FNP  Active   clopidogrel  (PLAVIX ) 75 MG tablet [id number] Yes Take 1 tablet (75 mg total) by mouth daily. Gregg Lek, MD  Active   Continuous Glucose Receiver (FREESTYLE LIBRE 3 READER) DEVI [id number] Yes Use to check blood sugar continuously Paseda, Folashade R, FNP  Active   Continuous Glucose Sensor (FREESTYLE LIBRE 3 PLUS SENSOR) MISC [id number] Yes Change sensor every 15 days. Paseda, Folashade R, FNP  Active   Evolocumab  (REPATHA  SURECLICK) 140 MG/ML SOAJ [id number]  Inject 140 mg into the skin every 14 (fourteen) days. Paseda, Folashade R, FNP  Active   Glucose Blood (BLOOD GLUCOSE TEST STRIPS) STRP [id number]  Use to check blood sugar twice daily. May substitute to any manufacturer covered  by patient's insurance.  Patient not taking: Reported on 08/17/2023   Paseda, Folashade R, FNP  Active   insulin  degludec (TRESIBA  FLEXTOUCH) 100 UNIT/ML FlexTouch Pen [id number] Yes Inject 10 Units into the skin daily. Paseda, Folashade R, FNP  Active    Discontinued 09/14/23 1434 (Change in therapy)            Med Note>> Brinda Lorain SQUIBB, [facility]   09/14/2023  2:34 PM Switching to Tresiba  PAP    Insulin  Pen Needle 32G X 4 MM MISC [id number]  Use to inject insulin  once daily as directed Paseda, Folashade R, FNP  Active   Lancet Device MISC [id number]  Use to check blood sugar twice daily. May substitute to any manufacturer covered by patient's insurance. Paseda, Folashade R, FNP  Active   metFORMIN  (GLUCOPHAGE ) 1000 MG tablet [id number] Yes Take 1 tablet (1,000 mg total) by mouth 2 (two) times  daily with a meal. Paseda, Folashade R, FNP  Active   Multiple Vitamin (MULTIVITAMIN) tablet [id number]  Take 1 tablet by mouth daily. [provider]  Active Self  NIFEdipine  (PROCARDIA  XL/NIFEDICAL XL) 60 MG 24 hr tablet [id number] Yes Take 1 tablet (60 mg total) by mouth in the morning and at bedtime. Paseda, Folashade R, FNP  Active   olmesartan -hydrochlorothiazide  (BENICAR  HCT) 40-25 MG tablet [id number] Yes Take 1 tablet by mouth daily. Paseda, Folashade R, FNP  Active   OneTouch Delica Lancets 30G [place] [id number]  Use to test blood sugar as directed Paseda, Folashade R, FNP  Active   potassium chloride  SA (KLOR-CON  M) 20 MEQ tablet [id number]  Take 2 tablets (40 mEq total) by mouth daily. Paseda, Folashade R, FNP  Active   Semaglutide  (RYBELSUS ) 14 MG TABS [id number] Yes Take 1 tablet (14 mg total) by mouth daily. Paseda, Folashade R, FNP  Active   Semaglutide  (RYBELSUS ) 14 MG TABS [id number] Yes Take 1 tablet (14 mg total) by mouth daily. Paseda, Folashade R, FNP  Active               Assessment/Plan:   Diabetes: - Currently controlled with 14-day TIR of 84% per FL3 readings above goal > 70%. Much  improved from last A1C of 10.7%. Patient is tolerating his current regimen well and appears he is responding well to insulin . He does report some appetite suppression that I will continue to monitor, as he may have less side effects with Rybelsus  7 mg daily. Also recommended switching to XR formulation of metformin  for better GI tolerability, especially with GLP-1RA. Congratulated on improvement! - Last UACR Feb 2025 - 23 mg/g - Patient denies personal or family history of multiple endocrine neoplasia type 2, medullary thyroid  cancer; personal history of pancreatitis or gallbladder disease. - Reviewed long term cardiovascular and renal outcomes of uncontrolled blood sugar - Reviewed goal A1c, goal fasting, and goal 2 hour post prandial glucose - Reviewed hypoglycemia management plan and the rule of 15 - Reviewed dietary modifications including  utilizing the healthy plate method, limiting portion size of carbohydrate foods, increasing intake of protein and non-starchy vegetables. Counseled patient to stay hydrated with water throughout the day. - Reviewed lifestyle modifications including: aiming for 150 minutes of moderate intensity exercise every week.  - Recommend to continue Rybelsus  14 mg daily - Recommend to transition from insulin  glargine (Lantus ) to insulin  degludec (Tresiba ) 10 units daily for patient assistance. Collaborated with patient advocate, Burnard Lot, CPhT, to obtain sample of Tresiba  U100 flexpen that patient can pick up from [facility] while awaiting his Novo Nordisk shipment. - At next filled, recommend to switch from metformin  IR to metformin  XR 1000 mg BID for tolerability - Recommend to check glucose continuously with FL3 CGM and reader. Patient not interested in connecting to his phone at this time. Counseled patient to bring glucometer or BG log to every appointment. - Next A1C due 10/30/23      Hyperlipidemia/ASCVD Risk Reduction: - Currently controlled with most recent LDL-C  of 39 mg/dL below goal < 70 mg/dL given hx of TIA + U7IF. Patient tolerating Repatha  well. Will ask pharmacy to fill 3 mo supply at a time for improved adherence. - Reviewed long term complications of uncontrolled cholesterol - Recommend to continue Repatha  140 mg [place] every 14 days   Written patient instructions provided. Patient verbalized understanding of treatment plan.   Follow Up Plan:  Pharmacist 11/09/23 PCP clinic visit 10/18/23   Lorain Baseman, PharmD  [facility] [phone]

## 2023-09-14 NOTE — Telephone Encounter (Signed)
 Pharmacy Patient Advocate Encounter   Received notification from Pt Calls Messages that prior authorization for Repatha  SureClick 140MG /ML auto-injectors  is required/requested.   Insurance verification completed.   The patient is insured through [employer] .   Per test claim: PA required; PA submitted to above mentioned insurance via Latent Key/confirmation #/EOC [id number] Status is pending

## 2023-09-15 ENCOUNTER — Other Ambulatory Visit: Payer: Self-pay

## 2023-09-26 ENCOUNTER — Other Ambulatory Visit: Payer: Self-pay

## 2023-09-28 ENCOUNTER — Other Ambulatory Visit: Payer: Self-pay

## 2023-09-28 ENCOUNTER — Encounter: Payer: Self-pay | Admitting: Pharmacist

## 2023-10-04 ENCOUNTER — Other Ambulatory Visit: Payer: Self-pay

## 2023-10-07 ENCOUNTER — Other Ambulatory Visit: Payer: Self-pay

## 2023-10-12 ENCOUNTER — Other Ambulatory Visit: Payer: Self-pay

## 2023-10-18 ENCOUNTER — Other Ambulatory Visit (HOSPITAL_COMMUNITY): Payer: Self-pay

## 2023-10-18 ENCOUNTER — Other Ambulatory Visit: Payer: Self-pay

## 2023-10-18 ENCOUNTER — Ambulatory Visit: Payer: Self-pay | Admitting: Nurse Practitioner

## 2023-10-18 DIAGNOSIS — E1169 Type 2 diabetes mellitus with other specified complication: Secondary | ICD-10-CM

## 2023-10-18 MED ORDER — PEN NEEDLES 32G X 6 MM MISC
3 refills | Status: AC
  Filled 2023-10-18: qty 200, 200d supply, fill #0
  Filled 2023-10-18: qty 200, fill #0

## 2023-10-18 NOTE — Progress Notes (Signed)
 Updating order for insulin  pen needles to 32x6 mm so the pharmacy can dispense his supply from [facility] PAP.  Lorain Baseman, PharmD [facility] [phone]

## 2023-10-19 ENCOUNTER — Ambulatory Visit (INDEPENDENT_AMBULATORY_CARE_PROVIDER_SITE_OTHER): Admitting: Podiatry

## 2023-10-19 ENCOUNTER — Other Ambulatory Visit: Payer: Self-pay

## 2023-10-19 DIAGNOSIS — Z91199 Patient's noncompliance with other medical treatment and regimen due to unspecified reason: Secondary | ICD-10-CM

## 2023-10-19 NOTE — Progress Notes (Signed)
 No show

## 2023-11-07 ENCOUNTER — Other Ambulatory Visit: Payer: Self-pay

## 2023-11-07 NOTE — Progress Notes (Signed)
 Diabetes Self-Management Education  Visit Type:  Follow-up  Appt. Start Time: 1355 Appt. End Time: 1441  11/18/2023  [name], identified by name and date of birth, is a 68 y.o. male with a diagnosis of Diabetes:  .    ASSESSMENT Patient arrived today alone. Pt reports d/c rybelsus  about a 1 month ago. Pt reports administering 12 units tresiba  takes near noon daily. Pt reports he continues to use CGM and states this morning fasting blood sugar of 139 mg/dL today using CGM. Pt reports his CGM alarm about 102 monthly with values less than 70 mg/dL-RD reviewed the rule of 15 with Pt today.  Pt reports he has a member ship to [employer] yet Pt desires to join [employer] and states he plans to discuss options with insurance company. Pt desires body weight increases. Pt reports his appetite is ok with typical intake of 2-3 meals with a snack daily. Patient lives alone and continues to he does the shopping and cooking. Pt reports walking the dog twice daily for 15 minutes. All Pt's questions were answered during this encounter.  Lab Results  Component Value Date   HGBA1C 5.5 11/09/2023   Lab Results  Component Value Date   CHOL 110 04/01/2023   HDL 55 04/01/2023   LDLCALC 38 04/01/2023   LDLDIRECT 39 08/17/2023   TRIG 88 04/01/2023   CHOLHDL 2.0 04/01/2023   Wt Readings from Last 3 Encounters:  11/18/23 139 lb (63 kg)  11/09/23 135 lb (61.2 kg)  11/09/23 135 lb (61.2 kg)    Diabetes Self-Management Education - 11/18/23 1344       Pre-Education Assessment   Patient understands the diabetes disease and treatment process. Comprehends key points    Patient understands incorporating nutritional management into lifestyle. Needs Review      Complications   Last HgB A1C per patient/outside source 5.5 %    Number of hypoglycemic episodes per month --   rare   Number of hyperglycemic episodes ( >200mg /dL): Occasional      Dietary Intake   Breakfast sausage, cofee, pancakes  or 3 canned biscuit    Lunch 1 can ravoli    Dinner lentils, chicken, spinach    Snack (evening) chocolate oreos    Beverage(s) water      Patient Education   Previous Diabetes Education Yes    Healthy Eating Plate Method;Role of diet in the treatment of diabetes and the relationship between the three main macronutrients and blood glucose level    Being Active Role of exercise on diabetes management, blood pressure control and cardiac health.    Medications Reviewed patients medication for diabetes, action, purpose, timing of dose and side effects.    Monitoring Daily foot exams;Identified appropriate SMBG and/or A1C goals.;Taught/evaluated CGM (comment)    Acute complications Taught prevention, symptoms, and  treatment of hypoglycemia - the 15 rule.    Chronic complications Relationship between chronic complications and blood glucose control;Assessed and discussed foot care and prevention of foot problems;Identified and discussed with patient  current chronic complications    Diabetes Stress and Support Worked with patient to identify barriers to care and solutions      Individualized Goals (developed by patient)   Nutrition Follow meal plan discussed    Physical Activity 30 minutes per day    Medications take my medication as prescribed    Monitoring  Consistenly use CGM    Problem Solving Eating Pattern    Reducing Risk do foot checks daily  Health Coping Ask for help with psychological, social, or emotional issues      Patient Self-Evaluation of Goals - Patient rates self as meeting previously set goals (% of time)   Nutrition 25 - 50% (sometimes)    Physical Activity < 25% (hardly ever/never)    Medications >75% (most of the time)    Monitoring >75% (most of the time)    Problem Solving and behavior change strategies  50 - 75 % (half of the time)    Reducing Risk (treating acute and chronic complications) 50 - 75 % (half of the time)    Health Coping 50 - 75 % (half of the  time)      Post-Education Assessment   Patient understands the diabetes disease and treatment process. Comprehends key points    Patient understands incorporating nutritional management into lifestyle. Needs Review    Patient undertands incorporating physical activity into lifestyle. Needs Review    Patient understands using medications safely. Comphrehends key points    Patient understands monitoring blood glucose, interpreting and using results Comprehends key points    Patient understands prevention, detection, and treatment of acute complications. Needs Review    Patient understands prevention, detection, and treatment of chronic complications. Comprehends key points    Patient understands how to develop strategies to address psychosocial issues. Needs Review    Patient understands how to develop strategies to promote health/change behavior. Needs Review      Outcomes   Program Status Not Completed      Subsequent Visit   Since your last visit have you continued or begun to take your medications as prescribed? Yes    Since your last visit have you experienced any weight changes? Gain    Weight Gain (lbs) 4    Since your last visit, are you checking your blood glucose at least once a day? Yes          Learning Objective:  Patient will have a greater understanding of diabetes self-management. Patient education plan is to attend individual and/or group sessions per assessed needs and concerns.   Plan:   Patient Instructions   1-Aim for balanced meals and snacks 2- Increase snacks to TID   Expected Outcomes:  Demonstrated interest in learning. Expect positive outcomes  Education material provided: My Plate and Snack sheet High Calories, High Protein Recipes  If problems or questions, patient to contact team via:  Phone  Future DSME appointment: - 3-4 months

## 2023-11-09 ENCOUNTER — Ambulatory Visit (INDEPENDENT_AMBULATORY_CARE_PROVIDER_SITE_OTHER): Payer: Self-pay

## 2023-11-09 ENCOUNTER — Ambulatory Visit (INDEPENDENT_AMBULATORY_CARE_PROVIDER_SITE_OTHER): Payer: Self-pay | Admitting: Nurse Practitioner

## 2023-11-09 ENCOUNTER — Encounter: Payer: Self-pay | Admitting: Nurse Practitioner

## 2023-11-09 ENCOUNTER — Other Ambulatory Visit: Payer: Self-pay

## 2023-11-09 VITALS — BP 132/68 | HR 75 | Wt 135.0 lb

## 2023-11-09 VITALS — BP 132/68 | HR 75 | Ht 65.0 in | Wt 135.0 lb

## 2023-11-09 DIAGNOSIS — Z23 Encounter for immunization: Secondary | ICD-10-CM

## 2023-11-09 DIAGNOSIS — E876 Hypokalemia: Secondary | ICD-10-CM | POA: Diagnosis not present

## 2023-11-09 DIAGNOSIS — E1169 Type 2 diabetes mellitus with other specified complication: Secondary | ICD-10-CM

## 2023-11-09 DIAGNOSIS — E785 Hyperlipidemia, unspecified: Secondary | ICD-10-CM | POA: Diagnosis not present

## 2023-11-09 DIAGNOSIS — Z0000 Encounter for general adult medical examination without abnormal findings: Secondary | ICD-10-CM | POA: Diagnosis not present

## 2023-11-09 DIAGNOSIS — Z8673 Personal history of transient ischemic attack (TIA), and cerebral infarction without residual deficits: Secondary | ICD-10-CM | POA: Diagnosis not present

## 2023-11-09 DIAGNOSIS — I10 Essential (primary) hypertension: Secondary | ICD-10-CM | POA: Diagnosis not present

## 2023-11-09 LAB — POCT GLYCOSYLATED HEMOGLOBIN (HGB A1C): HbA1c, POC (controlled diabetic range): 5.5 % (ref 0.0–7.0)

## 2023-11-09 MED ORDER — NIFEDIPINE ER OSMOTIC RELEASE 60 MG PO TB24
60.0000 mg | ORAL_TABLET | Freq: Two times a day (BID) | ORAL | 2 refills | Status: AC
  Filled 2023-11-09 – 2024-01-09 (×2): qty 180, 90d supply, fill #0

## 2023-11-09 MED ORDER — OLMESARTAN MEDOXOMIL-HCTZ 40-25 MG PO TABS
1.0000 | ORAL_TABLET | Freq: Every day | ORAL | 2 refills | Status: DC
  Filled 2023-11-09: qty 90, 90d supply, fill #0

## 2023-11-09 MED ORDER — CLOPIDOGREL BISULFATE 75 MG PO TABS
75.0000 mg | ORAL_TABLET | Freq: Every day | ORAL | 3 refills | Status: AC
  Filled 2023-11-09 – 2024-01-29 (×2): qty 90, 90d supply, fill #0

## 2023-11-09 NOTE — Assessment & Plan Note (Signed)
 Lab Results  Component Value Date   NA 142 08/17/2023   K 3.1 (L) 08/17/2023   CO2 25 08/17/2023   GLUCOSE 183 (H) 08/17/2023   BUN 15 08/17/2023   CREATININE 1.02 08/17/2023   CALCIUM  9.8 08/17/2023   EGFR 82 08/17/2023   GFRNONAA >60 04/15/2021   Previous low potassium levels noted. He is not taking prescribed potassium supplements due to pill size, instead consuming bananas and blending pills into shakes. - Perform lab work to monitor potassium levels. - Encourage dietary potassium intake.

## 2023-11-09 NOTE — Assessment & Plan Note (Addendum)
 SABRA

## 2023-11-09 NOTE — Assessment & Plan Note (Signed)
-   Encourage  shingles vaccine, Tdap vaccine, and pneumonia vaccine at the pharmacy.

## 2023-11-09 NOTE — Assessment & Plan Note (Signed)
Continue Plavix 75 mg daily  

## 2023-11-09 NOTE — Progress Notes (Signed)
 Established Patient Office Visit  Subjective:  Patient ID: Darrell [name], male    DOB: 05-Sep-1955  Age: 68 y.o. MRN: [id number]  CC:  Chief Complaint  Patient presents with   Medical Management of Chronic Issues    HPI   Discussed the use of AI scribe software for clinical note transcription with the patient, who gave verbal consent to proceed.  History of Present Illness Darrell [name] is a 68 year old male  has a past medical history of Acute blood loss anemia (04/14/2021), Benign neoplasm of sigmoid colon, Benign prostatic hyperplasia without lower urinary tract symptoms (06/08/2017), Diverticulosis of colon with hemorrhage, Dyslipidemia, Esophageal stricture, Essential hypertension, benign, Expressive aphasia (07/14/2018), Gastroesophageal reflux disease with esophagitis without hemorrhage, History of TIA (transient ischemic attack) (04/14/2021), Hyperlipidemia, Hypertension (01/30/2015), Hypertensive emergency (07/14/2018), Prolonged QT interval (07/14/2018), RBBB, Statin intolerance (08/27/2022), 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 who presents for a follow-up visit.  He stopped taking Rybelsus  a week ago due to nausea and vomiting. His current diabetes management includes metformin  1000 mg twice daily and basaglar  10 units, ,  had a visit with the clinical pharmacist this afternoon , he was given  instructions to stop Rybelsus  and continue metformin  1000 mg twice daily, increase to 12 units if fasting blood sugar remains consistently over 130 mg/dL. His A1c is well-controlled at 5.5%.  For hypertension, he is on Procardia  60 mg twice daily and Benicar - hydrochlorothiazide  40/25 mg once daily. He has a history of low potassium levels and has been consuming more bananas and using the potassium pills  that was prescribed in July( was prescribed 4 tablets to be taken 40mg  daily for 2 days )  in shakes to manage this.  He continues to take Repatha  every 14 days and Plavix  75 mg daily. He has not been exercising regularly due to transportation issues but walks occasionally. He received a flu vaccine today but has not yet received the shingles, Tdap, or pneumonia vaccines. He has not seen an eye doctor in the past year.  No chest pain, shortness of breath, or dizziness.  Assessment & Plan    Past Medical History:  Diagnosis Date   Acute blood loss anemia 04/14/2021   Benign neoplasm of sigmoid colon    Benign prostatic hyperplasia without lower urinary tract symptoms 06/08/2017   Diverticulosis of colon with hemorrhage    Dyslipidemia    Esophageal stricture    Essential hypertension, benign    Expressive aphasia 07/14/2018   Gastroesophageal reflux disease with esophagitis without hemorrhage    History of TIA (transient ischemic attack) 04/14/2021   Hyperlipidemia    Hypertension 01/30/2015   Hypertensive emergency 07/14/2018   Prolonged QT interval 07/14/2018   RBBB    Statin intolerance 08/27/2022   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, 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] 09/24/2022   Thyroid  cyst    TIA (transient ischemic attack) 07/14/2018   Type 2 diabetes mellitus (HCC)    Type 2 diabetes mellitus with hyperlipidemia (HCC) 01/30/2015   Vitamin D  deficiency     Past Surgical History:  Procedure Laterality Date   COLONOSCOPY WITH PROPOFOL  N/A 04/15/2021   Procedure: COLONOSCOPY WITH PROPOFOL ;  Surgeon: Abran Norleen SAILOR, MD;  Location: [facility];  Service: Gastroenterology;  Laterality: N/A;   ESOPHAGOGASTRODUODENOSCOPY (EGD) WITH PROPOFOL  N/A 04/15/2021   Procedure: ESOPHAGOGASTRODUODENOSCOPY (EGD) WITH PROPOFOL ;  Surgeon: Abran Norleen SAILOR, MD;  Location: [facility];  Service: Gastroenterology;  Laterality: N/A;   MELANOMA EXCISION     Back   POLYPECTOMY  04/15/2021   Procedure: POLYPECTOMY;  Surgeon: Abran Norleen SAILOR, MD;  Location: [facility];  Service: Gastroenterology;;   WISDOM TOOTH EXTRACTION      Family History  Problem Relation Age of Onset   Hypertension Mother    Pulmonary embolism Mother    Diabetes Father    Hypertension Father    Alzheimer's disease Father    Heart disease Brother    Alzheimer's disease Brother    Diabetes Maternal Grandmother    Colon cancer Neg Hx     Social History   Socioeconomic History   Marital status: Single    Spouse name: Not on file   Number of children: 4   Years of education: Not on file   Highest education level: Not on file  Occupational History    Employer: [employer]  Tobacco Use   Smoking status: Never    Passive exposure: Never   Smokeless tobacco: Never  Vaping Use   Vaping status: Never Used  Substance and Sexual Activity   Alcohol use: Yes    Comment: occasional   Drug use: No   Sexual activity: Not on file  Other Topics Concern   Not on file  Social History Narrative   Lives home alone   Social Drivers of Health   Financial Resource Strain: Not on file  Food Insecurity: No Food Insecurity (08/19/2023)   Hunger Vital Sign    Worried About Running Out of Food in the Last Year: Never true    Ran Out of Food in the Last Year: Never true  Transportation Needs: No Transportation Needs (10/12/2022)   PRAPARE - Administrator, Civil Service (Medical): No    Lack of Transportation (Non-Medical): No  Physical Activity: Inactive (10/12/2022)   Exercise Vital Sign    Days of Exercise per Week: 0 days    Minutes of Exercise per Session: 0 min  Stress: Not on file  Social Connections: Not on file  Intimate Partner Violence: Not on file    Outpatient Medications Prior to Visit  Medication Sig Dispense Refill   insulin  degludec (TRESIBA  FLEXTOUCH) 100 UNIT/ML FlexTouch Pen Inject 10 Units into the skin daily. 3 mL 5   metFORMIN  (GLUCOPHAGE -XR) 500 MG 24 hr tablet Take 2 tablets (1,000 mg total) by mouth 2 (two)  times daily with a meal. 360 tablet 3   NIFEdipine  (PROCARDIA  XL/NIFEDICAL XL) 60 MG 24 hr tablet Take 1 tablet (60 mg total) by mouth in the morning and at bedtime. 180 tablet 1   olmesartan -hydrochlorothiazide  (BENICAR  HCT) 40-25 MG tablet Take 1 tablet by mouth daily. 90 tablet 1   Blood Glucose Monitoring Suppl (ACCU-CHEK AVIVA PLUS) w/Device KIT Use as directed to check blood sugar 1 kit 0   Continuous Glucose Receiver (FREESTYLE LIBRE 3 READER) DEVI Use to check blood sugar continuously 1 each 0   Continuous Glucose Sensor (FREESTYLE LIBRE 3 PLUS SENSOR) MISC Change sensor every 15 days. 6 each 3   Evolocumab  (REPATHA  SURECLICK) 140 MG/ML SOAJ Inject 140 mg into the skin every 14 (fourteen) days. 6 mL 2   Glucose Blood (BLOOD GLUCOSE TEST STRIPS) STRP Use to check blood sugar twice daily. May substitute to any manufacturer covered by patient's insurance. (Patient not taking: Reported on 08/17/2023) 100 strip 3   Insulin  Pen Needle (PEN NEEDLES) 32G X 6 MM MISC Use as  directed to inject insulin  once daily 200 each 3   Lancet Device MISC Use to check blood sugar twice daily. May substitute to any manufacturer covered by patient's insurance. 1 each 0   Multiple Vitamin (MULTIVITAMIN) tablet Take 1 tablet by mouth daily.     OneTouch Delica Lancets 30G MISC Use to test blood sugar as directed 100 each 3   potassium chloride  SA (KLOR-CON  M) 20 MEQ tablet Take 2 tablets (40 mEq total) by mouth daily. (Patient not taking: Reported on 11/09/2023) 4 tablet 0   Semaglutide  (RYBELSUS ) 14 MG TABS Take 1 tablet (14 mg total) by mouth daily. (Patient not taking: Reported on 11/09/2023) 90 tablet 3   Semaglutide  (RYBELSUS ) 14 MG TABS Take 1 tablet (14 mg total) by mouth daily. (Patient not taking: Reported on 11/09/2023) 90 tablet 1   clopidogrel  (PLAVIX ) 75 MG tablet Take 1 tablet (75 mg total) by mouth daily. 90 tablet 3   No facility-administered medications prior to visit.    Allergies  Allergen  Reactions   Statins Other (See Comments)    Joint Pain    ROS Review of Systems  Constitutional:  Negative for appetite change, chills, fatigue and fever.  HENT:  Negative for congestion, postnasal drip, rhinorrhea and sneezing.   Respiratory:  Negative for cough, shortness of breath and wheezing.   Cardiovascular:  Negative for chest pain, palpitations and leg swelling.  Gastrointestinal:  Negative for abdominal pain, constipation, nausea and vomiting.  Genitourinary:  Negative for difficulty urinating, dysuria, flank pain and frequency.  Musculoskeletal:  Negative for arthralgias, back pain, joint swelling and myalgias.  Skin:  Negative for color change, pallor, rash and wound.  Neurological:  Negative for dizziness, facial asymmetry, weakness, numbness and headaches.  Psychiatric/Behavioral:  Negative for behavioral problems, confusion, self-injury and suicidal ideas.       Objective:    Physical Exam Vitals and nursing note reviewed.  Constitutional:      General: He is not in acute distress.    Appearance: Normal appearance. He is not ill-appearing, toxic-appearing or diaphoretic.  Eyes:     General: No scleral icterus.       Right eye: No discharge.        Left eye: No discharge.     Extraocular Movements: Extraocular movements intact.     Conjunctiva/sclera: Conjunctivae normal.  Cardiovascular:     Rate and Rhythm: Normal rate and regular rhythm.     Pulses: Normal pulses.     Heart sounds: Normal heart sounds. No murmur heard.    No friction rub. No gallop.  Pulmonary:     Effort: Pulmonary effort is normal. No respiratory distress.     Breath sounds: Normal breath sounds. No stridor. No wheezing, rhonchi or rales.  Chest:     Chest wall: No tenderness.  Abdominal:     General: There is no distension.     Palpations: Abdomen is soft.     Tenderness: There is no abdominal tenderness. There is no right CVA tenderness, left CVA tenderness or guarding.   Musculoskeletal:        General: No swelling, tenderness, deformity or signs of injury.     Right lower leg: No edema.     Left lower leg: No edema.  Skin:    General: Skin is warm and dry.     Capillary Refill: Capillary refill takes less than 2 seconds.     Coloration: Skin is not jaundiced or pale.     Findings:  No bruising, erythema or lesion.  Neurological:     Mental Status: He is alert and oriented to person, place, and time.     Motor: No weakness.     Gait: Gait normal.  Psychiatric:        Mood and Affect: Mood normal.        Behavior: Behavior normal.        Thought Content: Thought content normal.        Judgment: Judgment normal.     BP 132/68   Pulse 75   Ht 5' 5 (1.651 m)   Wt 135 lb (61.2 kg)   SpO2 98%   BMI 22.47 kg/m  Wt Readings from Last 3 Encounters:  11/09/23 135 lb (61.2 kg)  11/09/23 135 lb (61.2 kg)  08/17/23 135 lb (61.2 kg)    Lab Results  Component Value Date   TSH 1.310 04/07/2022   Lab Results  Component Value Date   WBC 7.2 06/29/2023   HGB 13.4 06/29/2023   HCT 40.2 06/29/2023   MCV 97 06/29/2023   PLT 213 06/29/2023   Lab Results  Component Value Date   NA 142 08/17/2023   K 3.1 (L) 08/17/2023   CO2 25 08/17/2023   GLUCOSE 183 (H) 08/17/2023   BUN 15 08/17/2023   CREATININE 1.02 08/17/2023   BILITOT 0.6 08/27/2022   ALKPHOS 56 08/27/2022   AST 12 08/27/2022   ALT 10 08/27/2022   PROT 6.7 08/27/2022   ALBUMIN 4.5 08/27/2022   CALCIUM  9.8 08/17/2023   ANIONGAP 9 04/15/2021   EGFR 82 08/17/2023   Lab Results  Component Value Date   CHOL 110 04/01/2023   Lab Results  Component Value Date   HDL 55 04/01/2023   Lab Results  Component Value Date   LDLCALC 38 04/01/2023   Lab Results  Component Value Date   TRIG 88 04/01/2023   Lab Results  Component Value Date   CHOLHDL 2.0 04/01/2023   Lab Results  Component Value Date   HGBA1C 5.5 11/09/2023      Assessment & Plan:   Problem List Items  Addressed This Visit       Cardiovascular and Mediastinum   Hypertension   BP Readings from Last 3 Encounters:  11/09/23 132/68  11/09/23 132/68  08/17/23 119/73   Hypertension is well-controlled with current medication regimen. - Continue Procardia  60 mg twice daily. - Continue Benicar  HCT 40-25 mg daily. - Encourage adherence to a heart-healthy, low-salt diet. Encouraged moderate exercise 30 minutes 5 days weekly as tolerated       Relevant Medications   NIFEdipine  (PROCARDIA  XL/NIFEDICAL XL) 60 MG 24 hr tablet   olmesartan -hydrochlorothiazide  (BENICAR  HCT) 40-25 MG tablet     Endocrine   Type 2 diabetes mellitus with hyperlipidemia (HCC) - Primary   Lab Results  Component Value Date   HGBA1C 5.5 11/09/2023   Type 2 diabetes mellitus with good glycemic control, A1c at 5.5%. Discontinued Rybelsus  due to nausea and vomiting. - Adjust insulin  dosage based on blood glucose levels. - Increase insulin  to 12 units if fasting blood sugar consistently exceeds 130 mg/dL. - Continue metformin  1000 mg twice daily. - Encourage adherence to a heart-healthy, low-salt, low-fat diet. - Encourage regular exercise, 30 minutes, five days a week. - Refer to an ophthalmologist for annual diabetic eye exam. Appreciates collaboration with the clinical pharmacist   Hyperlipidemia managed with Repatha  - Continue Repatha  140 mg/mL subcutaneously every 14 days. - Continue Plavix  75 mg daily.  Relevant Medications   NIFEdipine  (PROCARDIA  XL/NIFEDICAL XL) 60 MG 24 hr tablet   olmesartan -hydrochlorothiazide  (BENICAR  HCT) 40-25 MG tablet   Other Relevant Orders   POCT A1C (Completed)   Basic Metabolic Panel     Other   Hypokalemia   Lab Results  Component Value Date   NA 142 08/17/2023   K 3.1 (L) 08/17/2023   CO2 25 08/17/2023   GLUCOSE 183 (H) 08/17/2023   BUN 15 08/17/2023   CREATININE 1.02 08/17/2023   CALCIUM  9.8 08/17/2023   EGFR 82 08/17/2023   GFRNONAA >60 04/15/2021    Previous low potassium levels noted. He is not taking prescribed potassium supplements due to pill size, instead consuming bananas and blending pills into shakes. - Perform lab work to monitor potassium levels. - Encourage dietary potassium intake.       History of TIA (transient ischemic attack)   Continue Plavix  75 mg daily      Relevant Medications   clopidogrel  (PLAVIX ) 75 MG tablet   Health care maintenance    - Encourage  shingles vaccine, Tdap vaccine, and pneumonia vaccine at the pharmacy.          Meds ordered this encounter  Medications   NIFEdipine  (PROCARDIA  XL/NIFEDICAL XL) 60 MG 24 hr tablet    Sig: Take 1 tablet (60 mg total) by mouth in the morning and at bedtime.    Dispense:  180 tablet    Refill:  2   olmesartan -hydrochlorothiazide  (BENICAR  HCT) 40-25 MG tablet    Sig: Take 1 tablet by mouth daily.    Dispense:  90 tablet    Refill:  2    For mail order   clopidogrel  (PLAVIX ) 75 MG tablet    Sig: Take 1 tablet (75 mg total) by mouth daily.    Dispense:  90 tablet    Refill:  3    For mail order    Follow-up: Return in about 3 months (around 02/09/2024) for DM, HTN.    Witten Certain R Maira Christon, FNP

## 2023-11-09 NOTE — Assessment & Plan Note (Signed)
 BP Readings from Last 3 Encounters:  11/09/23 132/68  11/09/23 132/68  08/17/23 119/73   Hypertension is well-controlled with current medication regimen. - Continue Procardia  60 mg twice daily. - Continue Benicar  HCT 40-25 mg daily. - Encourage adherence to a heart-healthy, low-salt diet. Encouraged moderate exercise 30 minutes 5 days weekly as tolerated

## 2023-11-09 NOTE — Progress Notes (Signed)
 11/09/2023 Name: Darrell [name] MRN: [id number] DOB: 10/23/1955  Chief Complaint  Patient presents with   Diabetes   Hypertension    Darrell [name] is a 68 y.o. year old male who was referred for medication management by their primary care provider, Paseda, Folashade R, FNP. They presented for a face to face visit today. He also had an appt with his PCP scheduled.   They were referred to the pharmacist by their PCP for assistance in managing diabetes  PMH includes HTN, TIA (2020), T2DM, HLD with statin myopathy, BPH.   Subjective: Patient was last seen by PCP, Darrell Shall, NP, on 08/17/23. At last visit, patient reported taking Rybelsus  and metformin  as prescribed, but had not received Lantus  from the pharmacy. I consulted with the patient in the room and assisted in facilitating shipment of Repatha  ($0 via [employer]) and Lantus  ($18.12 for 30ds on an [employer] account). We then submitted an application for him to receive Tresiba , along with Rybelsus  from [employer] PAP. At pharmacy follow-up on 09/14/23, patient reported he had been taking his medications as prescribed. He had 2-3 days remaining of Lantus . TIR was 84%. Collaborated with [facility] pharmacy to fill Tresiba  while awaiting Novo Nordisk shipment. Shipment has since been received by [facility] pharmacy  Today, patient presents in  good spirits and presents without  any assistance. He reports that he has been upset by continued weight loss. He reports he stopped taking Rybelsus  14 mg about one week ago because he was waking up in the night with vomiting. He does not want to restart at a lower dose due to concern for weight loss. He has continued to monitor his sugars and since discontinuing Rybelsus , TIR has remained ~80%. He also was having frequent hypoglycemic events before discontinuing Rybelsus .   Care Team: Primary Care Provider: Paseda, Folashade R, FNP ; Next Scheduled Visit: needs to be scheduled   Medication Access/Adherence  Current  Pharmacy:  Darrell [name] - [facility] [address] [place] [address] Phone: [phone] Fax: [phone]   Patient reports affordability concerns with their medications: Yes  Patient reports access/transportation concerns to their pharmacy: Yes  - typically gets medications mailed to him Patient reports adherence concerns with their medications:  No  - has had issues with adherence in the past, but currently appears adherent. Brought his medications with him for review today.    Diabetes:  Current medications: metformin  IR 1000 mg BID, Rybelsus  14 mg daily (stopped taking one week ago due to weight loss and vomiting), Tresiba  (insulin  degludec) 10 units daily  Reports that vomiting has resolved since discontinuing Rybelsus .  Current glucose readings:  Using FL3 meter and reader; testing continuously  Date of Download: 11/09/23 - 7 day report (since discontinuing Rybelsus )    Patient denies hypoglycemic s/sx including dizziness, shakiness, sweating since discontinuing Rybelsus . Patient denies hyperglycemic symptoms including polyuria, polydipsia, polyphagia, nocturia, neuropathy, blurred vision.  Current meal patterns: Trying eat 3 meals/day, but appetite is on the lower side. May only eat half a meal at a time. Reports meals consists of fruits (cherries, grapes) and vegetables (carrots, vegetables). For protein - eats black beans, pinto beans.  - Drinks: water, sparking water, occasional diet drink  Current physical activity: walks his dog, chair exercises at home  Current medication access support:  - Novo Nordisk PAP - Rybelsus , Tresiba  (awaiting shipment) - expires 02/01/24 (Plan to re-enroll at appt in November, may need to screen for LIS)  Hyperlipidemia/ASCVD  Risk Reduction  Current lipid lowering medications: Repatha  140 mg subcutaneous every 14 days Medications tried in the past: statins (myopathy)  Antiplatelet regimen: clopidogrel  75 mg  daily  ASCVD History: TIA  Risk Factors: T2DM, HTN, HLD  Current medication access support:  - Repatha  - [employer]  - expires 11/27/23  Clinical ASCVD: Yes  The ASCVD Risk score (Arnett DK, et al., 2019) failed to calculate for the following reasons:   Risk score cannot be calculated because patient has a medical history suggesting prior/existing ASCVD    Objective:  BP Readings from Last 3 Encounters:  11/09/23 132/68  11/09/23 132/68  08/17/23 119/73    Lab Results  Component Value Date   HGBA1C 5.5 11/09/2023   HGBA1C 10.7 (A) 06/29/2023   HGBA1C 9.2 (A) 04/01/2023       Latest Ref Rng & Units 08/17/2023    3:48 PM 04/01/2023   11:49 AM 12/28/2022    3:29 PM  BMP  Glucose 70 - 99 mg/dL 816  729  837   BUN 8 - 27 mg/dL 15  13  12    Creatinine 0.76 - 1.27 mg/dL 8.97  9.17  9.13   BUN/Creat Ratio 10 - 24 15  16  14    Sodium 134 - 144 mmol/L 142  144  147   Potassium 3.5 - 5.2 mmol/L 3.1  3.8  3.6   Chloride 96 - 106 mmol/L 98  103  102   CO2 20 - 29 mmol/L 25  26  26    Calcium  8.6 - 10.2 mg/dL 9.8  9.6  89.8     Lab Results  Component Value Date   CHOL 110 04/01/2023   HDL 55 04/01/2023   LDLCALC 38 04/01/2023   LDLDIRECT 39 08/17/2023   TRIG 88 04/01/2023   CHOLHDL 2.0 04/01/2023    Medications Reviewed Today   Medications were not reviewed in this encounter       Assessment/Plan:   Diabetes: - Currently controlled with A1C today of 5.5% below goal < 7%, and improved from 10.7%. Low A1C is consistent with patient-reported hypoglycemia, that is also reflected on his CGM report (TBR of ~1% over the past for weeks). Agree with discontinuation of Rybelsus  due to vomiting and weight loss. Will continue to monitor sugar closely to guide whether further titration of insulin  is needed. Congratulated on improvement! - Last UACR Feb 2025 - 23 mg/g - Patient denies personal or family history of multiple endocrine neoplasia type 2, medullary thyroid  cancer;  personal history of pancreatitis or gallbladder disease. - Reviewed [name] term cardiovascular and renal outcomes of uncontrolled blood sugar - Reviewed goal A1c, goal fasting, and goal 2 hour post prandial glucose - Reviewed hypoglycemia management plan and the rule of 15 - Reviewed dietary modifications including  utilizing the healthy plate method, limiting portion size of carbohydrate foods, increasing intake of protein and non-starchy vegetables. Counseled patient to stay hydrated with water throughout the day. - Reviewed lifestyle modifications including: aiming for 150 minutes of moderate intensity exercise every week.  - Recommend to stop Rybelsus . Rybelsus  is also being removed from [employer] PAP program in 2026, so this would not have been available to the patient next year. - Recommend to continue insulin  degludec (Tresiba ) 10 units daily via patient assistance. Will need to re-enroll and/or screen for LIS at follow-up.  - Recommend to continue metformin  XR 1000 mg BID  - Recommend to check glucose continuously with FL3 CGM and reader. Patient not interested  in connecting to his phone at this time. Counseled patient to bring glucometer or BG log to every appointment. - Next A1C due 02/09/23    Hyperlipidemia/ASCVD Risk Reduction: - Currently controlled with most recent LDL-C of 39 mg/dL below goal < 70 mg/dL given hx of TIA + U7IF. Patient tolerating Repatha  well. Pharmacy filling for 3 mo at a time.  - Reviewed [name] term complications of uncontrolled cholesterol - Recommend to continue Repatha  140 mg Gerty every 14 days   Written patient instructions provided. Patient verbalized understanding of treatment plan.   Follow Up Plan:  Pharmacist 12/21/23 PCP clinic visit needs to be scheduled   Lorain Baseman, PharmD [facility] [phone]

## 2023-11-09 NOTE — Assessment & Plan Note (Addendum)
 Lab Results  Component Value Date   HGBA1C 5.5 11/09/2023   Type 2 diabetes mellitus with good glycemic control, A1c at 5.5%. Discontinued Rybelsus  due to nausea and vomiting. - Adjust insulin  dosage based on blood glucose levels. - Increase insulin  to 12 units if fasting blood sugar consistently exceeds 130 mg/dL. - Continue metformin  1000 mg twice daily. - Encourage adherence to a heart-healthy, low-salt, low-fat diet. - Encourage regular exercise, 30 minutes, five days a week. - Refer to an ophthalmologist for annual diabetic eye exam. Appreciates collaboration with the clinical pharmacist   Hyperlipidemia managed with Repatha  - Continue Repatha  140 mg/mL subcutaneously every 14 days. - Continue Plavix  75 mg daily.

## 2023-11-09 NOTE — Patient Instructions (Addendum)
 Goal for fasting blood sugar ranges from 80 to 120 and 2 hours after any meal or at bedtime should be between 130 to 170.   Over the next few weeks if you fasting blood sugar stays consistently greater than 130 please increase Tresibe to 12 units daily    It is important that you exercise regularly at least 30 minutes 5 times a week as tolerated  Think about what you will eat, plan ahead. Choose  clean, green, fresh or frozen over canned, processed or packaged foods which are more sugary, salty and fatty. 70 to 75% of food eaten should be vegetables and fruit. Three meals at set times with snacks allowed between meals, but they must be fruit or vegetables. Aim to eat over a 12 hour period , example 7 am to 7 pm, and STOP after  your last meal of the day. Drink water,generally about 64 ounces per day, no other drink is as healthy. Fruit juice is best enjoyed in a healthy way, by EATING the fruit.  Thanks for choosing [facility] we consider it a privelige to serve you.

## 2023-11-10 ENCOUNTER — Other Ambulatory Visit (HOSPITAL_COMMUNITY): Payer: Self-pay

## 2023-11-10 ENCOUNTER — Other Ambulatory Visit: Payer: Self-pay

## 2023-11-10 LAB — BASIC METABOLIC PANEL WITH GFR
BUN/Creatinine Ratio: 20 (ref 10–24)
BUN: 18 mg/dL (ref 8–27)
CO2: 22 mmol/L (ref 20–29)
Calcium: 9 mg/dL (ref 8.6–10.2)
Chloride: 102 mmol/L (ref 96–106)
Creatinine, Ser: 0.91 mg/dL (ref 0.76–1.27)
Glucose: 193 mg/dL — ABNORMAL HIGH (ref 70–99)
Potassium: 3.2 mmol/L — ABNORMAL LOW (ref 3.5–5.2)
Sodium: 143 mmol/L (ref 134–144)
eGFR: 93 mL/min/1.73 (ref >59)

## 2023-11-11 ENCOUNTER — Ambulatory Visit: Payer: Self-pay

## 2023-11-11 ENCOUNTER — Other Ambulatory Visit: Payer: Self-pay | Admitting: Nurse Practitioner

## 2023-11-11 DIAGNOSIS — I10 Essential (primary) hypertension: Secondary | ICD-10-CM

## 2023-11-11 DIAGNOSIS — E876 Hypokalemia: Secondary | ICD-10-CM

## 2023-11-11 MED ORDER — POTASSIUM CHLORIDE CRYS ER 10 MEQ PO TBCR
10.0000 meq | EXTENDED_RELEASE_TABLET | Freq: Every day | ORAL | 0 refills | Status: DC
  Filled 2023-11-11: qty 7, 7d supply, fill #0

## 2023-11-12 ENCOUNTER — Other Ambulatory Visit (HOSPITAL_COMMUNITY): Payer: Self-pay

## 2023-11-14 ENCOUNTER — Telehealth: Payer: Self-pay

## 2023-11-14 ENCOUNTER — Other Ambulatory Visit (HOSPITAL_COMMUNITY): Payer: Self-pay

## 2023-11-14 ENCOUNTER — Other Ambulatory Visit: Payer: Self-pay

## 2023-11-14 MED ORDER — OLMESARTAN MEDOXOMIL-HCTZ 40-12.5 MG PO TABS
1.0000 | ORAL_TABLET | Freq: Every day | ORAL | 3 refills | Status: AC
  Filled 2023-11-14: qty 90, 90d supply, fill #0
  Filled 2024-02-07: qty 90, 90d supply, fill #1

## 2023-11-14 NOTE — Progress Notes (Signed)
 Called patient to advise on low potassium on recent BMP. He was not aware of findings. Advised him to take potassium 10 mEq once daily x 7 days and counseled him that we are decreasing the dose of his olmesartan -hydrochlorothiazide  to 40-12.5 mg daily, which will hopefully help prevent hypokalemia in the future. Patient in agreement with plan. Will confirm that [facility] is able to mail out these medications.   Lorain Baseman, PharmD [facility] [phone]

## 2023-11-14 NOTE — Progress Notes (Signed)
 Darrell [name]                                          MRN: [id number]   11/14/2023   The VBCI Quality Team Specialist reviewed this patient medical record for the purposes of chart review for care gap closure. The following were reviewed: abstraction for care gap closure-glycemic status assessment.    VBCI Quality Team

## 2023-11-15 ENCOUNTER — Other Ambulatory Visit (HOSPITAL_COMMUNITY): Payer: Self-pay

## 2023-11-16 ENCOUNTER — Other Ambulatory Visit (HOSPITAL_COMMUNITY): Payer: Self-pay

## 2023-11-16 ENCOUNTER — Other Ambulatory Visit: Payer: Self-pay

## 2023-11-16 ENCOUNTER — Other Ambulatory Visit: Payer: Self-pay | Admitting: Nurse Practitioner

## 2023-11-16 DIAGNOSIS — E876 Hypokalemia: Secondary | ICD-10-CM

## 2023-11-16 MED ORDER — POTASSIUM CHLORIDE CRYS ER 10 MEQ PO TBCR
10.0000 meq | EXTENDED_RELEASE_TABLET | Freq: Every day | ORAL | 0 refills | Status: DC
  Filled 2023-11-16 – 2023-11-17 (×2): qty 7, 7d supply, fill #0

## 2023-11-17 ENCOUNTER — Other Ambulatory Visit: Payer: Self-pay

## 2023-11-18 ENCOUNTER — Encounter: Attending: Nurse Practitioner | Admitting: Dietician

## 2023-11-18 VITALS — Wt 139.0 lb

## 2023-11-18 DIAGNOSIS — E1169 Type 2 diabetes mellitus with other specified complication: Secondary | ICD-10-CM | POA: Diagnosis present

## 2023-11-18 DIAGNOSIS — E785 Hyperlipidemia, unspecified: Secondary | ICD-10-CM | POA: Diagnosis present

## 2023-11-18 NOTE — Patient Instructions (Signed)
  1-Aim for balanced meals and snacks 2- Increase snacks to TID

## 2023-11-22 ENCOUNTER — Other Ambulatory Visit (HOSPITAL_COMMUNITY): Payer: Self-pay

## 2023-11-22 ENCOUNTER — Other Ambulatory Visit: Payer: Self-pay

## 2023-11-22 ENCOUNTER — Other Ambulatory Visit: Payer: Self-pay | Admitting: Nurse Practitioner

## 2023-11-22 DIAGNOSIS — E876 Hypokalemia: Secondary | ICD-10-CM

## 2023-11-22 MED ORDER — POTASSIUM CHLORIDE CRYS ER 10 MEQ PO TBCR
10.0000 meq | EXTENDED_RELEASE_TABLET | Freq: Every day | ORAL | 0 refills | Status: DC
  Filled 2023-11-22: qty 7, 7d supply, fill #0

## 2023-11-22 NOTE — Telephone Encounter (Signed)
 Please advise [facility]

## 2023-11-29 ENCOUNTER — Other Ambulatory Visit: Payer: Self-pay | Admitting: Nurse Practitioner

## 2023-11-29 DIAGNOSIS — E876 Hypokalemia: Secondary | ICD-10-CM

## 2023-12-01 ENCOUNTER — Other Ambulatory Visit (HOSPITAL_COMMUNITY): Payer: Self-pay

## 2023-12-21 ENCOUNTER — Other Ambulatory Visit: Payer: Self-pay

## 2023-12-21 ENCOUNTER — Other Ambulatory Visit (HOSPITAL_COMMUNITY): Payer: Self-pay

## 2023-12-21 ENCOUNTER — Ambulatory Visit (INDEPENDENT_AMBULATORY_CARE_PROVIDER_SITE_OTHER): Payer: Self-pay

## 2023-12-21 VITALS — BP 128/68 | HR 68 | Wt 141.0 lb

## 2023-12-21 DIAGNOSIS — I10 Essential (primary) hypertension: Secondary | ICD-10-CM | POA: Diagnosis not present

## 2023-12-21 DIAGNOSIS — E782 Mixed hyperlipidemia: Secondary | ICD-10-CM

## 2023-12-21 MED ORDER — TRESIBA FLEXTOUCH 100 UNIT/ML ~~LOC~~ SOPN
16.0000 [IU] | PEN_INJECTOR | Freq: Every day | SUBCUTANEOUS | 5 refills | Status: DC
  Filled 2023-12-21 – 2023-12-23 (×2): qty 3, 18d supply, fill #0
  Filled 2023-12-23: qty 3, 10d supply, fill #0
  Filled 2023-12-26: qty 18, 56d supply, fill #0

## 2023-12-21 NOTE — Progress Notes (Signed)
 12/21/2023 Name: Darrell [name] MRN: [id number] DOB: 02/03/1955  Chief Complaint  Patient presents with   Diabetes    Darrell [name] is a 68 y.o. year old male who was referred for medication management by their primary care provider, Paseda, Folashade R, FNP. They presented for a face to face visit today. He also had an appt with his PCP scheduled.   They were referred to the pharmacist by their PCP for assistance in managing diabetes  PMH includes HTN, TIA (2020), T2DM, HLD with statin myopathy, BPH.   Subjective: Patient was last seen by PCP, Lorice Shall, NP, on 08/17/23. At last visit, patient reported taking Rybelsus  and metformin  as prescribed, but had not received Lantus  from the pharmacy. I consulted with the patient in the room and assisted in facilitating shipment of Repatha  ($0 via [employer]) and Lantus  ($18.12 for 30ds on an AR account). We then submitted an application for him to receive Tresiba , along with Rybelsus  from [employer] P[employer]. At pharmacy follow-up on 09/14/23, patient reported he had been taking his medications as prescribed. He had 2-3 days remaining of Lantus . TIR was 84%. Collaborated with [facility] pharmacy to fill Tresiba  while awaiting Novo Nordisk shipment. Shipment has since been received by [facility] pharmacy. At pharmacy appt on 11/09/23, patient reported that he discontinued Rybelsus  due to weight loss and waking up overnight with vomiting. He declined restarting at a lower dose. TIR had remained ~80% since discontinuing Rybelsus . Due to side effects, and inability to obtain Rybelsus  via [employer] next year, we discontinued this medication and continued metformin  and Tresiba . Patient was found to have recurrent hypokalemia, so we decreased olmesartan -hydrochlorothiazide  to 40-12.5 mg daily.   Today, patient presents in  good spirits and presents without  any assistance. Brings freestyle reader with him for review. BG currently is in the 300s, and patient reports he has not  eaten anything. Blood sugars have been consistently higher since stopping Rybelsus . He increased Tresiba  to 13 units daily. Has continued metformin . He is having some symptoms of hyperglycemia. He is unsure whether he is taking olmesartan -hydrochlorothiazide  with 25 mg of 12.5 mg of hydrochlorothiazide .    Care Team: Primary Care Provider: Paseda, Folashade R, FNP ; Next Scheduled Visit: needs to be scheduled ~ 02/09/24   Medication Access/Adherence  Current Pharmacy:  [facility] - [facility] [address] [place] [address] Phone: [phone] Fax: [phone]   Patient reports affordability concerns with their medications: Yes  Patient reports access/transportation concerns to their pharmacy: Yes  - typically gets medications mailed to him Patient reports adherence concerns with their medications:  No  - has had issues with adherence in the past, but currently appears adherent. Requests assistance in obtaining Repatha  refills. Per pharmacy [employer] grant expired. Re-enrolled today.   Diabetes:  Current medications: metformin  IR 1000 mg BID (reports taking 2 tablets in the morning and 1 at night), Tresiba  (insulin  degludec) 12 units daily (increased to 13 units daily)  Reports that vomiting has resolved since discontinuing Rybelsus .  Current glucose readings:  Using FL3 meter and reader; testing continuously  Date of Download: 12/21/23    Patient denies hypoglycemic s/sx including dizziness, shakiness, sweating since discontinuing Rybelsus . Patient endorses hyperglycemic symptoms including polyuria, polyphagia, polydipsia. Denies nocturia, neuropathy, blurred vision.  Current meal patterns: Trying eat 3 meals/day. Appetite has increased since stopping Rybelsus . Now eating his full meal  Reports meals consists of fruits (cherries, grapes) and vegetables (carrots, vegetables). For protein - eats  black beans, pinto beans.  - Drinks: water, sparking  water, occasional diet drink  Current physical activity: walks his dog, chair exercises at home  Current medication access support:  - Nov[employer] PAP - Rybelsus , Tresiba  - expires 02/01/24 - obtained signatures on forms today. Will also submit Medicare LIS re-enrollment given low income and updates to [facility] program.  Hypertension:  Current medications: olmesartan -hydrochlorothiazide  40-12.5 mg daily (reports he may still be taking combo tab with 25 mg hydrochlorothiazide ) Medications previously tried:   Patient has a validated, automated, upper arm home BP cuff Current blood pressure readings readings: recalls reading of 130s/90s yesterday  Patient denies hypotensive s/sx including dizziness, lightheadedness.  Patient denies hypertensive symptoms including headache, chest pain, shortness of breath   Hyperlipidemia/ASCVD Risk Reduction  Current lipid lowering medications: Repatha  140 mg subcutaneous every 14 days Medications tried in the past: statins (myopathy)  Antiplatelet regimen: clopidogrel  75 mg daily  ASCVD History: TIA  Risk Factors: T2DM, HTN, HLD  Current medication access support:  - Repatha  - [employer]  - expires 11/26/24  Clinical ASCVD: Yes  The ASCVD Risk score (Arnett DK, et al., 2019) failed to calculate for the following reasons:   Risk score cannot be calculated because patient has a medical history suggesting prior/existing ASCVD    Objective:  BP Readings from Last 3 Encounters:  12/21/23 128/68  11/09/23 132/68  11/09/23 132/68    Lab Results  Component Value Date   HGBA1C 5.5 11/09/2023   HGBA1C 10.7 (A) 06/29/2023   HGBA1C 9.2 (A) 04/01/2023       Latest Ref Rng & Units 11/09/2023    3:46 PM 08/17/2023    3:48 PM 04/01/2023   11:49 AM  BMP  Glucose 70 - 99 mg/dL 806  816  729   BUN 8 - 27 mg/dL 18  15  13    Creatinine 0.76 - 1.27 mg/dL 9.08  8.97  9.17   BUN/Creat Ratio 10 - 24 20  15  16    Sodium 134 - 144 mmol/L 143  142  144    Potassium 3.5 - 5.2 mmol/L 3.2  3.1  3.8   Chloride 96 - 106 mmol/L 102  98  103   CO2 20 - 29 mmol/L 22  25  26    Calcium  8.6 - 10.2 mg/dL 9.0  9.8  9.6     Lab Results  Component Value Date   CHOL 110 04/01/2023   HDL 55 04/01/2023   LDLCALC 38 04/01/2023   LDLDIRECT 39 08/17/2023   TRIG 88 04/01/2023   CHOLHDL 2.0 04/01/2023    Medications Reviewed Today   Medications were not reviewed in this encounter       Assessment/Plan:   Diabetes: - Currently uncontrolled with 2-week TIR of  32% below goal > 70% since discontinuing Rybelsus . Will titrate insulin  and metformin . Will initiate application for SGLT2i, as I expect he will need additional antihyperglycemic agents. GLP-1RA is not accessible via patient assistance programs for him at this time, and he also struggled with weight loss on Rybelsus . Weight is stable today.  - Last UACR Feb 2025 - 23 mg/g - Reviewed long term cardiovascular and renal outcomes of uncontrolled blood sugar - Reviewed goal A1c, goal fasting, and goal 2 hour post prandial glucose - Reviewed hypoglycemia management plan and the rule of 15 - Reviewed dietary modifications including  utilizing the healthy plate method, limiting portion size of carbohydrate foods, increasing intake of protein and non-starchy vegetables. Counseled patient to  stay hydrated with water throughout the day. - Reviewed lifestyle modifications including: aiming for 150 minutes of moderate intensity exercise every week.  - Recommend to INCREASE insulin  degludec (Tresiba ) 16 units daily. Increase by 2 units every 3 days for fasting sugar consistently above 130 mg/dL up to a maximum of 26 units daily.  - Recommend to continue metformin  XR 1000 mg BID - encouraged patient to take 2 tabs BID as prescribed - Initiated [employer] application for Farxiga 10 mg daily today. Provided counseling on SGLT2i including sick day rules.  - Recommend to check glucose continuously with FL3 CGM and  reader. Patient not interested in connecting to his phone at this time. Counseled patient to bring glucometer or BG log to every appointment. - Next A1C due 02/09/23   Hypertension: - Currently controlled with clinic BP consistently below goal less than 130/80. Patient is not having s/sx of hypo- or hyper-tension. Rechecking BMP today to evaluate resolution of hypokalemia. Encouraged patient to ensure he is taking medication with hydrochlorothiazide  12.5 mg daily to prevent recurrent hypokalemia. - Reviewed long term cardiovascular and renal outcomes of uncontrolled blood pressure - Reviewed appropriate blood pressure monitoring technique and reviewed goal blood pressure. Recommended to check home blood pressure and heart rate  once daily and keep a log to bring to upcoming appointments - Recommend to continue olmesartan -hydrochlorothiazide  40-12.5 mg daily - Obtained BMP today.     Hyperlipidemia/ASCVD Risk Reduction: - Currently controlled with most recent LDL-C of 39 mg/dL below goal < 70 mg/dL given hx of TIA + U7IF. Patient tolerating Repatha  well. Pharmacy filling for 3 mo at a time. Re-enrolled in [employer] today. - Reviewed long term complications of uncontrolled cholesterol - Recommend to continue Repatha  140 mg Otho every 14 days  - Collaborated with [facility] pharmacy to prepare for mail order   Written patient instructions provided. Patient verbalized understanding of treatment plan.   Initiated Medicare LIS application, but need additional information from patient. Will attempt to obtain via telephone. [place] # for application is : [id number].   Follow Up Plan:  Pharmacist 02/14/23 PCP clinic visit 01/17/24   Lorain Baseman, PharmD [facility] [phone]

## 2023-12-21 NOTE — Progress Notes (Signed)
  Re-enrolled in [facility] for hypercholesterolemia today. Enrolled through 11/26/24. Updated pharmacy card info in [facility].     Lorain Baseman, PharmD [facility] [phone]

## 2023-12-21 NOTE — Patient Instructions (Addendum)
 It was nice to see you today!  Your goal blood sugar is 80-130 mg/dL before eating and less than 180 mg/dL after eating. Monitor blood sugars at home and keep a log (glucometer or piece of paper) to bring with you to your next visit.  Medication Changes: Increase Tresiba  to 16 units daily. Every 3 days, increase by 2 units, if your sugar when you wake up is consistently above 130 mg/dL. Do not increase past 26 units before our next appointment.   Increase metformin  XR to 1000 mg (2 tablets) twice daily  We submitted an application today for Farxiga 10 mg (1 tablet) once daily.  Take this medication in the morning, as it can cause more frequent urination with more sugar in the urine. It may worsen risk for dehydration or genital infections. Focus on staying well hydrated and using good genital hygiene. Stop the medication and call our office if you develop any symptoms of genital infections, such as burning, itching, or pain while urinating or itching with redness that could be a yeast infection. If you have a day that you are vomiting or having diarrhea and you are very dehydrated, please hold this medication until you feel better.   At home make sure you are taking the lower dose of olmesartan -hydrochlorothiazide  40-12.5 mg daily. You can get rid of the 40-25 mg tablet. We decreased the dose of this due to your low potassium.  Continue all other medication the same.   If you experience symptoms of a low blood sugar (dizzy, shaky, sweaty) check your blood sugar. If it is less than 70 mg/dL, you should eat or drink 15 grams of fast-acting carbohydrates like 4 glucose tablets, 1/2 cup of regular soda, or 1/2 cup of juice. Recheck your blood sugar after 15 minutes. If the level is still below 70 mg/dL repeat the process. If this occurs frequently, you should notify your healthcare provider.   Lifestyle Recommendations:  Aim for 150 minutes of moderate intensity exercise every week. This is any  activity that elevates your heart rate and breathing rate, but still allows you to carry on a conversation. Exercising after meals can help prevent spikes in your blood sugar.  Diet Recommendations:   Try to eat 3 real meals using the healthy plate method and 1-2 snacks per day. Never go more than 4-5 hours while awake without eating. Eat breakfast within the first hour of getting up.     Carbohydrates include starch, sugar, and fiber. Sugar and starch raise blood glucose.  Starchy foods include bread, rice, pasta, potatoes, corn, cereal, grits, crackers, bagels, muffins, all baked foods Some fruits are higher in sugar, like grapes, watermelon, oranges, and most tropical fruits. Limit your serving size to 1/2 cup at a time.  Protein foods include meat, fish, poultry, eggs, dairy, and beans (although beans also provide carbohydrates).  Non-starchy vegetables do not impact your blood sugar very much. These include greens, broccoli, asparagus, carrots, cauliflower, cucumber, mushrooms, peppers, yellow squash, zucchini squash, tomato, to name a few!  Avoid all sugary beverages. Stay hydrated with water throughout the day. Sparkling/flavored water, unsweet tea, black coffee, and zero-sugar or diet drinks will not impact your blood sugar, but they should not be used as your only source of hydration!  You can find more information at https://diabetes.org/food-nutrition/eating-healthy   Lorain Baseman, PharmD [facility] [phone]

## 2023-12-22 ENCOUNTER — Other Ambulatory Visit: Payer: Self-pay

## 2023-12-22 ENCOUNTER — Other Ambulatory Visit (HOSPITAL_COMMUNITY): Payer: Self-pay

## 2023-12-22 ENCOUNTER — Ambulatory Visit: Payer: Self-pay

## 2023-12-22 DIAGNOSIS — I10 Essential (primary) hypertension: Secondary | ICD-10-CM

## 2023-12-22 DIAGNOSIS — E876 Hypokalemia: Secondary | ICD-10-CM

## 2023-12-22 LAB — BMP8
BUN: 11 mg/dL (ref 8–27)
CO2: 27 mmol/L (ref 20–29)
Calcium: 10 mg/dL (ref 8.6–10.2)
Chloride: 100 mmol/L (ref 96–106)
Creatinine, Ser: 0.88 mg/dL (ref 0.76–1.27)
Glucose: 257 mg/dL — ABNORMAL HIGH (ref 70–99)
Potassium: 3.3 mmol/L — ABNORMAL LOW (ref 3.5–5.2)
Sodium: 141 mmol/L (ref 134–144)
eGFR: 95 mL/min/1.73 (ref >59)

## 2023-12-22 MED ORDER — POTASSIUM CHLORIDE CRYS ER 10 MEQ PO TBCR
10.0000 meq | EXTENDED_RELEASE_TABLET | Freq: Every day | ORAL | 0 refills | Status: DC
  Filled 2023-12-22: qty 7, 7d supply, fill #0

## 2023-12-23 ENCOUNTER — Other Ambulatory Visit: Payer: Self-pay

## 2023-12-23 ENCOUNTER — Other Ambulatory Visit: Payer: Self-pay | Admitting: Nurse Practitioner

## 2023-12-23 ENCOUNTER — Other Ambulatory Visit (HOSPITAL_COMMUNITY): Payer: Self-pay

## 2023-12-23 DIAGNOSIS — E876 Hypokalemia: Secondary | ICD-10-CM

## 2023-12-26 ENCOUNTER — Other Ambulatory Visit: Payer: Self-pay

## 2023-12-26 ENCOUNTER — Telehealth: Payer: Self-pay

## 2023-12-26 NOTE — Telephone Encounter (Signed)
 Submitted application for FARXIGA to [employer] for patient assistance.   Phone: [phone]   Submitted application for TRESIBA  FLEXTOUCH to [employer] for patient assistance.   Phone: [phone]

## 2024-01-04 ENCOUNTER — Telehealth: Payer: Self-pay

## 2024-01-04 ENCOUNTER — Other Ambulatory Visit: Payer: Self-pay

## 2024-01-04 NOTE — Telephone Encounter (Signed)
 Received notification from [employer] regarding approval for TRESIBA  FLEXTOUCH. Patient assistance approved from 02/02/2024 to 01/31/2025.  Medication will ship to [facility] [address], [place], [place]  [address]  Pt ID: [id number]  Company phone: [phone]

## 2024-01-09 ENCOUNTER — Other Ambulatory Visit: Payer: Self-pay

## 2024-01-09 ENCOUNTER — Other Ambulatory Visit (HOSPITAL_COMMUNITY): Payer: Self-pay

## 2024-01-17 ENCOUNTER — Other Ambulatory Visit: Payer: Self-pay

## 2024-01-17 ENCOUNTER — Encounter: Payer: Self-pay | Admitting: Nurse Practitioner

## 2024-01-17 ENCOUNTER — Other Ambulatory Visit (HOSPITAL_COMMUNITY): Payer: Self-pay

## 2024-01-17 ENCOUNTER — Ambulatory Visit: Payer: Self-pay | Admitting: Nurse Practitioner

## 2024-01-17 VITALS — BP 130/71 | HR 74 | Wt 143.6 lb

## 2024-01-17 DIAGNOSIS — F419 Anxiety disorder, unspecified: Secondary | ICD-10-CM | POA: Insufficient documentation

## 2024-01-17 DIAGNOSIS — F32A Depression, unspecified: Secondary | ICD-10-CM | POA: Insufficient documentation

## 2024-01-17 DIAGNOSIS — I10 Essential (primary) hypertension: Secondary | ICD-10-CM

## 2024-01-17 DIAGNOSIS — R229 Localized swelling, mass and lump, unspecified: Secondary | ICD-10-CM | POA: Insufficient documentation

## 2024-01-17 DIAGNOSIS — E1169 Type 2 diabetes mellitus with other specified complication: Secondary | ICD-10-CM

## 2024-01-17 DIAGNOSIS — E876 Hypokalemia: Secondary | ICD-10-CM

## 2024-01-17 MED ORDER — TRESIBA FLEXTOUCH 100 UNIT/ML ~~LOC~~ SOPN
20.0000 [IU] | PEN_INJECTOR | Freq: Every day | SUBCUTANEOUS | 5 refills | Status: DC
  Filled 2024-01-17: qty 18, 56d supply, fill #0

## 2024-01-17 MED ORDER — DAPAGLIFLOZIN PROPANEDIOL 10 MG PO TABS: 10.0000 mg | ORAL_TABLET | Freq: Every day | ORAL | 3 refills | Status: AC

## 2024-01-17 MED ORDER — SERTRALINE HCL 50 MG PO TABS
50.0000 mg | ORAL_TABLET | Freq: Every day | ORAL | 3 refills | Status: AC
  Filled 2024-01-17: qty 60, 60d supply, fill #0

## 2024-01-17 NOTE — Assessment & Plan Note (Signed)
°    01/17/2024    2:42 PM 08/17/2023    3:22 PM 06/29/2023    1:26 PM 04/07/2022    5:04 PM  GAD 7 : Generalized Anxiety Score  Nervous, Anxious, on Edge 1 0 0 1  Control/stop worrying 2 0 0 0  Worry too much - different things 1 0 0 0  Trouble relaxing 2 0 0 0  Restless 1 0 0 1  Easily annoyed or irritable 2 0 0 0  Afraid - awful might happen 0 0 0 0  Total GAD 7 Score 9 0 0 2  Anxiety Difficulty  Not difficult at all Not difficult at all   Anxiety disorder Recent anxiety onset post-divorce. No suicidal ideation. Plans for counseling at [facility] office. - Prescribed Zoloft  for anxiety management. - Encouraged counseling services at the [facility] office.

## 2024-01-17 NOTE — Assessment & Plan Note (Addendum)
 BP Readings from Last 3 Encounters:  01/17/24 130/71  12/21/23 128/68  11/09/23 132/68   Blood pressure at 130/71 mmHg. No symptoms related to chest pain, shortness of breath, or swelling reported. - Continue current antihypertensive medications. - Encouraged heart-healthy, low-salt, low-fat diet. - Encouraged at least 30 minutes of moderate to vigorous exercise daily.

## 2024-01-17 NOTE — Progress Notes (Signed)
 Established Patient Office Visit  Subjective:  Patient ID: Darrell [name], male    DOB: Feb 26, 1955  Age: 68 y.o. MRN: [id number]  CC:  Chief Complaint  Patient presents with   Follow-up    Has a knot on the left arm, would like to have it evaluated, no pain, has not grown    Hypertension   Hyperlipidemia    HPI    Discussed the use of AI scribe software for clinical note transcription with the patient, who gave verbal consent to proceed.  History of Present Illness Darrell [name] is a 68 year old male  has a past medical history of Acute blood loss anemia (04/14/2021), Benign neoplasm of sigmoid colon, Benign prostatic hyperplasia without lower urinary tract symptoms (06/08/2017), Diverticulosis of colon with hemorrhage, Dyslipidemia, Esophageal stricture, Essential hypertension, benign, Expressive aphasia (07/14/2018), Gastroesophageal reflux disease with esophagitis without hemorrhage, History of TIA (transient ischemic attack) (04/14/2021), Hyperlipidemia, Hypertension (01/30/2015), Hypertensive emergency (07/14/2018), Prolonged QT interval (07/14/2018), RBBB, Statin intolerance (08/27/2022), 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  who presents for follow-up For this chronic medical conditions.   He takes his medications as prescribed, including Farxiga  10 mg, which was started recently after discontinuing Rybelsus  due to to morning sickness. He takes Farxiga  an hour after his other medications. He also continues metformin  1000 mg twice daily and Tresiba , which he has increased to 19 units daily. His fasting blood sugar has been around 139 mg/dL, with occasional readings as high as 160 mg/dL. He monitors his blood sugar using a CGM  He has recently experienced anxiety, which he attributes to stress  from his recent divorce. He describes feeling 'pissed' and 'frustrated' due to the timing of his ex-wife obtaining citizenship and then divorcing him. He has been praying to manage his stress and recalls previously taking Zoloft  for anxiety.  He mentions a knot on his left arm that has been present for a while but is not causing pain. It has been present for a while but is not causing pain. He had a similar issue on his back that required surgical removal.  He is currently taking a potassium 10meq tablets daily ?,. He also consumes bananas to help manage his potassium levels, which have been low recently.On olmesartan -hydrochlorothiazide  40-12.5 mg daily changed from  hydrochlorothiazide  40/25 mg once daily due to hypokalemia   No chest pain, shortness of breath, or swelling. No low blood sugar episodes recently.     Assessment & Plan      Past Medical History:  Diagnosis Date   Acute blood loss anemia 04/14/2021   Benign neoplasm of sigmoid colon    Benign prostatic hyperplasia without lower urinary tract symptoms 06/08/2017   Diverticulosis of colon with hemorrhage    Dyslipidemia    Esophageal stricture    Essential hypertension, benign    Expressive aphasia 07/14/2018   Gastroesophageal reflux disease with esophagitis without hemorrhage    History of TIA (transient ischemic attack) 04/14/2021   Hyperlipidemia    Hypertension 01/30/2015   Hypertensive emergency 07/14/2018   Prolonged QT interval 07/14/2018   RBBB    Statin intolerance 08/27/2022   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, 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] 09/24/2022   Thyroid  cyst    TIA (transient ischemic attack) 07/14/2018   Type 2 diabetes mellitus (HCC)    Type 2 diabetes mellitus with hyperlipidemia (HCC) 01/30/2015   Vitamin  D deficiency     Past Surgical History:  Procedure Laterality Date   COLONOSCOPY WITH PROPOFOL  N/A 04/15/2021   Procedure: COLONOSCOPY WITH PROPOFOL ;  Surgeon: Abran Norleen SAILOR, MD;  Location: [facility];  Service:  Gastroenterology;  Laterality: N/A;   ESOPHAGOGASTRODUODENOSCOPY (EGD) WITH PROPOFOL  N/A 04/15/2021   Procedure: ESOPHAGOGASTRODUODENOSCOPY (EGD) WITH PROPOFOL ;  Surgeon: Abran Norleen SAILOR, MD;  Location: [facility];  Service: Gastroenterology;  Laterality: N/A;   MELANOMA EXCISION     Back   POLYPECTOMY  04/15/2021   Procedure: POLYPECTOMY;  Surgeon: Abran Norleen SAILOR, MD;  Location: [facility];  Service: Gastroenterology;;   WISDOM TOOTH EXTRACTION      Family History  Problem Relation Age of Onset   Hypertension Mother    Pulmonary embolism Mother    Diabetes Father    Hypertension Father    Alzheimer's disease Father    Heart disease Brother    Alzheimer's disease Brother    Diabetes Maternal Grandmother    Colon cancer Neg Hx     Social History   Socioeconomic History   Marital status: Single    Spouse name: Not on file   Number of children: 4   Years of education: Not on file   Highest education level: Not on file  Occupational History    Employer: [employer]  Tobacco Use   Smoking status: Never    Passive exposure: Never   Smokeless tobacco: Never  Vaping Use   Vaping status: Never Used  Substance and Sexual Activity   Alcohol use: Yes    Comment: occasional   Drug use: No   Sexual activity: Not on file  Other Topics Concern   Not on file  Social History Narrative   Lives home alone   Social Drivers of Health   Tobacco Use: Low Risk (11/09/2023)   Patient History    Smoking Tobacco Use: Never    Smokeless Tobacco Use: Never    Passive Exposure: Never  Financial Resource Strain: Not on file  Food Insecurity: No Food Insecurity (08/19/2023)   Epic    Worried About Programme Researcher, Broadcasting/film/video in the Last Year: Never true    Ran Out of Food in the Last Year: Never true  Transportation Needs: No Transportation Needs (10/12/2022)   PRAPARE - Administrator, Civil Service (Medical): No    Lack of Transportation (Non-Medical): No  Physical Activity: Inactive  (10/12/2022)   Exercise Vital Sign    Days of Exercise per Week: 0 days    Minutes of Exercise per Session: 0 min  Stress: Not on file  Social Connections: Not on file  Intimate Partner Violence: Not on file  Depression (PHQ2-9): High Risk (01/17/2024)   Depression (PHQ2-9)    PHQ-2 Score: 11  Alcohol Screen: Low Risk (10/12/2022)   Alcohol Screen    Last Alcohol Screening Score (AUDIT): 4  Housing: Low Risk (10/12/2022)   Housing    Last Housing Risk Score: 0  Utilities: Not At Risk (10/12/2022)   AHC Utilities    Threatened with loss of utilities: No  Health Literacy: Not on file    Outpatient Medications Prior to Visit  Medication Sig Dispense Refill   Blood Glucose Monitoring Suppl (ACCU-CHEK AVIVA PLUS) w/Device KIT Use as directed to check blood sugar 1 kit 0   clopidogrel  (PLAVIX ) 75 MG tablet Take 1 tablet (75 mg total) by mouth daily. 90 tablet 3   Continuous Glucose Receiver (FREESTYLE LIBRE 3 READER) DEVI Use to  check blood sugar continuously 1 each 0   Continuous Glucose Sensor (FREESTYLE LIBRE 3 PLUS SENSOR) MISC Change sensor every 15 days. 6 each 3   Evolocumab  (REPATHA  SURECLICK) 140 MG/ML SOAJ Inject 140 mg into the skin every 14 (fourteen) days. 6 mL 2   Insulin  Pen Needle (PEN NEEDLES) 32G X 6 MM MISC Use as directed to inject insulin  once daily 200 each 3   metFORMIN  (GLUCOPHAGE -XR) 500 MG 24 hr tablet Take 2 tablets (1,000 mg total) by mouth 2 (two) times daily with a meal. 360 tablet 3   Multiple Vitamin (MULTIVITAMIN) tablet Take 1 tablet by mouth daily.     NIFEdipine  (PROCARDIA  XL/NIFEDICAL XL) 60 MG 24 hr tablet Take 1 tablet (60 mg total) by mouth in the morning and at bedtime. 180 tablet 2   olmesartan -hydrochlorothiazide  (BENICAR  HCT) 40-12.5 MG tablet Take 1 tablet by mouth daily. 90 tablet 3   OneTouch Delica Lancets 30G MISC Use to test blood sugar as directed 100 each 3   potassium chloride  (KLOR-CON  M) 10 MEQ tablet Take 1 tablet (10 mEq total) by  mouth daily. 7 tablet 0   dapagliflozin  propanediol (FARXIGA ) 10 MG TABS tablet Take 10 mg by mouth daily.     insulin  degludec (TRESIBA  FLEXTOUCH) 100 UNIT/ML FlexTouch Pen Inject 16 Units into the skin daily. May increase up to 32 units daily if instructed by your provider. 3 mL 5   Glucose Blood (BLOOD GLUCOSE TEST STRIPS) STRP Use to check blood sugar twice daily. May substitute to any manufacturer covered by patient's insurance. (Patient not taking: Reported on 01/17/2024) 100 strip 3   Lancet Device MISC Use to check blood sugar twice daily. May substitute to any manufacturer covered by patient's insurance. 1 each 0   No facility-administered medications prior to visit.    Allergies[1]  ROS Review of Systems  Constitutional:  Negative for appetite change, chills, fatigue and fever.  HENT:  Negative for congestion, postnasal drip, rhinorrhea and sneezing.   Respiratory:  Negative for cough, shortness of breath and wheezing.   Cardiovascular:  Negative for chest pain, palpitations and leg swelling.  Gastrointestinal:  Negative for abdominal pain, constipation, nausea and vomiting.  Genitourinary:  Negative for difficulty urinating, dysuria, flank pain and frequency.  Musculoskeletal:  Negative for arthralgias, back pain, joint swelling and myalgias.  Skin:  Negative for color change, pallor, rash and wound.  Neurological:  Negative for dizziness, facial asymmetry, weakness, numbness and headaches.  Psychiatric/Behavioral:  Negative for behavioral problems, confusion, self-injury and suicidal ideas.       Objective:    Physical Exam Vitals and nursing note reviewed.  Constitutional:      General: He is not in acute distress.    Appearance: Normal appearance. He is not ill-appearing, toxic-appearing or diaphoretic.  Eyes:     General: No scleral icterus.       Right eye: No discharge.        Left eye: No discharge.     Extraocular Movements: Extraocular movements intact.      Conjunctiva/sclera: Conjunctivae normal.  Cardiovascular:     Rate and Rhythm: Normal rate and regular rhythm.     Pulses: Normal pulses.     Heart sounds: Normal heart sounds. No murmur heard.    No friction rub. No gallop.  Pulmonary:     Effort: Pulmonary effort is normal. No respiratory distress.     Breath sounds: Normal breath sounds. No stridor. No wheezing, rhonchi or rales.  Chest:  Chest wall: No tenderness.  Abdominal:     General: There is no distension.     Palpations: Abdomen is soft.     Tenderness: There is no abdominal tenderness. There is no right CVA tenderness, left CVA tenderness or guarding.  Musculoskeletal:        General: No swelling, tenderness, deformity or signs of injury.     Right lower leg: No edema.     Left lower leg: No edema.  Skin:    General: Skin is warm and dry.     Capillary Refill: Capillary refill takes less than 2 seconds.     Coloration: Skin is not jaundiced or pale.     Findings: No bruising, erythema or lesion.  Neurological:     Mental Status: He is alert and oriented to person, place, and time.     Motor: No weakness.     Coordination: Coordination normal.     Gait: Gait normal.  Psychiatric:        Mood and Affect: Mood normal.        Behavior: Behavior normal.        Thought Content: Thought content normal.        Judgment: Judgment normal.     BP 130/71   Pulse 74   Wt 143 lb 9.6 oz (65.1 kg)   SpO2 99%   BMI 23.90 kg/m  Wt Readings from Last 3 Encounters:  01/17/24 143 lb 9.6 oz (65.1 kg)  12/21/23 141 lb (64 kg)  11/18/23 139 lb (63 kg)    Lab Results  Component Value Date   TSH 1.310 04/07/2022   Lab Results  Component Value Date   WBC 7.2 06/29/2023   HGB 13.4 06/29/2023   HCT 40.2 06/29/2023   MCV 97 06/29/2023   PLT 213 06/29/2023   Lab Results  Component Value Date   NA 141 12/21/2023   K 3.3 (L) 12/21/2023   CO2 27 12/21/2023   GLUCOSE 257 (H) 12/21/2023   BUN 11 12/21/2023   CREATININE  0.88 12/21/2023   BILITOT 0.6 08/27/2022   ALKPHOS 56 08/27/2022   AST 12 08/27/2022   ALT 10 08/27/2022   PROT 6.7 08/27/2022   ALBUMIN 4.5 08/27/2022   CALCIUM  10.0 12/21/2023   ANIONGAP 9 04/15/2021   EGFR 95 12/21/2023   Lab Results  Component Value Date   CHOL 110 04/01/2023   Lab Results  Component Value Date   HDL 55 04/01/2023   Lab Results  Component Value Date   LDLCALC 38 04/01/2023   Lab Results  Component Value Date   TRIG 88 04/01/2023   Lab Results  Component Value Date   CHOLHDL 2.0 04/01/2023   Lab Results  Component Value Date   HGBA1C 5.5 11/09/2023      Assessment & Plan:   Problem List Items Addressed This Visit       Cardiovascular and Mediastinum   Hypertension   BP Readings from Last 3 Encounters:  01/17/24 130/71  12/21/23 128/68  11/09/23 132/68   Blood pressure at 130/71 mmHg. No symptoms related to chest pain, shortness of breath, or swelling reported. - Continue current antihypertensive medications. - Encouraged heart-healthy, low-salt, low-fat diet. - Encouraged at least 30 minutes of moderate to vigorous exercise daily.         Endocrine   Type 2 diabetes mellitus with hyperlipidemia [facility])   Lab Results  Component Value Date   HGBA1C 5.5 11/09/2023    Type 2 diabetes  mellitus Recent switch from Rybelsus  to Farxiga  due to adverse effects. Improved blood sugar control with current regimen. No hypoglycemia reported. - Continue Metformin  1000 mg twice daily. - increased Tresiba  at 20 units daily. - Continue Farxiga  10 mg daily with food or spaced out. Patient counseled on low-carb diet appreciate collaboration with the clinical pharmacist  For hyperlipidemia on Repatha  140 mg every 14 days, checking direct LDL       Relevant Medications   insulin  degludec (TRESIBA  FLEXTOUCH) 100 UNIT/ML FlexTouch Pen   Other Relevant Orders   Direct LDL     Other   Hypokalemia - Primary   Lab Results  Component Value Date    NA 141 12/21/2023   K 3.3 (L) 12/21/2023   CO2 27 12/21/2023   GLUCOSE 257 (H) 12/21/2023   BUN 11 12/21/2023   CREATININE 0.88 12/21/2023   CALCIUM  10.0 12/21/2023   EGFR 95 12/21/2023   GFRNONAA >60 04/15/2021  potassium supplementation ongoing. - Checked potassium levels today. - Continue potassium supplementation as needed.       Relevant Orders   Potassium   Anxiety      01/17/2024    2:42 PM 08/17/2023    3:22 PM 06/29/2023    1:26 PM 04/07/2022    5:04 PM  GAD 7 : Generalized Anxiety Score  Nervous, Anxious, on Edge 1 0 0 1  Control/stop worrying 2 0 0 0  Worry too much - different things 1 0 0 0  Trouble relaxing 2 0 0 0  Restless 1 0 0 1  Easily annoyed or irritable 2 0 0 0  Afraid - awful might happen 0 0 0 0  Total GAD 7 Score 9 0 0 2  Anxiety Difficulty  Not difficult at all Not difficult at all   Anxiety disorder Recent anxiety onset post-divorce. No suicidal ideation. Plans for counseling at [facility] office. - Prescribed Zoloft  for anxiety management. - Encouraged counseling services at the [facility] office.        Relevant Medications   sertraline  (ZOLOFT ) 50 MG tablet   Skin mass   A mobile mass noted on left upper arm most likely a cyst  no redness or tenderness noted Advised to monitor site and report tenderness      Mild depression      01/17/2024    2:44 PM 01/17/2024    1:55 PM 08/19/2023   11:39 AM  PHQ9 SCORE ONLY  PHQ-9 Total Score 11 0 0      Data saved with a previous flowsheet row definition   Recent anxiety and depression onset post-divorce. No suicidal ideation. Plans for counseling at [facility] office. - Prescribed Zoloft  for depression management. - Encouraged counseling services at the [facility] office.      Relevant Medications   sertraline  (ZOLOFT ) 50 MG tablet    Meds ordered this encounter  Medications   sertraline  (ZOLOFT ) 50 MG tablet    Sig: Take 1 tablet (50 mg total) by mouth daily.    Dispense:  60 tablet    Refill:  3   insulin   degludec (TRESIBA  FLEXTOUCH) 100 UNIT/ML FlexTouch Pen    Sig: Inject 20 Units into the skin daily. May increase up to 32 units daily if instructed by your provider.    Dispense:  3 mL    Refill:  5    [facility] program    Follow-up: Return in about 2 months (around 03/19/2024) for ANXIETY, DEPRESSION, DM.    Fred Hammes R Joud Ingwersen,  FNP     [1]  Allergies Allergen Reactions   Statins Other (See Comments)    Joint Pain

## 2024-01-17 NOTE — Progress Notes (Signed)
 Patient presented for visit with Darrell Shall, NP today. Downloaded Freestyle Libre 3 reader:      Observations: FBG mostly at goal on Tresiba  19 units daily. Patient confirms he started Farxiga . Mainly note PM spikes (after dinner). Patient reports he has been having oreos most nights, which likely contributes to the spikes.   Plan: Patient advised to continue titrating Tresiba  by 2 units every 3 days if FBG is consistently > 130 mg/dL up to a maximum of 26 units daily. Encouraged him to reduce portion size of carbohydrate foods to reduce BG spikes, as this would greatly improved TIR.   Collaborated with PCP to place refills of Farxiga  10 mg daily to [facility] pharmacy ([facility])  Darrell [name], PharmD [facility] [phone]

## 2024-01-17 NOTE — Assessment & Plan Note (Addendum)
 Lab Results  Component Value Date   HGBA1C 5.5 11/09/2023    Type 2 diabetes mellitus Recent switch from Rybelsus  to Farxiga  due to adverse effects. Improved blood sugar control with current regimen. No hypoglycemia reported. - Continue Metformin  1000 mg twice daily. - increased Tresiba  at 20 units daily. - Continue Farxiga  10 mg daily with food or spaced out. Patient counseled on low-carb diet appreciate collaboration with the clinical pharmacist  For hyperlipidemia on Repatha  140 mg every 14 days, checking direct LDL

## 2024-01-17 NOTE — Assessment & Plan Note (Signed)
 A mobile mass noted on left upper arm most likely a cyst  no redness or tenderness noted Advised to monitor site and report tenderness

## 2024-01-17 NOTE — Patient Instructions (Signed)
 Goal for fasting blood sugar ranges from 80 to 120 and 2 hours after any meal or at bedtime should be between 130 to 170.   1. Hypokalemia (Primary) - Potassium  2. Primary hypertension  3. Mixed hyperlipidemia  4. Type 2 diabetes mellitus with hyperlipidemia (HCC) - Direct LDL - insulin  degludec (TRESIBA  FLEXTOUCH) 100 UNIT/ML FlexTouch Pen; Inject 20 Units into the skin daily. May increase up to 32 units daily if instructed by your provider.  Dispense: 3 mL; Refill: 5  5. Anxiety - sertraline  (ZOLOFT ) 50 MG tablet; Take 1 tablet (50 mg total) by mouth daily.  Dispense: 60 tablet; Refill: 3    It is important that you exercise regularly at least 30 minutes 5 times a week as tolerated  Think about what you will eat, plan ahead. Choose  clean, green, fresh or frozen over canned, processed or packaged foods which are more sugary, salty and fatty. 70 to 75% of food eaten should be vegetables and fruit. Three meals at set times with snacks allowed between meals, but they must be fruit or vegetables. Aim to eat over a 12 hour period , example 7 am to 7 pm, and STOP after  your last meal of the day. Drink water,generally about 64 ounces per day, no other drink is as healthy. Fruit juice is best enjoyed in a healthy way, by EATING the fruit.  Thanks for choosing [facility] we consider it a privelige to serve you.

## 2024-01-17 NOTE — Assessment & Plan Note (Signed)
 Lab Results  Component Value Date   NA 141 12/21/2023   K 3.3 (L) 12/21/2023   CO2 27 12/21/2023   GLUCOSE 257 (H) 12/21/2023   BUN 11 12/21/2023   CREATININE 0.88 12/21/2023   CALCIUM  10.0 12/21/2023   EGFR 95 12/21/2023   GFRNONAA >60 04/15/2021  potassium supplementation ongoing. - Checked potassium levels today. - Continue potassium supplementation as needed.

## 2024-01-17 NOTE — Assessment & Plan Note (Addendum)
°    01/17/2024    2:44 PM 01/17/2024    1:55 PM 08/19/2023   11:39 AM  PHQ9 SCORE ONLY  PHQ-9 Total Score 11 0 0      Data saved with a previous flowsheet row definition   Recent anxiety and depression onset post-divorce. No suicidal ideation. Plans for counseling at [facility] office. - Prescribed Zoloft  for depression management. - Encouraged counseling services at the [facility] office.

## 2024-01-18 ENCOUNTER — Other Ambulatory Visit: Payer: Self-pay

## 2024-01-18 ENCOUNTER — Ambulatory Visit: Payer: Self-pay | Admitting: Nurse Practitioner

## 2024-01-18 LAB — LDL CHOLESTEROL, DIRECT: LDL Direct: 27 mg/dL (ref 0–99)

## 2024-01-18 LAB — POTASSIUM: Potassium: 3.7 mmol/L (ref 3.5–5.2)

## 2024-01-29 ENCOUNTER — Other Ambulatory Visit (HOSPITAL_COMMUNITY): Payer: Self-pay

## 2024-02-13 ENCOUNTER — Other Ambulatory Visit: Payer: Self-pay

## 2024-02-14 ENCOUNTER — Other Ambulatory Visit: Payer: Self-pay

## 2024-02-14 ENCOUNTER — Other Ambulatory Visit: Payer: Self-pay | Admitting: Nurse Practitioner

## 2024-02-14 ENCOUNTER — Other Ambulatory Visit (HOSPITAL_COMMUNITY): Payer: Self-pay

## 2024-02-14 ENCOUNTER — Ambulatory Visit: Payer: Self-pay

## 2024-02-14 DIAGNOSIS — E1169 Type 2 diabetes mellitus with other specified complication: Secondary | ICD-10-CM | POA: Diagnosis not present

## 2024-02-14 DIAGNOSIS — E785 Hyperlipidemia, unspecified: Secondary | ICD-10-CM

## 2024-02-14 MED ORDER — FREESTYLE LIBRE 3 PLUS SENSOR MISC
3 refills | Status: AC
  Filled 2024-02-14: qty 6, 90d supply, fill #0

## 2024-02-14 MED ORDER — TRESIBA FLEXTOUCH 100 UNIT/ML ~~LOC~~ SOPN
20.0000 [IU] | PEN_INJECTOR | Freq: Every day | SUBCUTANEOUS | 5 refills | Status: AC
  Filled 2024-02-14: qty 15, 46d supply, fill #0
  Filled 2024-03-05: qty 30, 93d supply, fill #0

## 2024-02-14 NOTE — Patient Instructions (Signed)
 It was nice to see you today! Your A1C was 6.7% below the goal of less than 7%.   I will call you in a few weeks to review your blood sugar on your reader device. Call us  earlier if you are having frequent low blood sugars less than 70 mg/dL.  Medication Changes:  Continue all other medication the same.   If you experience symptoms of a low blood sugar (dizzy, shaky, sweaty) check your blood sugar. If it is less than 70 mg/dL, you should eat or drink 15 grams of fast-acting carbohydrates like 4 glucose tablets, 1/2 cup of regular soda, or 1/2 cup of juice. Recheck your blood sugar after 15 minutes. If the level is still below 70 mg/dL repeat the process. If this occurs frequently, you should notify your healthcare provider.   Lifestyle Recommendations:  Aim for 150 minutes of moderate intensity exercise every week. This is any activity that elevates your heart rate and breathing rate, but still allows you to carry on a conversation. Exercising after meals can help prevent spikes in your blood sugar.  Diet Recommendations:   Try to eat 3 real meals using the healthy plate method and 1-2 snacks per day. Never go more than 4-5 hours while awake without eating. Eat breakfast within the first hour of getting up.     Carbohydrates include starch, sugar, and fiber. Sugar and starch raise blood glucose.  Starchy foods include bread, rice, pasta, potatoes, corn, cereal, grits, crackers, bagels, muffins, all baked foods Some fruits are higher in sugar, like grapes, watermelon, oranges, and most tropical fruits. Limit your serving size to 1/2 cup at a time.  Protein foods include meat, fish, poultry, eggs, dairy, and beans (although beans also provide carbohydrates).  Non-starchy vegetables do not impact your blood sugar very much. These include greens, broccoli, asparagus, carrots, cauliflower, cucumber, mushrooms, peppers, yellow squash, zucchini squash, tomato, to name a few!  Avoid all sugary  beverages. Stay hydrated with water throughout the day. Sparkling/flavored water, unsweet tea, black coffee, and zero-sugar or diet drinks will not impact your blood sugar, but they should not be used as your only source of hydration!  You can find more information at https://diabetes.org/food-nutrition/eating-healthy   Lorain Baseman, PharmD [facility] [phone]

## 2024-02-14 NOTE — Progress Notes (Unsigned)
 "  02/15/2024 Name: Darrell [name] MRN: [id number] DOB: 1955/05/30  Chief Complaint  Patient presents with   Diabetes    Darrell [name] is a 69 y.o. year old male who was referred for medication management by their primary care provider, Paseda, Folashade R, FNP. They presented for a face to face visit today. He also had an appt with his PCP scheduled.   They were referred to the pharmacist by their PCP for assistance in managing diabetes  PMH includes HTN, TIA (2020), T2DM, HLD with statin myopathy, BPH.   Subjective: Patient was last seen by PCP, Lorice Shall, NP, on 01/17/24. I also reviewed his Herlene report at that time. TIR was 56%. He was instructed to titrate Tresiba  by 2 units every 3 days up to a maximum up to 26 units daily. Hypokalemia was resolved on lower dose of olmesartan -hydrochlorothiazide  40-12.5 mg daily.  Today, patient presents in  good spirits and presents with daughter. He did not bring his blood glucose monitor as he left it at church about a week ago. He continues to inject 20 units of Tresiba  as he feel this is a safe dose for him. Although he hasn't been checking his blood sugars, he seems to have experienced a hypoglycemic episode yesterday (1/12) and felt a little uneasy but not nauseaus. He felt better after eating and taking one glucose tablet. When asked about any high or low BG readings, he reported one morning reading around 60-65 mg/dL since his last PCP visit. He says his highest BG readings after meals is 157 mg/dL.    Care Team: Primary Care Provider: Paseda, Folashade R, FNP ; Next Scheduled Visit: 03/19/24   Medication Access/Adherence  Current Pharmacy:  [facility] - [facility] [address] [place] [address] Phone: [phone] Fax: [phone]  [facility] - [place], [place] - [address] [place] [address] Phone: [phone] Fax: [phone]   Patient reports affordability concerns with their  medications: Yes  Patient reports access/transportation concerns to their pharmacy: Yes  - typically gets medications mailed to him Patient reports adherence concerns with their medications:  No  - has had issues with adherence in the past, but currently appears adherent.   Diabetes:   Current medications: Farxiga  10 mg daily, Tresiba  20 units daily, metformin  1000 mg BID  Previously tried: Rybelsus  (weight loss)   Current glucose readings:  Couldn't assess at this visit, patient left glucose meter at church Notes he was still having some post-prandial spikes but fasting BG were improved.   Patient reports hypoglycemic s/sx including feeling uneasy yesterday, but did not check BG. Treated with 1 glucose tab and snack.   Denies s/sx of hyperglycemia including nocturia, neuropathy, blurred vision.   Current meal patterns: Trying eat 3 meals/day. Appetite has increased since stopping Rybelsus . Now eating his full meal  Reports meals consists of fruits (cherries, grapes) and vegetables (carrots, vegetables). For protein - eats black beans, pinto beans.  - Drinks: water, sparking water, occasional diet drink   Current physical activity: walks his dog, chair exercises at home  Current medication access support:  - Novo Nordisk PAP - Tresiba  - expires 01/31/25 - [place]&Me Farxiga  PAP - expires 01/31/25   Hypertension:  Current medications: olmesartan -hydrochlorothiazide  40-12.5 mg daily  Medications previously tried: hydrochlorothiazide  25 mg daily - hypokalemia  Patient has a validated, automated, upper arm home BP cuff Current blood pressure readings readings: did not discuss today  Hyperlipidemia/ASCVD Risk Reduction  Current lipid lowering medications: Repatha  140 mg subcutaneous every 14 days Medications tried in the past: statins (myopathy)  Antiplatelet regimen: clopidogrel  75 mg daily  ASCVD History: TIA  Risk Factors: T2DM, HTN, HLD  Current medication access support:  -  Repatha  - [employer]  - expires 11/26/24  Clinical ASCVD: Yes  The ASCVD Risk score (Arnett DK, et al., 2019) failed to calculate for the following reasons:   Risk score cannot be calculated because patient has a medical history suggesting prior/existing ASCVD   * - Cholesterol units were assumed    Objective:  BP Readings from Last 3 Encounters:  01/17/24 130/71  12/21/23 128/68  11/09/23 132/68    Lab Results  Component Value Date   HGBA1C 6.7 02/15/2024   HGBA1C 5.5 11/09/2023   HGBA1C 10.7 (A) 06/29/2023       Latest Ref Rng & Units 01/17/2024    2:56 PM 12/21/2023    2:06 PM 11/09/2023    3:46 PM  BMP  Glucose 70 - 99 mg/dL  742  806   BUN 8 - 27 mg/dL  11  18   Creatinine 9.23 - 1.27 mg/dL  9.11  9.08   BUN/Creat Ratio 10 - 24   20   Sodium 134 - 144 mmol/L  141  143   Potassium 3.5 - 5.2 mmol/L 3.7  3.3  3.2   Chloride 96 - 106 mmol/L  100  102   CO2 20 - 29 mmol/L  27  22   Calcium  8.6 - 10.2 mg/dL  89.9  9.0     Lab Results  Component Value Date   CHOL 110 04/01/2023   HDL 55 04/01/2023   LDLCALC 38 04/01/2023   LDLDIRECT 27 01/17/2024   TRIG 88 04/01/2023   CHOLHDL 2.0 04/01/2023    Medications Reviewed Today     Reviewed by Brinda Lorain SQUIBB, RPH-CPP (Pharmacist) on 02/14/24 at 1656  Med List Status: <None>   Medication Order Taking? Sig Documenting Provider Last Dose Status Informant  Blood Glucose Monitoring Suppl (ACCU-CHEK AVIVA PLUS) w/Device KIT [id number]  Use as directed to check blood sugar Paseda, Folashade R, FNP  Active   clopidogrel  (PLAVIX ) 75 MG tablet [id number] Yes Take 1 tablet (75 mg total) by mouth daily. Paseda, Folashade R, FNP  Active   Continuous Glucose Receiver (FREESTYLE LIBRE 3 READER) DEVI [id number] Yes Use to check blood sugar continuously Paseda, Folashade R, FNP  Active   Continuous Glucose Sensor (FREESTYLE LIBRE 3 PLUS SENSOR) MISC [id number] Yes Change sensor every 15 days. Paseda, Folashade R, FNP  Active   dapagliflozin   propanediol (FARXIGA ) 10 MG TABS tablet [id number] Yes Take 1 tablet (10 mg total) by mouth daily. Paseda, Folashade R, FNP  Active   Evolocumab  (REPATHA  SURECLICK) 140 MG/ML SOAJ [id number] Yes Inject 140 mg into the skin every 14 (fourteen) days. Paseda, Folashade R, FNP  Active   Glucose Blood (BLOOD GLUCOSE TEST STRIPS) STRP [id number]  Use to check blood sugar twice daily. May substitute to any manufacturer covered by patient's insurance.  Patient not taking: Reported on 01/17/2024   Paseda, Folashade R, FNP  Active   insulin  degludec (TRESIBA  FLEXTOUCH) 100 UNIT/ML FlexTouch Pen [id number]  Inject 20 Units into the skin daily. May increase up to 32 units daily if instructed by your provider. Paseda, Folashade R, FNP  Active   Insulin  Pen Needle (PEN NEEDLES) 32G X 6 MM MISC [id number]  Use as directed to inject insulin   once daily Paseda, Folashade R, FNP  Active   Lancet Device MISC [id number]  Use to check blood sugar twice daily. May substitute to any manufacturer covered by patient's insurance. Paseda, Folashade R, FNP  Active   metFORMIN  (GLUCOPHAGE -XR) 500 MG 24 hr tablet [id number] Yes Take 2 tablets (1,000 mg total) by mouth 2 (two) times daily with a meal. Paseda, Folashade R, FNP  Active   Multiple Vitamin (MULTIVITAMIN) tablet [id number]  Take 1 tablet by mouth daily. [provider]  Active Self  NIFEdipine  (PROCARDIA  XL/NIFEDICAL XL) 60 MG 24 hr tablet [id number] Yes Take 1 tablet (60 mg total) by mouth in the morning and at bedtime. Paseda, Folashade R, FNP  Active   olmesartan -hydrochlorothiazide  (BENICAR  HCT) 40-12.5 MG tablet [id number] Yes Take 1 tablet by mouth daily. Paseda, Folashade R, FNP  Active   OneTouch Delica Lancets 30G [place] [id number]  Use to test blood sugar as directed Paseda, Folashade R, FNP  Active     Discontinued 02/14/24 1336 (Completed Course)   sertraline  (ZOLOFT ) 50 MG tablet [id number] Yes Take 1 tablet (50 mg total) by mouth daily. Paseda, Folashade R, FNP   Active               Assessment/Plan:   Diabetes: - Currently controlled with A1C today of 6.7% below goal < 7%, however, unable to review CGM report. Patient is tolerating SGLT2i well. If additional glycemic control is needed in the future, could consider pursuing Trulicity  through [facility], as this is a less potent GLP-1RA and may be better tolerated with Rybelsus  - Last UACR Feb 2025 - 23 mg/g - Reviewed long term cardiovascular and renal outcomes of uncontrolled blood sugar - Reviewed goal A1c, goal fasting, and goal 2 hour post prandial glucose - Reviewed hypoglycemia management plan and the rule of 15 - Reviewed dietary modifications including  utilizing the healthy plate method, limiting portion size of carbohydrate foods, increasing intake of protein and non-starchy vegetables. Counseled patient to stay hydrated with water throughout the day. - Reviewed lifestyle modifications including: aiming for 150 minutes of moderate intensity exercise every week.  - Recommend to continue insulin  degludec (Tresiba ) 20 units daily, metformin  XR 1000 mg BID, Farxiga  10 mg daily - Obtained signature for [employer] application for Trulicity , in case we need to initiate in the future - Recommend to check glucose continuously with FL3 CGM and reader. Patient not interested in connecting to his phone at this time. Counseled patient to bring glucometer or BG log to every appointment. - Next A1C due 05/14/24   Hyperlipidemia/ASCVD Risk Reduction: - Currently controlled with most recent LDL-C of 39 mg/dL below goal < 70 mg/dL given hx of TIA + U7IF. Patient tolerating Repatha  well.  - Reviewed long term complications of uncontrolled cholesterol - Recommend to continue Repatha  140 mg Ferrelview every 14 days    Written patient instructions provided. Patient verbalized understanding of treatment plan.   Will follow-up on Medicare LIS application (additional information needed) - Reentry # for application  is : [id number].   Follow Up Plan:  Pharmacist telephone 03/23/24 PCP clinic visit 03/20/23   Lorain Baseman, PharmD [facility] [phone]   "

## 2024-02-14 NOTE — Progress Notes (Deleted)
 Darrell [name]

## 2024-02-15 ENCOUNTER — Other Ambulatory Visit (HOSPITAL_COMMUNITY): Payer: Self-pay

## 2024-02-15 LAB — POCT GLYCOSYLATED HEMOGLOBIN (HGB A1C): HbA1c, POC (controlled diabetic range): 6.7 % (ref 0.0–7.0)

## 2024-02-15 MED ORDER — FREESTYLE LIBRE 3 PLUS SENSOR MISC
3 refills | Status: AC
  Filled 2024-02-15: qty 6, 90d supply, fill #0

## 2024-02-15 NOTE — Telephone Encounter (Signed)
 Continuous Glucose Sensor (FREESTYLE LIBRE 3 PLUS SENSOR) MISC      The original prescription was reordered on 02/14/2024 by Brinda Lorain SQUIBB, RPH-CPP. Renewing this prescription may not be appropriate.

## 2024-02-16 ENCOUNTER — Other Ambulatory Visit (HOSPITAL_COMMUNITY): Payer: Self-pay

## 2024-02-27 ENCOUNTER — Other Ambulatory Visit: Payer: Self-pay

## 2024-03-05 ENCOUNTER — Other Ambulatory Visit: Payer: Self-pay

## 2024-03-19 ENCOUNTER — Ambulatory Visit: Payer: Self-pay | Admitting: Nurse Practitioner

## 2024-03-23 ENCOUNTER — Other Ambulatory Visit

## 2024-04-17 ENCOUNTER — Ambulatory Visit: Payer: Self-pay
